# Patient Record
Sex: Male | Born: 1965 | Race: Black or African American | Hispanic: No | Marital: Married | Smoking: Never smoker
Health system: Southern US, Community
[De-identification: ages and names within clinical notes are randomized; demographics above are authoritative.]

## PROBLEM LIST (undated history)

## (undated) DIAGNOSIS — G4730 Sleep apnea, unspecified: Secondary | ICD-10-CM

## (undated) DIAGNOSIS — G4733 Obstructive sleep apnea (adult) (pediatric): Secondary | ICD-10-CM

## (undated) DIAGNOSIS — I10 Essential (primary) hypertension: Secondary | ICD-10-CM

## (undated) DIAGNOSIS — R519 Headache, unspecified: Secondary | ICD-10-CM

## (undated) DIAGNOSIS — M1990 Unspecified osteoarthritis, unspecified site: Secondary | ICD-10-CM

## (undated) DIAGNOSIS — R51 Headache: Secondary | ICD-10-CM

## (undated) DIAGNOSIS — Z9989 Dependence on other enabling machines and devices: Secondary | ICD-10-CM

## (undated) DIAGNOSIS — N189 Chronic kidney disease, unspecified: Secondary | ICD-10-CM

## (undated) DIAGNOSIS — E119 Type 2 diabetes mellitus without complications: Secondary | ICD-10-CM

## (undated) HISTORY — DX: Headache: R51

## (undated) HISTORY — DX: Sleep apnea, unspecified: G47.30

## (undated) HISTORY — DX: Morbid (severe) obesity due to excess calories: E66.01

## (undated) HISTORY — DX: Type 2 diabetes mellitus without complications: E11.9

## (undated) HISTORY — PX: VASECTOMY: SHX75

## (undated) HISTORY — PX: MENISCUS REPAIR: SHX5179

## (undated) HISTORY — DX: Essential (primary) hypertension: I10

## (undated) HISTORY — DX: Headache, unspecified: R51.9

## (undated) HISTORY — DX: Obstructive sleep apnea (adult) (pediatric): G47.33

## (undated) HISTORY — DX: Chronic kidney disease, unspecified: N18.9

## (undated) HISTORY — DX: Dependence on other enabling machines and devices: Z99.89

## (undated) HISTORY — PX: OTHER SURGICAL HISTORY: SHX169

---

## 2011-09-05 DIAGNOSIS — G4733 Obstructive sleep apnea (adult) (pediatric): Secondary | ICD-10-CM

## 2011-09-05 HISTORY — DX: Obstructive sleep apnea (adult) (pediatric): G47.33

## 2014-07-14 ENCOUNTER — Encounter (INDEPENDENT_AMBULATORY_CARE_PROVIDER_SITE_OTHER): Payer: Self-pay

## 2014-07-14 ENCOUNTER — Ambulatory Visit (INDEPENDENT_AMBULATORY_CARE_PROVIDER_SITE_OTHER): Payer: BC Managed Care – PPO | Admitting: Internal Medicine

## 2014-07-14 ENCOUNTER — Encounter: Payer: Self-pay | Admitting: Internal Medicine

## 2014-07-14 DIAGNOSIS — I10 Essential (primary) hypertension: Secondary | ICD-10-CM

## 2014-07-14 DIAGNOSIS — Z13220 Encounter for screening for lipoid disorders: Secondary | ICD-10-CM

## 2014-07-14 HISTORY — DX: Morbid (severe) obesity due to excess calories: E66.01

## 2014-07-14 LAB — COMPREHENSIVE METABOLIC PANEL
ALBUMIN: 3.4 g/dL — AB (ref 3.5–5.2)
ALT: 43 U/L (ref 0–53)
AST: 31 U/L (ref 0–37)
Alkaline Phosphatase: 75 U/L (ref 39–117)
BUN: 15 mg/dL (ref 6–23)
CALCIUM: 9.7 mg/dL (ref 8.4–10.5)
CHLORIDE: 104 meq/L (ref 96–112)
CO2: 31 mEq/L (ref 19–32)
Creatinine, Ser: 1.3 mg/dL (ref 0.4–1.5)
GFR: 78.54 mL/min (ref >60.00)
GLUCOSE: 161 mg/dL — AB (ref 70–99)
POTASSIUM: 4.1 meq/L (ref 3.5–5.1)
Sodium: 141 mEq/L (ref 135–145)
TOTAL PROTEIN: 7.7 g/dL (ref 6.0–8.3)
Total Bilirubin: 0.4 mg/dL (ref 0.2–1.2)

## 2014-07-14 LAB — CBC
HCT: 44.3 % (ref 39.0–52.0)
Hemoglobin: 14.5 g/dL (ref 13.0–17.0)
MCHC: 32.7 g/dL (ref 30.0–36.0)
MCV: 86.2 fl (ref 78.0–100.0)
PLATELETS: 154 10*3/uL (ref 150.0–400.0)
RBC: 5.14 Mil/uL (ref 4.22–5.81)
RDW: 16.1 % — ABNORMAL HIGH (ref 11.5–15.5)
WBC: 6.4 10*3/uL (ref 4.0–10.5)

## 2014-07-14 LAB — TSH: TSH: 2.65 u[IU]/mL (ref 0.35–4.50)

## 2014-07-14 LAB — LIPID PANEL
CHOLESTEROL: 140 mg/dL (ref 0–200)
HDL: 38.6 mg/dL — ABNORMAL LOW (ref >39.00)
LDL Cholesterol: 88 mg/dL (ref 0–99)
NONHDL: 101.4
Total CHOL/HDL Ratio: 4
Triglycerides: 67 mg/dL (ref 0.0–149.0)
VLDL: 13.4 mg/dL (ref 0.0–40.0)

## 2014-07-14 LAB — HEMOGLOBIN A1C: Hgb A1c MFr Bld: 7.8 % — ABNORMAL HIGH (ref 4.6–6.5)

## 2014-07-14 NOTE — Progress Notes (Signed)
Pre visit review using our clinic review tool, if applicable. No additional management support is needed unless otherwise documented below in the visit note. 

## 2014-07-14 NOTE — Assessment & Plan Note (Addendum)
Encouraged him to try water aerobics to get a good workout without complicating his knee pain Will check A1C and TSH today

## 2014-07-14 NOTE — Progress Notes (Signed)
HPI  Pt presents to the clinic today to establish care. He recently moved from [place].  Flu: never Tetanus: 2009 Vision Screening: yearly- 12/2013 Dentist: Every 4 months  Sleep Apnea: He does have a CPAP machine. He does think it is due to be serviced because the mask is not fitting as well as it had previous. He is not sleeping as well. He has not established care with a pulmonologist in the area.  HTN: BP is very elevated today. He reports he does not check his blood pressure at home but does have a brand new monitor. He does occasionally have headaches, but reports these occur in the afternoon and he thinks they are stress related. He is taking his Hyzaar as directed.  Left knee pain: This has been a chronic issue for him. He was being seen by orthopedics in the past and getting steroid injections every three months. He has not yet established with a orthopedic doctor in the area.  Past Medical History  Diagnosis Date  . Chicken pox   . Frequent headaches   . Hypertension   . Sleep apnea     Current Outpatient Prescriptions  Medication Sig Dispense Refill  . losartan-hydrochlorothiazide (HYZAAR) 100-25 MG per tablet Take 1 tablet by mouth daily.    . Multiple Vitamin (MULTIVITAMIN) tablet Take 1 tablet by mouth daily.    . Omega-3 Fatty Acids (FISH OIL) 1000 MG CAPS Take 1 capsule by mouth daily.     No current facility-administered medications for this visit.    No Known Allergies  Family History  Problem Relation Age of Onset  . Hypertension Mother   . Diabetes Mother   . Cancer Maternal Grandmother     Lung  . Stroke Neg Hx     History   Social History  . Marital Status: Married    Spouse Name: N/A    Number of Children: N/A  . Years of Education: N/A   Occupational History  . Not on file.   Social History Main Topics  . Smoking status: Never Smoker   . Smokeless tobacco: Never Used  . Alcohol Use: 0.0 oz/week    0 Not specified per week     Comment: never   . Drug Use: No  . Sexual Activity: Yes   Other Topics Concern  . Not on file   Social History Narrative  . No narrative on file    ROS:  Constitutional: Denies fever, malaise, fatigue, headache or abrupt weight changes.  Respiratory: Denies difficulty breathing, shortness of breath, cough or sputum production.   Cardiovascular: Denies chest pain, chest tightness, palpitations or swelling in the hands or feet.  Musculoskeletal: Pt reports left knee pain. Denies decrease in range of motion, difficulty with gait, muscle pain or joint swelling.   No other specific complaints in a complete review of systems (except as listed in HPI above).  PE:  BP 164/100 mmHg  Pulse 72  Temp(Src) 98.2 F (36.8 C) (Oral)  Ht 5' 5.5" (1.664 m)  Wt 330 lb (149.687 kg)  BMI 54.06 kg/m2  SpO2 98% Wt Readings from Last 3 Encounters:  07/14/14 330 lb (149.687 kg)    General: Appears his stated age, obese but well developed, well nourished in NAD. Cardiovascular: Normal rate and rhythm. Diminished S1,S2 noted.  No murmur, rubs or gallops noted.  Pulmonary/Chest: Normal effort and positive vesicular breath sounds. No respiratory distress. No wheezes, rales or ronchi noted.  Musculoskeletal: Normal flexion and extension of the  left knee. No instability noted. Strength 5/5 BLE. No difficulty with gait.    Assessment and Plan:  Health Maintenance:  He declines flu vaccine today Will check lipid profile today  Left knee pain:  ? Arthritis Worse due to size Encouraged him to work on weight loss Will request records from previous orthopedic in preparation to refer him to an orthopedic in the area.

## 2014-07-14 NOTE — Assessment & Plan Note (Signed)
Uncontrolled Worse due to weight Will check CBC and CMET today If normal will add CCB to Hyzaar

## 2014-07-14 NOTE — Patient Instructions (Signed)

## 2014-07-15 ENCOUNTER — Telehealth: Payer: Self-pay | Admitting: Internal Medicine

## 2014-07-15 NOTE — Telephone Encounter (Signed)
emmi emailed °

## 2014-07-21 ENCOUNTER — Encounter: Payer: Self-pay | Admitting: Internal Medicine

## 2014-07-21 ENCOUNTER — Ambulatory Visit (INDEPENDENT_AMBULATORY_CARE_PROVIDER_SITE_OTHER): Payer: BC Managed Care – PPO | Admitting: Internal Medicine

## 2014-07-21 VITALS — BP 136/84 | HR 79 | Temp 98.0°F | Wt 332.0 lb

## 2014-07-21 DIAGNOSIS — E119 Type 2 diabetes mellitus without complications: Secondary | ICD-10-CM

## 2014-07-21 HISTORY — DX: Type 2 diabetes mellitus without complications: E11.9

## 2014-07-21 NOTE — Progress Notes (Signed)
Subjective:    Patient ID: Devon Rios, male    DOB: 1965-09-16, 48 y.o.   MRN: [id number]  HPI  Pt presents to the clinic today to discuss treatment of DM2. He was recently diagnosed with an A1C of 7.8%. He does have a family history of DM2 but has never been told that he has diabetes. His is obese and has HTN. Cholesterol is diet controlled with a LDL of 88.  He reports that since he moved here, his diet had not been good. He has also has been drinking more beer because of the stress of moving.  Review of Systems  Past Medical History  Diagnosis Date  . Chicken pox   . Frequent headaches   . Hypertension   . Sleep apnea     Current Outpatient Prescriptions  Medication Sig Dispense Refill  . losartan-hydrochlorothiazide (HYZAAR) 100-25 MG per tablet Take 1 tablet by mouth daily.    . Multiple Vitamin (MULTIVITAMIN) tablet Take 1 tablet by mouth daily.    . Omega-3 Fatty Acids (FISH OIL) 1000 MG CAPS Take 1 capsule by mouth daily.     No current facility-administered medications for this visit.    No Known Allergies  Family History  Problem Relation Age of Onset  . Hypertension Mother   . Diabetes Mother   . Cancer Maternal Grandmother     Lung  . Stroke Neg Hx     History   Social History  . Marital Status: Married    Spouse Name: N/A    Number of Children: N/A  . Years of Education: N/A   Occupational History  . Not on file.   Social History Main Topics  . Smoking status: Never Smoker   . Smokeless tobacco: Never Used  . Alcohol Use: 0.0 oz/week    0 Not specified per week     Comment: never  . Drug Use: No  . Sexual Activity: Yes   Other Topics Concern  . Not on file   Social History Narrative     Constitutional: Denies fever, malaise, fatigue, headache or abrupt weight changes.  Respiratory: Denies difficulty breathing, shortness of breath, cough or sputum production.   Cardiovascular: Denies chest pain, chest tightness, palpitations or  swelling in the hands or feet.  Gastrointestinal: Denies polydipsia. Denies abdominal pain, bloating, constipation, diarrhea or blood in the stool.  GU: Denies polyuria. Denies urgency, frequency, pain with urination, burning sensation, blood in urine, odor or discharge. Skin: Denies redness, rashes, lesions or ulcercations.   No other specific complaints in a complete review of systems (except as listed in HPI above).     Objective:   Physical Exam  BP 136/84 mmHg  Pulse 79  Temp(Src) 98 F (36.7 C) (Oral)  Wt 332 lb (150.594 kg)  SpO2 98% Wt Readings from Last 3 Encounters:  07/21/14 332 lb (150.594 kg)  07/14/14 330 lb (149.687 kg)    General: Appears his stated age, obese but well developed, well nourished in NAD. Skin: Warm, dry and intact. No rashes, lesions or ulcerations noted. Cardiovascular: Normal rate and rhythm. S1,S2 noted.  No murmur, rubs or gallops noted.  Pulmonary/Chest: Normal effort and positive vesicular breath sounds. No respiratory distress. No wheezes, rales or ronchi noted.  Neurological: Alert and oriented.      BMET    Component Value Date/Time   NA 141 07/14/2014 1011   K 4.1 07/14/2014 1011   CL 104 07/14/2014 1011   CO2 31 07/14/2014 1011  GLUCOSE 161* 07/14/2014 1011   BUN 15 07/14/2014 1011   CREATININE 1.3 07/14/2014 1011   CALCIUM 9.7 07/14/2014 1011    Lipid Panel     Component Value Date/Time   CHOL 140 07/14/2014 1011   TRIG 67.0 07/14/2014 1011   HDL 38.60* 07/14/2014 1011   CHOLHDL 4 07/14/2014 1011   VLDL 13.4 07/14/2014 1011   LDLCALC 88 07/14/2014 1011    CBC    Component Value Date/Time   WBC 6.4 07/14/2014 1011   RBC 5.14 07/14/2014 1011   HGB 14.5 07/14/2014 1011   HCT 44.3 07/14/2014 1011   PLT 154.0 07/14/2014 1011   MCV 86.2 07/14/2014 1011   MCHC 32.7 07/14/2014 1011   RDW 16.1* 07/14/2014 1011    Hgb A1C Lab Results  Component Value Date   HGBA1C 7.8* 07/14/2014         Assessment & Plan:

## 2014-07-21 NOTE — Patient Instructions (Signed)
Diabetes and Standards of Medical Care Diabetes is complicated. You may find that your diabetes team includes a dietitian, nurse, diabetes educator, eye doctor, and more. To help everyone know what is going on and to help you get the care you deserve, the following schedule of care was developed to help keep you on track. Below are the tests, exams, vaccines, medicines, education, and plans you will need. HbA1c test This test shows how well you have controlled your glucose over the past 2-3 months. It is used to see if your diabetes management plan needs to be adjusted.   It is performed at least 2 times a year if you are meeting treatment goals.  It is performed 4 times a year if therapy has changed or if you are not meeting treatment goals. Blood pressure test  This test is performed at every routine medical visit. The goal is less than 140/90 mm Hg for most people, but 130/80 mm Hg in some cases. Ask your health care provider about your goal. Dental exam  Follow up with the dentist regularly. Eye exam  If you are diagnosed with type 1 diabetes as a child, get an exam upon reaching the age of 37 years or older and have had diabetes for 3-5 years. Yearly eye exams are recommended after that initial eye exam.  If you are diagnosed with type 1 diabetes as an adult, get an exam within 5 years of diagnosis and then yearly.  If you are diagnosed with type 2 diabetes, get an exam as soon as possible after the diagnosis and then yearly. Foot care exam  Visual foot exams are performed at every routine medical visit. The exams check for cuts, injuries, or other problems with the feet.  A comprehensive foot exam should be done yearly. This includes visual inspection as well as assessing foot pulses and testing for loss of sensation.  Check your feet nightly for cuts, injuries, or other problems with your feet. Tell your health care provider if anything is not healing. Kidney function test (urine  microalbumin)  This test is performed once a year.  Type 1 diabetes: The first test is performed 5 years after diagnosis.  Type 2 diabetes: The first test is performed at the time of diagnosis.  A serum creatinine and estimated glomerular filtration rate (eGFR) test is done once a year to assess the level of chronic kidney disease (CKD), if present. Lipid profile (cholesterol, HDL, LDL, triglycerides)  Performed every 5 years for most people.  The goal for LDL is less than 100 mg/dL. If you are at high risk, the goal is less than 70 mg/dL.  The goal for HDL is 40 mg/dL-50 mg/dL for men and 50 mg/dL-60 mg/dL for women. An HDL cholesterol of 60 mg/dL or higher gives some protection against heart disease.  The goal for triglycerides is less than 150 mg/dL. Influenza vaccine, pneumococcal vaccine, and hepatitis B vaccine  The influenza vaccine is recommended yearly.  It is recommended that people with diabetes who are over 24 years old get the pneumonia vaccine. In some cases, two separate shots may be given. Ask your health care provider if your pneumonia vaccination is up to date.  The hepatitis B vaccine is also recommended for adults with diabetes. Diabetes self-management education  Education is recommended at diagnosis and ongoing as needed. Treatment plan  Your treatment plan is reviewed at every medical visit. Document Released: 06/18/2009 Document Revised: 01/05/2014 Document Reviewed: 01/21/2013 [facility] Patient Information 2015 [place],  [employer]. This information is not intended to replace advice given to you by your health care provider. Make sure you discuss any questions you have with your health care provider.  

## 2014-07-21 NOTE — Assessment & Plan Note (Addendum)
A1C 7.8 Foot exam today Discussed starting medication versus lifestyle changes He would like to try 3 months of lifestyle modification first Discussed foods to avoid, how exercise can lower your blood sugars Handout given on Diabetes and standards of medical care  RTC in 3 months to reevaluate, A1C and microalbumin 1 week prior to appt

## 2014-07-21 NOTE — Progress Notes (Signed)
Pre visit review using our clinic review tool, if applicable. No additional management support is needed unless otherwise documented below in the visit note. 

## 2014-09-10 ENCOUNTER — Ambulatory Visit: Payer: Self-pay | Admitting: Internal Medicine

## 2014-09-11 ENCOUNTER — Ambulatory Visit (INDEPENDENT_AMBULATORY_CARE_PROVIDER_SITE_OTHER): Payer: BLUE CROSS/BLUE SHIELD | Admitting: Family Medicine

## 2014-09-11 ENCOUNTER — Encounter: Payer: Self-pay | Admitting: Family Medicine

## 2014-09-11 VITALS — BP 148/92 | HR 98 | Temp 98.8°F | Wt 338.5 lb

## 2014-09-11 DIAGNOSIS — J019 Acute sinusitis, unspecified: Secondary | ICD-10-CM

## 2014-09-11 DIAGNOSIS — H66001 Acute suppurative otitis media without spontaneous rupture of ear drum, right ear: Secondary | ICD-10-CM

## 2014-09-11 DIAGNOSIS — H6691 Otitis media, unspecified, right ear: Secondary | ICD-10-CM | POA: Insufficient documentation

## 2014-09-11 DIAGNOSIS — Z9989 Dependence on other enabling machines and devices: Secondary | ICD-10-CM

## 2014-09-11 DIAGNOSIS — G4733 Obstructive sleep apnea (adult) (pediatric): Secondary | ICD-10-CM | POA: Insufficient documentation

## 2014-09-11 MED ORDER — AMOXICILLIN 500 MG PO CAPS: 1000.0000 mg | ORAL_CAPSULE | Freq: Two times a day (BID) | ORAL | Status: DC

## 2014-09-11 MED ORDER — GUAIFENESIN-CODEINE 100-10 MG/5ML PO SYRP: 5.0000 mL | ORAL_SOLUTION | Freq: Every evening | ORAL | Status: DC | PRN

## 2014-09-11 NOTE — Progress Notes (Signed)
Pre visit review using our clinic review tool, if applicable. No additional management support is needed unless otherwise documented below in the visit note. 

## 2014-09-11 NOTE — Assessment & Plan Note (Addendum)
Anticipate viral but with R AOM will cover with amoxicillin. Pt agrees with plan. Supportive care as per instructions.  Use mucinex and cheratussin. Update if not improving with abx course. Significant comorbidities - morbid obesity, OSA, DM.

## 2014-09-11 NOTE — Progress Notes (Signed)
BP 148/92 mmHg  Pulse 98  Temp(Src) 98.8 F (37.1 C) (Oral)  Wt 338 lb 8 oz (153.543 kg)  SpO2 96%   CC: cough  Subjective:    Patient ID: Devon Rios, male    DOB: Jun 27, 1966, 49 y.o.   MRN: [id number]  HPI: Devon Rios is a 49 y.o. male presenting on 09/11/2014 for Sinusitis   6d h/o head congestion with cough, progressively worsening over the past week.  Endorses sinus pressure, worsening productive cough of thick yellow mucous, trouble sleeping 2/2 cough. Raspy voice. Rhinorrhea, significant ear pressure R>L.  No fevers/chills, body aches, sore throat, tooth pain, abd pain, dyspnea/wheezing or chest pain.   No sick contacts at home. + sick contacts at work. No smokers at home.  No h/o asthma, COPD. + OSA, on CPAP So far has tried dayquil, advil cold and sinus  H/o morbid obesity, HTN ,OSA, T2DM.  Relevant past medical, surgical, family and social history reviewed and updated as indicated. Interim medical history since our last visit reviewed. Allergies and medications reviewed and updated. Current Outpatient Prescriptions on File Prior to Visit  Medication Sig  . losartan-hydrochlorothiazide (HYZAAR) 100-25 MG per tablet Take 1 tablet by mouth daily.  . Multiple Vitamin (MULTIVITAMIN) tablet Take 1 tablet by mouth daily.  . Omega-3 Fatty Acids (FISH OIL) 1000 MG CAPS Take 1 capsule by mouth daily.   No current facility-administered medications on file prior to visit.    Review of Systems Per HPI unless specifically indicated above     Objective:    BP 148/92 mmHg  Pulse 98  Temp(Src) 98.8 F (37.1 C) (Oral)  Wt 338 lb 8 oz (153.543 kg)  SpO2 96%  Wt Readings from Last 3 Encounters:  09/11/14 338 lb 8 oz (153.543 kg)  07/21/14 332 lb (150.594 kg)  07/14/14 330 lb (149.687 kg)    Physical Exam  Constitutional: He appears well-developed and well-nourished. No distress.  Morbidly obese  HENT:  Head: Normocephalic and atraumatic.  Right Ear: Hearing,  external ear and ear canal normal.  Left Ear: Hearing, external ear and ear canal normal.  Nose: Mucosal edema (L>R) present. No rhinorrhea. Right sinus exhibits maxillary sinus tenderness. Right sinus exhibits no frontal sinus tenderness. Left sinus exhibits maxillary sinus tenderness. Left sinus exhibits no frontal sinus tenderness.  Mouth/Throat: Uvula is midline, oropharynx is clear and moist and mucous membranes are normal. No oropharyngeal exudate, posterior oropharyngeal edema, posterior oropharyngeal erythema or tonsillar abscesses.  Injected L TM R TM erythematous and bulging, loss of light reflex, yellow fluid behind TM  Eyes: Conjunctivae and EOM are normal. Pupils are equal, round, and reactive to light. No scleral icterus.  Neck: Normal range of motion. Neck supple.  Cardiovascular: Normal rate, regular rhythm, normal heart sounds and intact distal pulses.   No murmur heard. Pulmonary/Chest: Effort normal and breath sounds normal. No respiratory distress. He has no wheezes. He has no rales.  Lymphadenopathy:    He has no cervical adenopathy.  Skin: Skin is warm and dry. No rash noted.  Nursing note and vitals reviewed.  Results for orders placed or performed in visit on 07/14/14  CBC  Result Value Ref Range   WBC 6.4 4.0 - 10.5 K/uL   RBC 5.14 4.22 - 5.81 Mil/uL   Platelets 154.0 150.0 - 400.0 K/uL   Hemoglobin 14.5 13.0 - 17.0 g/dL   HCT 44.3 39.0 - 52.0 %   MCV 86.2 78.0 - 100.0 fl   MCHC 32.7  30.0 - 36.0 g/dL   RDW 16.1 (H) 11.5 - 15.5 %  Comprehensive metabolic panel  Result Value Ref Range   Sodium 141 135 - 145 mEq/L   Potassium 4.1 3.5 - 5.1 mEq/L   Chloride 104 96 - 112 mEq/L   CO2 31 19 - 32 mEq/L   Glucose, Bld 161 (H) 70 - 99 mg/dL   BUN 15 6 - 23 mg/dL   Creatinine, Ser 1.3 0.4 - 1.5 mg/dL   Total Bilirubin 0.4 0.2 - 1.2 mg/dL   Alkaline Phosphatase 75 39 - 117 U/L   AST 31 0 - 37 U/L   ALT 43 0 - 53 U/L   Total Protein 7.7 6.0 - 8.3 g/dL   Albumin  3.4 (L) 3.5 - 5.2 g/dL   Calcium 9.7 8.4 - 10.5 mg/dL   GFR 78.54 >60.00 mL/min  TSH  Result Value Ref Range   TSH 2.65 0.35 - 4.50 uIU/mL  Lipid panel  Result Value Ref Range   Cholesterol 140 0 - 200 mg/dL   Triglycerides 67.0 0.0 - 149.0 mg/dL   HDL 38.60 (L) >39.00 mg/dL   VLDL 13.4 0.0 - 40.0 mg/dL   LDL Cholesterol 88 0 - 99 mg/dL   Total CHOL/HDL Ratio 4    NonHDL 101.40   Hemoglobin A1c  Result Value Ref Range   Hgb A1c MFr Bld 7.8 (H) 4.6 - 6.5 %      Assessment & Plan:   Problem List Items Addressed This Visit    Right otitis media - Primary    Treat with amoxicillin 10d course.    Relevant Medications      amoxicillin (AMOXIL) capsule   Acute sinusitis    Anticipate viral but with R AOM will cover with amoxicillin. Pt agrees with plan. Supportive care as per instructions.  Use mucinex and cheratussin. Update if not improving with abx course. Significant comorbidities - morbid obesity, OSA, DM.    Relevant Medications      guaiFENesin-codeine (ROBITUSSIN AC) syrup 100-10 mg/32mL      amoxicillin (AMOXIL) capsule       Follow up plan: Return if symptoms worsen or fail to improve.

## 2014-09-11 NOTE — Assessment & Plan Note (Signed)
Treat with amoxicillin 10d course.

## 2014-09-11 NOTE — Patient Instructions (Signed)
You have sinusitis that has led to R ear infection. Treat with high dose amoxicillin for next 10 days.  May use codeine cough syrup at night time. Use plain mucinex with plenty of water to help mobilize mucous. Push fluids and rest and let us know if not improving on antibiotic.

## 2014-09-29 ENCOUNTER — Ambulatory Visit (INDEPENDENT_AMBULATORY_CARE_PROVIDER_SITE_OTHER): Payer: BLUE CROSS/BLUE SHIELD | Admitting: Internal Medicine

## 2014-09-29 ENCOUNTER — Encounter: Payer: Self-pay | Admitting: Internal Medicine

## 2014-09-29 VITALS — BP 138/102 | HR 76 | Temp 98.2°F | Wt 335.0 lb

## 2014-09-29 DIAGNOSIS — H66001 Acute suppurative otitis media without spontaneous rupture of ear drum, right ear: Secondary | ICD-10-CM

## 2014-09-29 DIAGNOSIS — R059 Cough, unspecified: Secondary | ICD-10-CM

## 2014-09-29 DIAGNOSIS — R05 Cough: Secondary | ICD-10-CM

## 2014-09-29 MED ORDER — CEFDINIR 300 MG PO CAPS: 300.0000 mg | ORAL_CAPSULE | Freq: Two times a day (BID) | ORAL | Status: DC

## 2014-09-29 NOTE — Progress Notes (Signed)
Subjective:    Patient ID: Devon Rios, male    DOB: [birth year], 49 y.o.   MRN: [id number]  HPI  Pt presents to the clinic today with c/o continued ear pain. He reports he saw Dr. Lynnae Sandhoff 1/816 for the same. He was diagnosed with viral sinusitis and right otitis media. He was treated with Amoxicillin and Robitussin with Codeine. He reports good improvement in his sinus symptoms but continues to have right ear pain. He has had some associated hearing loss in the right ear. He denies fever, chills or body aches.  Review of Systems  Past Medical History  Diagnosis Date  . Chicken pox   . Frequent headaches   . Hypertension   . OSA on CPAP 2013  . Severe obesity (BMI >= 40) 07/14/2014  . DM2 (diabetes mellitus, type 2) 07/21/2014    Current Outpatient Prescriptions  Medication Sig Dispense Refill  . losartan-hydrochlorothiazide (HYZAAR) 100-25 MG per tablet Take 1 tablet by mouth daily.    . Multiple Vitamin (MULTIVITAMIN) tablet Take 1 tablet by mouth daily.    . Omega-3 Fatty Acids (FISH OIL) 1000 MG CAPS Take 1 capsule by mouth daily.     No current facility-administered medications for this visit.    No Known Allergies  Family History  Problem Relation Age of Onset  . Hypertension Mother   . Diabetes Mother   . Cancer Maternal Grandmother     Lung  . Stroke Neg Hx     History   Social History  . Marital Status: Married    Spouse Name: N/A    Number of Children: N/A  . Years of Education: N/A   Occupational History  . Not on file.   Social History Main Topics  . Smoking status: Never Smoker   . Smokeless tobacco: Never Used  . Alcohol Use: 0.0 oz/week    0 Not specified per week     Comment: never  . Drug Use: No  . Sexual Activity: Yes   Other Topics Concern  . Not on file   Social History Narrative     Constitutional: Denies fever, malaise, fatigue, headache or abrupt weight changes.  HEENT: Pt reports ear pain. Denies eye pain, eye redness,  ringing in the ears, wax buildup, runny nose, nasal congestion, bloody nose, or sore throat. Respiratory: Pt reports cough. Denies difficulty breathing, shortness of breath, cough or sputum production.   Cardiovascular: Denies chest pain, chest tightness, palpitations or swelling in the hands or feet.  Neurological: Denies dizziness, difficulty with memory, difficulty with speech or problems with balance and coordination.   No other specific complaints in a complete review of systems (except as listed in HPI above).     Objective:   Physical Exam  BP 138/102 mmHg  Pulse 76  Temp(Src) 98.2 F (36.8 C) (Oral)  Wt 335 lb (151.955 kg)  SpO2 98% Wt Readings from Last 3 Encounters:  09/29/14 335 lb (151.955 kg)  09/11/14 338 lb 8 oz (153.543 kg)  07/21/14 332 lb (150.594 kg)    General: Appears his stated age, obese in NAD. Skin: Warm, dry and intact. No rashes, lesions or ulcerations noted. HEENT: Head: normal shape and size; Eyes: sclera white, no icterus, conjunctiva pink, PERRLA and EOMs intact; Right Ears: Tm's red and intact, distorted light reflex, + effusion; Throat/Mouth: Teeth present, mucosa pink and moist, no exudate, lesions or ulcerations noted.  Cardiovascular: Normal rate and rhythm. S1,S2 noted.  No murmur, rubs or gallops noted.  Pulmonary/Chest: Normal effort and positive vesicular breath sounds. No respiratory distress. No wheezes, rales or ronchi noted.  Neurological: Alert and oriented.    BMET    Component Value Date/Time   NA 141 07/14/2014 1011   K 4.1 07/14/2014 1011   CL 104 07/14/2014 1011   CO2 31 07/14/2014 1011   GLUCOSE 161* 07/14/2014 1011   BUN 15 07/14/2014 1011   CREATININE 1.3 07/14/2014 1011   CALCIUM 9.7 07/14/2014 1011    Lipid Panel     Component Value Date/Time   CHOL 140 07/14/2014 1011   TRIG 67.0 07/14/2014 1011   HDL 38.60* 07/14/2014 1011   CHOLHDL 4 07/14/2014 1011   VLDL 13.4 07/14/2014 1011   LDLCALC 88 07/14/2014 1011     CBC    Component Value Date/Time   WBC 6.4 07/14/2014 1011   RBC 5.14 07/14/2014 1011   HGB 14.5 07/14/2014 1011   HCT 44.3 07/14/2014 1011   PLT 154.0 07/14/2014 1011   MCV 86.2 07/14/2014 1011   MCHC 32.7 07/14/2014 1011   RDW 16.1* 07/14/2014 1011    Hgb A1C Lab Results  Component Value Date   HGBA1C 7.8* 07/14/2014         Assessment & Plan:   Acute right otitis media, unresolved:  eRx for Omnicef BID x 10 days Try some Flonase each nostril daily x 2 weeks If no improvement, will refer to ENT  Cough, s/p viral sinusitis:  Continue Delsym OTC  RTC as needed or if symptoms persist or worsen

## 2014-09-29 NOTE — Patient Instructions (Signed)

## 2014-10-05 ENCOUNTER — Telehealth: Payer: Self-pay | Admitting: Internal Medicine

## 2014-10-05 NOTE — Telephone Encounter (Signed)
Pt's wife came in and dropped off forms for a cpap machine.  pts wife stated that a rx is needed and also the forms need to be filled out and faxed in along with faxing a copy of the rx.  When filled out, please fax to [phone]. Thanks

## 2014-10-06 NOTE — Telephone Encounter (Signed)
What specific equipment is needed?

## 2014-10-07 NOTE — Telephone Encounter (Signed)
Pt spouse called stating parts that are needed Mask And strap that hooks to mask Tub that holds water inside machine

## 2014-10-08 ENCOUNTER — Other Ambulatory Visit: Payer: Self-pay | Admitting: Internal Medicine

## 2014-10-08 DIAGNOSIS — Z9989 Dependence on other enabling machines and devices: Principal | ICD-10-CM

## 2014-10-08 DIAGNOSIS — Z7689 Persons encountering health services in other specified circumstances: Secondary | ICD-10-CM

## 2014-10-08 DIAGNOSIS — G4733 Obstructive sleep apnea (adult) (pediatric): Secondary | ICD-10-CM

## 2014-10-08 NOTE — Telephone Encounter (Signed)
Orders done, placed in my outbox

## 2014-10-15 ENCOUNTER — Other Ambulatory Visit (INDEPENDENT_AMBULATORY_CARE_PROVIDER_SITE_OTHER): Payer: BLUE CROSS/BLUE SHIELD

## 2014-10-15 DIAGNOSIS — E119 Type 2 diabetes mellitus without complications: Secondary | ICD-10-CM

## 2014-10-15 LAB — HEMOGLOBIN A1C: Hgb A1c MFr Bld: 8.3 % — ABNORMAL HIGH (ref 4.6–6.5)

## 2014-10-15 LAB — MICROALBUMIN / CREATININE URINE RATIO
CREATININE, U: 85.3 mg/dL
MICROALB/CREAT RATIO: 85.1 mg/g — AB (ref 0.0–30.0)
Microalb, Ur: 72.6 mg/dL — ABNORMAL HIGH (ref 0.0–1.9)

## 2014-10-22 ENCOUNTER — Encounter: Payer: Self-pay | Admitting: Internal Medicine

## 2014-10-22 ENCOUNTER — Ambulatory Visit (INDEPENDENT_AMBULATORY_CARE_PROVIDER_SITE_OTHER): Payer: BLUE CROSS/BLUE SHIELD | Admitting: Internal Medicine

## 2014-10-22 VITALS — BP 158/102 | HR 81 | Temp 98.2°F | Wt 336.0 lb

## 2014-10-22 DIAGNOSIS — I10 Essential (primary) hypertension: Secondary | ICD-10-CM

## 2014-10-22 DIAGNOSIS — G4733 Obstructive sleep apnea (adult) (pediatric): Secondary | ICD-10-CM

## 2014-10-22 DIAGNOSIS — E1165 Type 2 diabetes mellitus with hyperglycemia: Secondary | ICD-10-CM

## 2014-10-22 DIAGNOSIS — Z9989 Dependence on other enabling machines and devices: Secondary | ICD-10-CM

## 2014-10-22 NOTE — Patient Instructions (Signed)
Diabetes and Standards of Medical Care Diabetes is complicated. You may find that your diabetes team includes a dietitian, nurse, diabetes educator, eye doctor, and more. To help everyone know what is going on and to help you get the care you deserve, the following schedule of care was developed to help keep you on track. Below are the tests, exams, vaccines, medicines, education, and plans you will need. HbA1c test This test shows how well you have controlled your glucose over the past 2-3 months. It is used to see if your diabetes management plan needs to be adjusted.   It is performed at least 2 times a year if you are meeting treatment goals.  It is performed 4 times a year if therapy has changed or if you are not meeting treatment goals. Blood pressure test  This test is performed at every routine medical visit. The goal is less than 140/90 mm Hg for most people, but 130/80 mm Hg in some cases. Ask your health care provider about your goal. Dental exam  Follow up with the dentist regularly. Eye exam  If you are diagnosed with type 1 diabetes as a child, get an exam upon reaching the age of 37 years or older and have had diabetes for 3-5 years. Yearly eye exams are recommended after that initial eye exam.  If you are diagnosed with type 1 diabetes as an adult, get an exam within 5 years of diagnosis and then yearly.  If you are diagnosed with type 2 diabetes, get an exam as soon as possible after the diagnosis and then yearly. Foot care exam  Visual foot exams are performed at every routine medical visit. The exams check for cuts, injuries, or other problems with the feet.  A comprehensive foot exam should be done yearly. This includes visual inspection as well as assessing foot pulses and testing for loss of sensation.  Check your feet nightly for cuts, injuries, or other problems with your feet. Tell your health care provider if anything is not healing. Kidney function test (urine  microalbumin)  This test is performed once a year.  Type 1 diabetes: The first test is performed 5 years after diagnosis.  Type 2 diabetes: The first test is performed at the time of diagnosis.  A serum creatinine and estimated glomerular filtration rate (eGFR) test is done once a year to assess the level of chronic kidney disease (CKD), if present. Lipid profile (cholesterol, HDL, LDL, triglycerides)  Performed every 5 years for most people.  The goal for LDL is less than 100 mg/dL. If you are at high risk, the goal is less than 70 mg/dL.  The goal for HDL is 40 mg/dL-50 mg/dL for men and 50 mg/dL-60 mg/dL for women. An HDL cholesterol of 60 mg/dL or higher gives some protection against heart disease.  The goal for triglycerides is less than 150 mg/dL. Influenza vaccine, pneumococcal vaccine, and hepatitis B vaccine  The influenza vaccine is recommended yearly.  It is recommended that people with diabetes who are over 24 years old get the pneumonia vaccine. In some cases, two separate shots may be given. Ask your health care provider if your pneumonia vaccination is up to date.  The hepatitis B vaccine is also recommended for adults with diabetes. Diabetes self-management education  Education is recommended at diagnosis and ongoing as needed. Treatment plan  Your treatment plan is reviewed at every medical visit. Document Released: 06/18/2009 Document Revised: 01/05/2014 Document Reviewed: 01/21/2013 [facility] Patient Information 2015 [place],  [employer]. This information is not intended to replace advice given to you by your health care provider. Make sure you discuss any questions you have with your health care provider.  

## 2014-10-22 NOTE — Progress Notes (Signed)
Subjective:    Patient ID: Devon Rios, male    DOB: 1966/07/08, 49 y.o.   MRN: [id number]  HPI  Pt presents to the clinic today to follow up A1C. He reports he found out he had diabetes 07/21/14. At that time, he had an A1C was 7.8%. At that time, he reports he was drinking a few beers to wind down every night and soda's with every meal. He decided to not try medication and to try lifestyle changes for 3 months. He cut out beer and soda but did not make any real diet changes and he did not exercise. He has only lost 2.5 lbs in the last 3 months. His repeat A1C today in 8.3%. He is very upset about this.  Of note, his BP today is 158/102. He is taking his medications daily as directed. He reports his BP is always high in the mornings, he does not take his medication until about 9 am. He also reports he is not sleeping well, only 3-4 hours at the most even using for CPAP because his machine is outdated and his mask does not fit properly. He reports his BP at home is normally 129/85 in the early afternoon. He denies dizziness, blurred vision, chest pain or shortness of breath.  Review of Systems      Past Medical History  Diagnosis Date  . Chicken pox   . Frequent headaches   . Hypertension   . OSA on CPAP 2013  . Severe obesity (BMI >= 40) 07/14/2014  . DM2 (diabetes mellitus, type 2) 07/21/2014    Current Outpatient Prescriptions  Medication Sig Dispense Refill  . losartan-hydrochlorothiazide (HYZAAR) 100-25 MG per tablet Take 1 tablet by mouth daily.    . Multiple Vitamin (MULTIVITAMIN) tablet Take 1 tablet by mouth daily.    . Omega-3 Fatty Acids (FISH OIL) 1000 MG CAPS Take 1 capsule by mouth daily.     No current facility-administered medications for this visit.    No Known Allergies  Family History  Problem Relation Age of Onset  . Hypertension Mother   . Diabetes Mother   . Cancer Maternal Grandmother     Lung  . Stroke Neg Hx     History   Social History  .  Marital Status: Married    Spouse Name: N/A  . Number of Children: N/A  . Years of Education: N/A   Occupational History  . Not on file.   Social History Main Topics  . Smoking status: Never Smoker   . Smokeless tobacco: Never Used  . Alcohol Use: 0.0 oz/week    0 Standard drinks or equivalent per week     Comment: never  . Drug Use: No  . Sexual Activity: Yes   Other Topics Concern  . Not on file   Social History Narrative     Constitutional: Denies fever, malaise, fatigue, headache or abrupt weight changes.  HEENT: Denies eye pain, eye redness, ear pain, ringing in the ears, wax buildup, runny nose, nasal congestion, bloody nose, or sore throat. Respiratory: Denies difficulty breathing, shortness of breath, cough or sputum production.   Cardiovascular: Denies chest pain, chest tightness, palpitations or swelling in the hands or feet.  Skin: Denies redness, rashes, lesions or ulcercations.  Neurological: Denies dizziness, difficulty with memory, difficulty with speech or problems with balance and coordination.   No other specific complaints in a complete review of systems (except as listed in HPI above).  Objective:   Physical Exam  BP 158/102 mmHg  Pulse 81  Temp(Src) 98.2 F (36.8 C) (Oral)  Wt 336 lb (152.409 kg)  SpO2 98% Wt Readings from Last 3 Encounters:  10/22/14 336 lb (152.409 kg)  09/29/14 335 lb (151.955 kg)  09/11/14 338 lb 8 oz (153.543 kg)    General: Appears his stated age, obese in NAD. Skin: Warm, dry and intact. No rashes, lesions or ulcerations noted. HEENT: Head: normal shape and size; Eyes: sclera white, no icterus, conjunctiva pink, PERRLA and EOMs intact; Cardiovascular: Normal rate and rhythm. S1,S2 noted.  No murmur, rubs or gallops noted. No JVD or BLE edema. No carotid bruits noted. Pulmonary/Chest: Normal effort and positive vesicular breath sounds. No respiratory distress. No wheezes, rales or ronchi noted.   Neurological: Alert  and oriented.  Psychiatric: Mood- he is visibly upset and does not understand how his level is higher than last time when he cut out soda and beer.    BMET    Component Value Date/Time   NA 141 07/14/2014 1011   K 4.1 07/14/2014 1011   CL 104 07/14/2014 1011   CO2 31 07/14/2014 1011   GLUCOSE 161* 07/14/2014 1011   BUN 15 07/14/2014 1011   CREATININE 1.3 07/14/2014 1011   CALCIUM 9.7 07/14/2014 1011    Lipid Panel     Component Value Date/Time   CHOL 140 07/14/2014 1011   TRIG 67.0 07/14/2014 1011   HDL 38.60* 07/14/2014 1011   CHOLHDL 4 07/14/2014 1011   VLDL 13.4 07/14/2014 1011   LDLCALC 88 07/14/2014 1011    CBC    Component Value Date/Time   WBC 6.4 07/14/2014 1011   RBC 5.14 07/14/2014 1011   HGB 14.5 07/14/2014 1011   HCT 44.3 07/14/2014 1011   PLT 154.0 07/14/2014 1011   MCV 86.2 07/14/2014 1011   MCHC 32.7 07/14/2014 1011   RDW 16.1* 07/14/2014 1011    Hgb A1C Lab Results  Component Value Date   HGBA1C 8.3* 10/15/2014        Assessment & Plan:

## 2014-10-22 NOTE — Assessment & Plan Note (Signed)
Not controlled but reports it is lower at home He is visibly upset today as well Will continue Hyzaar for now When he sets up his next appt- will request an afternoon appt to verify that his blood pressure is lower in the afternoons If remains high, will add Norvasc

## 2014-10-22 NOTE — Assessment & Plan Note (Signed)
Weight has been stable Encouraged him to work on diet and exercise

## 2014-10-22 NOTE — Progress Notes (Signed)
Pre visit review using our clinic review tool, if applicable. No additional management support is needed unless otherwise documented below in the visit note. 

## 2014-10-22 NOTE — Assessment & Plan Note (Signed)
Rx given today for new mask, headgear and tubing If still having trouble with the fit of the CPAP, will refer to pulmonology

## 2014-10-22 NOTE — Assessment & Plan Note (Addendum)
He is having trouble accepting his diagnosis Encouraged him to start low carb diet and exercise 3 times per week Referral to diabetes education and nutrition placed He absolutely refuses to start medication He declines flu and pneumonia vaccines Advised him to make a diabetic eye exam ASAP  Will repeat A1C in 3 months

## 2014-10-24 ENCOUNTER — Telehealth: Payer: Self-pay | Admitting: Internal Medicine

## 2014-10-24 NOTE — Telephone Encounter (Signed)
emmi emailed °

## 2014-11-03 ENCOUNTER — Ambulatory Visit
Admit: 2014-11-03 | Disposition: A | Discharge status: Home / Self Care | Payer: Self-pay | Attending: Internal Medicine | Admitting: Internal Medicine

## 2014-11-05 ENCOUNTER — Telehealth: Payer: Self-pay

## 2014-11-05 MED ORDER — GLUCOSE BLOOD VI STRP: 1.0000 | ORAL_STRIP | Freq: Two times a day (BID) | Status: DC

## 2014-11-05 MED ORDER — ONETOUCH DELICA LANCETS 33G MISC: 1.0000 | Freq: Two times a day (BID) | Status: AC

## 2014-11-05 NOTE — Telephone Encounter (Signed)
History reviewed.  He does have diabetes.  Ok to send rxs as requested.

## 2014-11-05 NOTE — Telephone Encounter (Signed)
Pt called requesting status of refill. He would like a call when these meds have been called in

## 2014-11-05 NOTE — Telephone Encounter (Signed)
Rx sent through e-scribe as requested 

## 2014-11-05 NOTE — Telephone Encounter (Signed)
Pt left v/m; pt saw diabetic nutritionist and was advised to ask Webb Silversmith NP  For ultra onetouch test strips and onetouch delica lancets to [facility]. Test strips and lancets are not on med list and pt does not appear to be taking med for diabetes.Please advise. Pt request test strips and lancets be sent to pharmacy today.

## 2014-12-04 ENCOUNTER — Ambulatory Visit
Admit: 2014-12-04 | Disposition: A | Discharge status: Home / Self Care | Payer: Self-pay | Attending: Internal Medicine | Admitting: Internal Medicine

## 2014-12-08 ENCOUNTER — Telehealth: Payer: Self-pay | Admitting: Internal Medicine

## 2014-12-08 NOTE — Telephone Encounter (Signed)
Diane from [employer] called to verify that certificate of medical necessity for diabetic supplies was faxed in.  Please call Diane back and confirm that this was done.  Thank [phone]

## 2014-12-08 NOTE — Telephone Encounter (Signed)
The number is invalid

## 2014-12-09 NOTE — Telephone Encounter (Signed)
Spoke with Diane and the form was faxed to [facility] will refax to [phone]

## 2014-12-11 NOTE — Telephone Encounter (Signed)
Form has been faxed.

## 2015-01-20 ENCOUNTER — Ambulatory Visit: Payer: BLUE CROSS/BLUE SHIELD | Admitting: Internal Medicine

## 2015-01-21 ENCOUNTER — Ambulatory Visit (INDEPENDENT_AMBULATORY_CARE_PROVIDER_SITE_OTHER): Payer: BLUE CROSS/BLUE SHIELD | Admitting: Internal Medicine

## 2015-01-21 ENCOUNTER — Encounter: Payer: Self-pay | Admitting: Internal Medicine

## 2015-01-21 VITALS — BP 152/90 | HR 74 | Temp 98.1°F | Wt 320.5 lb

## 2015-01-21 DIAGNOSIS — Z9989 Dependence on other enabling machines and devices: Secondary | ICD-10-CM

## 2015-01-21 DIAGNOSIS — G4733 Obstructive sleep apnea (adult) (pediatric): Secondary | ICD-10-CM | POA: Diagnosis not present

## 2015-01-21 DIAGNOSIS — E119 Type 2 diabetes mellitus without complications: Secondary | ICD-10-CM

## 2015-01-21 DIAGNOSIS — I10 Essential (primary) hypertension: Secondary | ICD-10-CM | POA: Diagnosis not present

## 2015-01-21 NOTE — Assessment & Plan Note (Signed)
Will repeat A1C and Lipid profile today Microalbumin done 10/2014 Encouraged him to continue to work on diet and exercise He declines flu and pneumovax today Advised him to make an eye appt Foot exam today
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
## 2015-01-21 NOTE — Assessment & Plan Note (Signed)
Congratulated him on his weight loss Encouraged him to continue to work on diet and exercise

## 2015-01-21 NOTE — Progress Notes (Signed)
Pre visit review using our clinic review tool, if applicable. No additional management support is needed unless otherwise documented below in the visit note. 

## 2015-01-21 NOTE — Assessment & Plan Note (Signed)
BP elevated today He is not symptomatic He does not want to add another blood pressure medication at this time He will continue to work on weight loss Will check CBC and CMET today

## 2015-01-21 NOTE — Patient Instructions (Signed)
Diabetes Mellitus and Food It is important for you to manage your blood sugar (glucose) level. Your blood glucose level can be greatly affected by what you eat. Eating healthier foods in the appropriate amounts throughout the day at about the same time each day will help you control your blood glucose level. It can also help slow or prevent worsening of your diabetes mellitus. Healthy eating may even help you improve the level of your blood pressure and reach or maintain a healthy weight.  HOW CAN FOOD AFFECT ME? Carbohydrates Carbohydrates affect your blood glucose level more than any other type of food. Your dietitian will help you determine how many carbohydrates to eat at each meal and teach you how to count carbohydrates. Counting carbohydrates is important to keep your blood glucose at a healthy level, especially if you are using insulin or taking certain medicines for diabetes mellitus. Alcohol Alcohol can cause sudden decreases in blood glucose (hypoglycemia), especially if you use insulin or take certain medicines for diabetes mellitus. Hypoglycemia can be a life-threatening condition. Symptoms of hypoglycemia (sleepiness, dizziness, and disorientation) are similar to symptoms of having too much alcohol.  If your health care provider has given you approval to drink alcohol, do so in moderation and use the following guidelines:  Women should not have more than one drink per day, and men should not have more than two drinks per day. One drink is equal to:  12 oz of beer.  5 oz of wine.  1 oz of hard liquor.  Do not drink on an empty stomach.  Keep yourself hydrated. Have water, diet soda, or unsweetened iced tea.  Regular soda, juice, and other mixers might contain a lot of carbohydrates and should be counted. WHAT FOODS ARE NOT RECOMMENDED? As you make food choices, it is important to remember that all foods are not the same. Some foods have fewer nutrients per serving than other  foods, even though they might have the same number of calories or carbohydrates. It is difficult to get your body what it needs when you eat foods with fewer nutrients. Examples of foods that you should avoid that are high in calories and carbohydrates but low in nutrients include:  Trans fats (most processed foods list trans fats on the Nutrition Facts label).  Regular soda.  Juice.  Candy.  Sweets, such as cake, pie, doughnuts, and cookies.  Fried foods. WHAT FOODS CAN I EAT? Have nutrient-rich foods, which will nourish your body and keep you healthy. The food you should eat also will depend on several factors, including:  The calories you need.  The medicines you take.  Your weight.  Your blood glucose level.  Your blood pressure level.  Your cholesterol level. You also should eat a variety of foods, including:  Protein, such as meat, poultry, fish, tofu, nuts, and seeds (lean animal proteins are best).  Fruits.  Vegetables.  Dairy products, such as milk, cheese, and yogurt (low fat is best).  Breads, grains, pasta, cereal, rice, and beans.  Fats such as olive oil, trans fat-free margarine, canola oil, avocado, and olives. DOES EVERYONE WITH DIABETES MELLITUS HAVE THE SAME MEAL PLAN? Because every person with diabetes mellitus is different, there is not one meal plan that works for everyone. It is very important that you meet with a dietitian who will help you create a meal plan that is just right for you. Document Released: 05/18/2005 Document Revised: 08/26/2013 Document Reviewed: 07/18/2013 Exit[employer] Patient Information 2015 [employer]. This   information is not intended to replace advice given to you by your health care provider. Make sure you discuss any questions you have with your health care provider.  

## 2015-01-21 NOTE — Assessment & Plan Note (Signed)
Will write requested letter so that he can get the CPAP mask

## 2015-01-22 LAB — COMPREHENSIVE METABOLIC PANEL
ALK PHOS: 65 U/L (ref 39–117)
ALT: 25 U/L (ref 0–53)
AST: 21 U/L (ref 0–37)
Albumin: 4.3 g/dL (ref 3.5–5.2)
BILIRUBIN TOTAL: 0.3 mg/dL (ref 0.2–1.2)
BUN: 17 mg/dL (ref 6–23)
CO2: 25 mEq/L (ref 19–32)
CREATININE: 1.26 mg/dL (ref 0.40–1.50)
Calcium: 10 mg/dL (ref 8.4–10.5)
Chloride: 102 mEq/L (ref 96–112)
GFR: 78.37 mL/min (ref >60.00)
Glucose, Bld: 89 mg/dL (ref 70–99)
Potassium: 3.6 mEq/L (ref 3.5–5.1)
Sodium: 137 mEq/L (ref 135–145)
TOTAL PROTEIN: 8.1 g/dL (ref 6.0–8.3)

## 2015-01-22 LAB — CBC
HCT: 44.7 % (ref 39.0–52.0)
HEMOGLOBIN: 14.8 g/dL (ref 13.0–17.0)
MCHC: 33.2 g/dL (ref 30.0–36.0)
MCV: 83.4 fl (ref 78.0–100.0)
Platelets: 185 10*3/uL (ref 150.0–400.0)
RBC: 5.36 Mil/uL (ref 4.22–5.81)
RDW: 14.9 % (ref 11.5–15.5)
WBC: 6.6 10*3/uL (ref 4.0–10.5)

## 2015-01-22 LAB — LIPID PANEL
CHOL/HDL RATIO: 3
Cholesterol: 135 mg/dL (ref 0–200)
HDL: 40.2 mg/dL (ref >39.00)
LDL CALC: 82 mg/dL (ref 0–99)
NONHDL: 94.8
TRIGLYCERIDES: 62 mg/dL (ref 0.0–149.0)
VLDL: 12.4 mg/dL (ref 0.0–40.0)

## 2015-01-22 LAB — HEMOGLOBIN A1C: Hgb A1c MFr Bld: 6.7 % — ABNORMAL HIGH (ref 4.6–6.5)

## 2015-03-05 ENCOUNTER — Ambulatory Visit (INDEPENDENT_AMBULATORY_CARE_PROVIDER_SITE_OTHER): Payer: BLUE CROSS/BLUE SHIELD | Admitting: Family Medicine

## 2015-03-05 ENCOUNTER — Encounter: Payer: Self-pay | Admitting: Family Medicine

## 2015-03-05 VITALS — BP 150/90 | HR 80 | Temp 99.0°F | Wt 324.0 lb

## 2015-03-05 DIAGNOSIS — R05 Cough: Secondary | ICD-10-CM

## 2015-03-05 DIAGNOSIS — R059 Cough, unspecified: Secondary | ICD-10-CM

## 2015-03-05 MED ORDER — GUAIFENESIN-CODEINE 100-10 MG/5ML PO SYRP: 5.0000 mL | ORAL_SOLUTION | Freq: Every evening | ORAL | Status: DC | PRN

## 2015-03-05 MED ORDER — DOXYCYCLINE HYCLATE 100 MG PO TABS: 100.0000 mg | ORAL_TABLET | Freq: Two times a day (BID) | ORAL | Status: DC

## 2015-03-05 NOTE — Patient Instructions (Signed)
Use the cough medicine and start doxy.  Keep the tag clean and covered.   We can take the tag off later on.   Glad to see you.

## 2015-03-05 NOTE — Progress Notes (Signed)
Pre visit review using our clinic review tool, if applicable. No additional management support is needed unless otherwise documented below in the visit note.  Sugar was 119 this AM.   Cough.  Not sleeping well.  Started about 1 week ago.  Initially dry cough. No pain.  Now with some mucous.  Chest isn't sore.  Sputum is coming from his chest, not from post nasal gtt.  Voice changes noted.  Talking causes the cough to happen.  No upper sx, no nasal or sinus sx.  No ear pain.  Failed OTC cough meds in the meantime. No fevers.   Meds, vitals, and allergies reviewed.   ROS: See HPI.  Otherwise, noncontributory.  GEN: nad, alert and oriented HEENT: mucous membranes moist, tm w/o erythema, nasal exam stuffy (L>R) w/o erythema, clear discharge noted,  OP with mild cobblestoning NECK: supple w/o LA CV: rrr.   PULM: + rhonchi B but not wheeze, no inc wob EXT: no edema SKIN: no acute rash but irritated skin tag on R chest wall.

## 2015-03-08 DIAGNOSIS — R05 Cough: Secondary | ICD-10-CM | POA: Insufficient documentation

## 2015-03-08 DIAGNOSIS — J40 Bronchitis, not specified as acute or chronic: Secondary | ICD-10-CM | POA: Insufficient documentation

## 2015-03-08 DIAGNOSIS — R059 Cough, unspecified: Secondary | ICD-10-CM | POA: Insufficient documentation

## 2015-03-08 NOTE — Assessment & Plan Note (Signed)
Likely bronchitis, would start doxy and use robitussin AC prn with routine cautions on both.  Nontoxic, okay for outpatient f/u.  We can take the skin tag off when he is feeling better.  He agrees.

## 2015-11-22 ENCOUNTER — Other Ambulatory Visit: Payer: Self-pay

## 2015-11-22 MED ORDER — LOSARTAN POTASSIUM-HCTZ 100-25 MG PO TABS: 1.0000 | ORAL_TABLET | Freq: Every day | ORAL | Status: DC

## 2015-11-22 NOTE — Telephone Encounter (Signed)
Mrs Randel request refill for losartan HCTZ to [facility] [address]. Pt last seen 01/2015 and to return in 6 months. Mrs Manner will have pt call due to his work schedule. # 30 to [facility] [address].

## 2015-11-23 ENCOUNTER — Encounter: Payer: Self-pay | Admitting: Internal Medicine

## 2015-11-23 ENCOUNTER — Ambulatory Visit (INDEPENDENT_AMBULATORY_CARE_PROVIDER_SITE_OTHER): Payer: BLUE CROSS/BLUE SHIELD | Admitting: Internal Medicine

## 2015-11-23 VITALS — BP 146/90 | HR 72 | Temp 97.9°F | Wt 335.0 lb

## 2015-11-23 DIAGNOSIS — Z9989 Dependence on other enabling machines and devices: Secondary | ICD-10-CM

## 2015-11-23 DIAGNOSIS — E119 Type 2 diabetes mellitus without complications: Secondary | ICD-10-CM | POA: Diagnosis not present

## 2015-11-23 DIAGNOSIS — I10 Essential (primary) hypertension: Secondary | ICD-10-CM

## 2015-11-23 DIAGNOSIS — G4733 Obstructive sleep apnea (adult) (pediatric): Secondary | ICD-10-CM

## 2015-11-23 LAB — MICROALBUMIN / CREATININE URINE RATIO
CREATININE, U: 98.7 mg/dL
MICROALB/CREAT RATIO: 69.8 mg/g — AB (ref 0.0–30.0)
Microalb, Ur: 68.9 mg/dL — ABNORMAL HIGH (ref 0.0–1.9)

## 2015-11-23 LAB — LIPID PANEL
CHOLESTEROL: 139 mg/dL (ref 0–200)
HDL: 41.9 mg/dL (ref >39.00)
LDL Cholesterol: 80 mg/dL (ref 0–99)
NONHDL: 96.62
Total CHOL/HDL Ratio: 3
Triglycerides: 82 mg/dL (ref 0.0–149.0)
VLDL: 16.4 mg/dL (ref 0.0–40.0)

## 2015-11-23 LAB — COMPREHENSIVE METABOLIC PANEL
ALBUMIN: 4.2 g/dL (ref 3.5–5.2)
ALK PHOS: 61 U/L (ref 39–117)
ALT: 31 U/L (ref 0–53)
AST: 23 U/L (ref 0–37)
BILIRUBIN TOTAL: 0.4 mg/dL (ref 0.2–1.2)
BUN: 21 mg/dL (ref 6–23)
CO2: 31 mEq/L (ref 19–32)
Calcium: 10 mg/dL (ref 8.4–10.5)
Chloride: 103 mEq/L (ref 96–112)
Creatinine, Ser: 1.37 mg/dL (ref 0.40–1.50)
GFR: 70.91 mL/min (ref >60.00)
Glucose, Bld: 129 mg/dL — ABNORMAL HIGH (ref 70–99)
Potassium: 4.3 mEq/L (ref 3.5–5.1)
Sodium: 139 mEq/L (ref 135–145)
Total Protein: 7.7 g/dL (ref 6.0–8.3)

## 2015-11-23 LAB — CBC
HEMATOCRIT: 43.5 % (ref 39.0–52.0)
Hemoglobin: 14.5 g/dL (ref 13.0–17.0)
MCHC: 33.3 g/dL (ref 30.0–36.0)
MCV: 83.5 fl (ref 78.0–100.0)
PLATELETS: 167 10*3/uL (ref 150.0–400.0)
RBC: 5.21 Mil/uL (ref 4.22–5.81)
RDW: 16.1 % — ABNORMAL HIGH (ref 11.5–15.5)
WBC: 6.1 10*3/uL (ref 4.0–10.5)

## 2015-11-23 LAB — HEMOGLOBIN A1C: Hgb A1c MFr Bld: 8 % — ABNORMAL HIGH (ref 4.6–6.5)

## 2015-11-23 MED ORDER — LOSARTAN POTASSIUM-HCTZ 100-25 MG PO TABS: 1.0000 | ORAL_TABLET | Freq: Every day | ORAL | Status: DC

## 2015-11-23 NOTE — Assessment & Plan Note (Signed)
Will check A1C and microalbumin today Encouraged him to get a yearly eye exam He declines flu and pneumovax today Foot exam today Encouraged him to consume a low fat, low carb diet Exercise to lose weight

## 2015-11-23 NOTE — Progress Notes (Signed)
Pre visit review using our clinic review tool, if applicable. No additional management support is needed unless otherwise documented below in the visit note. 

## 2015-11-23 NOTE — Patient Instructions (Signed)

## 2015-11-23 NOTE — Assessment & Plan Note (Addendum)
Fair control on Hyzaar He declines ECG today Discussed DASH diet and exercising to lose weight

## 2015-11-23 NOTE — Assessment & Plan Note (Signed)
Advised him to consume a low fat, low carb diet and exercise to lose weight

## 2015-11-23 NOTE — Assessment & Plan Note (Addendum)
Discussed how weight loss could help improve his symptoms Referral to pulmonology for a new sleep study

## 2015-11-23 NOTE — Progress Notes (Signed)
Subjective:    Patient ID: Devon Rios, male    DOB: November 28, 1965, 50 y.o.   MRN: [id number]  HPI  Pt presents to the clinic today for followup of chronic medical conditions.  DM2: He does not believe that he has this. His last A1C was 6.7% 01/2015. He is refusing to take medications. His fasting sugars range from 100-150. He is keeping records of what he eats and trying to avoid those things in his diet that raise his blood sugar. Eye exam was 1 year ago, 12/2013. Flu never. Pneumovax never.  HTN: BP is 146/90. He is taking Hyzaar. He denies chest pain, chest tightness or shortness of breath. He does not monitor his blood pressure at home. There is no ECG on file.  OSA on CPAP: He is averaging about 5-6 hours of good sleep per night. He does need a referral for a repeat sleep study.  Obesity: He has gained 15 lbs since his last visit. His BMI today is 54.88. He has been lax on his diet and not walking as much because of the cold weather.  Review of Systems      Past Medical History  Diagnosis Date  . Chicken pox   . Frequent headaches   . Hypertension   . OSA on CPAP 2013  . Severe obesity (BMI >= 40) ([place]) 07/14/2014  . DM2 (diabetes mellitus, type 2) ([place]) 07/21/2014    Current Outpatient Prescriptions  Medication Sig Dispense Refill  . glucose blood (ONE TOUCH TEST STRIPS) test strip 1 each by Other route 2 (two) times daily. Ultra Dx E11.9 100 each 6  . losartan-hydrochlorothiazide (HYZAAR) 100-25 MG tablet Take 1 tablet by mouth daily. 30 tablet 0  . Multiple Vitamin (MULTIVITAMIN) tablet Take 1 tablet by mouth daily.    . Omega-3 Fatty Acids (FISH OIL) 1000 MG CAPS Take 1 capsule by mouth daily.    [name] DELICA LANCETS 99991111 MISC 1 each by Does not apply route 2 (two) times daily. Dx E11.9 100 each 6   No current facility-administered medications for this visit.    No Known Allergies  Family History  Problem Relation Age of Onset  . Hypertension Mother   .  Diabetes Mother   . Cancer Maternal Grandmother     Lung  . Stroke Neg Hx     Social History   Social History  . Marital Status: Married    Spouse Name: N/A  . Number of Children: N/A  . Years of Education: N/A   Occupational History  . Not on file.   Social History Main Topics  . Smoking status: Never Smoker   . Smokeless tobacco: Never Used  . Alcohol Use: 0.0 oz/week    0 Standard drinks or equivalent per week     Comment: never  . Drug Use: No  . Sexual Activity: Yes   Other Topics Concern  . Not on file   Social History Narrative     Constitutional: Pt reports intermittent headache (stress) Denies fever, malaise, fatigue, or abrupt weight changes.  HEENT: Denies eye pain, eye redness, ear pain, ringing in the ears, wax buildup, runny nose, nasal congestion, bloody nose, or sore throat. Respiratory: Denies difficulty breathing, shortness of breath, cough or sputum production.   Cardiovascular: Denies chest pain, chest tightness, palpitations or swelling in the hands or feet.  Skin: Denies redness, rashes, lesions or ulcercations.  Neurological: Denies dizziness, difficulty with memory, difficulty with speech or problems with balance and coordination.  No other specific complaints in a complete review of systems (except as listed in HPI above).  Objective:   Physical Exam    BP 146/90 mmHg  Pulse 72  Temp(Src) 97.9 F (36.6 C) (Oral)  Wt 335 lb (151.955 kg)  SpO2 97%  Wt Readings from Last 3 Encounters:  11/23/15 335 lb (151.955 kg)  03/05/15 324 lb (146.965 kg)  01/21/15 320 lb 8 oz (145.378 kg)    General: Appears his stated age, obese in NAD. Skin: Warm, dry and intact. No rashes, lesions or ulcerations noted. Cardiovascular: Normal rate and rhythm. S1,S2 noted.  No murmur, rubs or gallops noted. No JVD or BLE edema. No carotid bruits noted. Pulmonary/Chest: Normal effort and positive vesicular breath sounds. No respiratory distress. No wheezes,  rales or ronchi noted.  Neurological: Alert and oriented. Sensation intact to BLE.  BMET    Component Value Date/Time   NA 137 01/21/2015 1631   K 3.6 01/21/2015 1631   CL 102 01/21/2015 1631   CO2 25 01/21/2015 1631   GLUCOSE 89 01/21/2015 1631   BUN 17 01/21/2015 1631   CREATININE 1.26 01/21/2015 1631   CALCIUM 10.0 01/21/2015 1631    Lipid Panel     Component Value Date/Time   CHOL 135 01/21/2015 1631   TRIG 62.0 01/21/2015 1631   HDL 40.20 01/21/2015 1631   CHOLHDL 3 01/21/2015 1631   VLDL 12.4 01/21/2015 1631   LDLCALC 82 01/21/2015 1631    CBC    Component Value Date/Time   WBC 6.6 01/21/2015 1631   RBC 5.36 01/21/2015 1631   HGB 14.8 01/21/2015 1631   HCT 44.7 01/21/2015 1631   PLT 185.0 01/21/2015 1631   MCV 83.4 01/21/2015 1631   MCHC 33.2 01/21/2015 1631   RDW 14.9 01/21/2015 1631    Hgb A1C Lab Results  Component Value Date   HGBA1C 6.7* 01/21/2015       Assessment & Plan:

## 2016-01-03 ENCOUNTER — Ambulatory Visit (INDEPENDENT_AMBULATORY_CARE_PROVIDER_SITE_OTHER): Payer: BLUE CROSS/BLUE SHIELD | Admitting: Internal Medicine

## 2016-01-03 ENCOUNTER — Encounter: Payer: Self-pay | Admitting: Internal Medicine

## 2016-01-03 ENCOUNTER — Encounter (INDEPENDENT_AMBULATORY_CARE_PROVIDER_SITE_OTHER): Payer: Self-pay

## 2016-01-03 VITALS — BP 144/76 | HR 88 | Ht 66.0 in | Wt 337.0 lb

## 2016-01-03 DIAGNOSIS — G4733 Obstructive sleep apnea (adult) (pediatric): Secondary | ICD-10-CM | POA: Diagnosis not present

## 2016-01-03 MED ORDER — ESZOPICLONE 2 MG PO TABS: 2.0000 mg | ORAL_TABLET | Freq: Every evening | ORAL | Status: DC | PRN

## 2016-01-03 NOTE — Progress Notes (Signed)
[facility] Consultation      Assessment and Plan:  Obstructive sleep apnea. -The patient's sleep apnea appears to be adequately treated, he is using his VPAP auto machine daily, approximately 8 hours per day, setting is 22/16, spontaneous. Residual apnea is minimal. -We will give the prescription for renewal of Pap supplies, the patient was instructed that should he have difficulty getting supplies covered by insurance, he could always purchase them online at [email or web address] or [employer].com out-of-pocket.  Insomnia. -Appears predominantly maintenance type of insomnia, will start the patient on Lunesta 2 mg daily at bedtime. If this is not covered. We can try Ambien CR 12.5 mg daily at bedtime.  Date: 01/03/2016  MRN# [id number] Devon Rios [birth year]  Referring Physician: Dr. Garnette Gunner.   Hilary Murdick is a 50 y.o. old male seen in consultation for chief complaint of:    Chief Complaint  Patient presents with  . sleep consult    pt ref dr. Garnette Gunner. pt currently wears CPAP 7-8 nightly. pressure is good. supplies needed (mask & head gear)    HPI:   The patient is a 50 year old male with a known history of obstructive sleep apnea. He typically goes to bed between 8:30 and 10 PM, takes about 30 minutes to fall asleep. He wakes up about 4 times per night, typically gets out of bed at 4:30 AM. Denies much in the way of daytime sleepiness, the patient wakes and feels well rested.  He relocated here about 1.5 years ago. He notes that he is only getting about 4 hours per night and can not get to sleep any earlier than present. His OSA was diagnosed in 2011 in [place]. He had what sounds like a split night study, he was set up with the cpap at that time and has been using the same machine since that time.  He has not gotten new supplies for the last 1.5 years.   He has had to staple his straps together, his machine is a VPAP auto.   He has been taking a medicine for insomnia which he  notes affected his mode so he stopped. He is currently taking melatonin, which he finds helps, he has to take several tablets of this to get to sleep.  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   Review of download data from the patient's VPAP auto on 01/03/2016: Machine-S9 VPAP-Auto; spontaneous mode, 22/16. Residual AHI is 0.3, average daily usage is 8 hours and 14 minutes on 100% of days.   PMHX:   Past Medical History  Diagnosis Date  . Chicken pox   . Frequent headaches   . Hypertension   . OSA on CPAP 2013  . Severe obesity (BMI >= 40) ([place]) 07/14/2014  . DM2 (diabetes mellitus, type 2) ([place]) 07/21/2014   Surgical Hx:  Past Surgical History  Procedure Laterality Date  . Meniscus repair Right     Right knee  . Vasectomy     Family Hx:  Family History  Problem Relation Age of Onset  . Hypertension Mother   . Diabetes Mother   . Cancer Maternal Grandmother     Lung  . Stroke Neg Hx    Social Hx:   Social History  Substance Use Topics  . Smoking status: Never Smoker   . Smokeless tobacco: Never Used  . Alcohol Use: 0.0 oz/week    0 Standard drinks or equivalent per week  Comment: never   Medication:   Current Outpatient Rx  Name  Route  Sig  Dispense  Refill  . glucose blood (ONE TOUCH TEST STRIPS) test strip   Other   1 each by Other route 2 (two) times daily. Ultra Dx E11.9   100 each   6   . losartan-hydrochlorothiazide (HYZAAR) 100-25 MG tablet   Oral   Take 1 tablet by mouth daily.   30 tablet   5   . Multiple Vitamin (MULTIVITAMIN) tablet   Oral   Take 1 tablet by mouth daily.         . Omega-3 Fatty Acids (FISH OIL) 1000 MG CAPS   Oral   Take 1 capsule by mouth daily.         [name] DELICA LANCETS [id number] MISC   Does not apply   1 each by Does not apply route 2 (two) times daily. Dx E11.9   100 each   6       Allergies:  Review of patient's allergies indicates no  known allergies.  Review of Systems: Gen:  Denies  fever, sweats, chills HEENT: Denies blurred vision, double vision. bleeds, sore throat Cvc:  No dizziness, chest pain. Resp:   Denies cough or sputum production, shortness of breath Gi: Denies swallowing difficulty, stomach pain. Gu:  Denies bladder incontinence, burning urine Ext:   No Joint pain, stiffness. Skin: No skin rash,  hives  Endoc:  No polyuria, polydipsia. Psych: No depression, insomnia. Other:  All other systems were reviewed with the patient and were negative other that what is mentioned in the HPI.   Physical Examination:   VS: BP 144/76 mmHg  Pulse 88  Ht 5\' 6"  (1.676 m)  Wt 337 lb (152.862 kg)  BMI 54.42 kg/m2  SpO2 95%  General Appearance: No distress  Neuro:without focal findings,  speech normal,  HEENT: PERRLA, EOM intact.   Pulmonary: normal breath sounds, No wheezing.  CardiovascularNormal S1,S2.  No m/r/g.   Abdomen: Benign, Soft, non-tender. Renal:  No costovertebral tenderness  GU:  No performed at this time. Endoc: No evident thyromegaly, no signs of acromegaly. Skin:   warm, no rashes, no ecchymosis  Extremities: normal, no cyanosis, clubbing.  Other findings:    LABORATORY PANEL:   CBC No results for input(s): WBC, HGB, HCT, PLT in the last 168 hours. ------------------------------------------------------------------------------------------------------------------  Chemistries  No results for input(s): NA, K, CL, CO2, GLUCOSE, BUN, CREATININE, CALCIUM, MG, AST, ALT, ALKPHOS, BILITOT in the last 168 hours.  Invalid input(s): GFRCGP ------------------------------------------------------------------------------------------------------------------  Cardiac Enzymes No results for input(s): TROPONINI in the last 168 hours. ------------------------------------------------------------  RADIOLOGY:  No results found.     Thank  you for the consultation and for allowing [facility] to assist in the care of your patient. Our recommendations are noted above.  Please contact us if we can be of further service.   Marda Stalker, MD.  Board Certified in Internal Medicine, Pulmonary Medicine, [place], and Sleep Medicine.  [facility] Office Number: [phone]  Patricia Pesa, M.D.  Vilinda Boehringer, M.D.  Merton Border, M.D  01/03/2016

## 2016-01-03 NOTE — Patient Instructions (Addendum)
Continue using CPAP every night.   Will start lunesta; 2 mg at bedtime. (let us know if not covered and we will change this prescription).   Will renew sleep supplies, mask, tubing, filters, reservoir for one year.   You can also purchase supplies (out of pocket cost) at [email or web address] or [email or web address]

## 2016-06-01 ENCOUNTER — Telehealth: Payer: Self-pay | Admitting: Internal Medicine

## 2016-06-01 ENCOUNTER — Encounter: Payer: BLUE CROSS/BLUE SHIELD | Admitting: Internal Medicine

## 2016-06-01 DIAGNOSIS — Z0289 Encounter for other administrative examinations: Secondary | ICD-10-CM

## 2016-06-01 NOTE — Telephone Encounter (Signed)
Yes, he needs to reschedule

## 2016-06-01 NOTE — Telephone Encounter (Signed)
Patient did not come in for their appointment today for cpe.  Please let me know if patient needs to be contacted immediately for follow up or no follow up needed. °

## 2016-06-15 ENCOUNTER — Telehealth: Payer: Self-pay | Admitting: Internal Medicine

## 2016-06-15 NOTE — Telephone Encounter (Signed)
Pt wife requested a cb to discuss no show fee Jeneen Rinks received from 9/28 cpe with [facility]. There was an accident at his job and she has pics to prove it.

## 2016-06-16 NOTE — Telephone Encounter (Signed)
Attempted to call spouse cell and pt cell to address no show fee.  Unable to leave message on either phone

## 2016-06-20 ENCOUNTER — Other Ambulatory Visit: Payer: Self-pay | Admitting: Internal Medicine

## 2016-06-20 DIAGNOSIS — I10 Essential (primary) hypertension: Secondary | ICD-10-CM

## 2016-06-26 ENCOUNTER — Encounter: Payer: Self-pay | Admitting: Internal Medicine

## 2016-06-26 NOTE — Telephone Encounter (Signed)
Sent letter to patient to reschedule

## 2016-07-03 ENCOUNTER — Ambulatory Visit (INDEPENDENT_AMBULATORY_CARE_PROVIDER_SITE_OTHER): Payer: BLUE CROSS/BLUE SHIELD | Admitting: Internal Medicine

## 2016-07-03 ENCOUNTER — Encounter: Payer: Self-pay | Admitting: Internal Medicine

## 2016-07-03 VITALS — BP 144/88 | HR 73 | Temp 98.2°F | Wt 344.0 lb

## 2016-07-03 DIAGNOSIS — G4733 Obstructive sleep apnea (adult) (pediatric): Secondary | ICD-10-CM | POA: Diagnosis not present

## 2016-07-03 DIAGNOSIS — E1165 Type 2 diabetes mellitus with hyperglycemia: Secondary | ICD-10-CM

## 2016-07-03 DIAGNOSIS — I10 Essential (primary) hypertension: Secondary | ICD-10-CM

## 2016-07-03 DIAGNOSIS — Z9989 Dependence on other enabling machines and devices: Secondary | ICD-10-CM

## 2016-07-03 MED ORDER — LOSARTAN POTASSIUM-HCTZ 100-25 MG PO TABS: 1.0000 | ORAL_TABLET | Freq: Every day | ORAL | 1 refills | Status: DC

## 2016-07-03 NOTE — Patient Instructions (Signed)

## 2016-07-03 NOTE — Assessment & Plan Note (Signed)
Still slight high He was out of his meds Discussed importance of medications and keeping appts Hyzaar refilled today CMET today

## 2016-07-03 NOTE — Progress Notes (Signed)
Subjective:    Patient ID: Devon Rios, male    DOB: 1966-08-23, 50 y.o.   MRN: [id number]  HPI  Pt presents to the clinic today for followup of chronic medical conditions.  DM2:  His last A1C was 8% 11/2015. He was advised to start Metformin but is refusing to take medications for this. His fasting sugars range from 100-150. He is keeping records of what he eats and trying to avoid those things in his diet that raise his blood sugar. His last eye exam was > 1 year ago, 12/2013. Flu never. Pneumovax never.  HTN: BP today is 144/80. He is taking Hyzaar but reports he was out his medication for 1 week prior to this appt. He denies chest pain or chest tightness. His is SOB at times but thinks this is due to lack of exercise. He does not monitor his blood pressure at home. There is no ECG on file.  OSA on CPAP: He is averaging about 5-6 hours of good sleep per night. He had a sleep study 04/2011. He had been trying to get his supplies from a pulmonologist in [place], but he reports that became too complicated. He is taking Melatonin with some good results.  Obesity: He has gained 9 lbs since his last visit. His BMI today is 55.52. He is not working on diet or exercise.  Review of Systems      Past Medical History:  Diagnosis Date  . Chicken pox   . DM2 (diabetes mellitus, type 2) ([place]) 07/21/2014  . Frequent headaches   . Hypertension   . OSA on CPAP 2013  . Severe obesity (BMI >= 40) ([facility]) 07/14/2014    Current Outpatient Prescriptions  Medication Sig Dispense Refill  . eszopiclone (LUNESTA) 2 MG TABS tablet Take 1 tablet (2 mg total) by mouth at bedtime as needed for sleep. Take immediately before bedtime 90 tablet 3  . glucose blood (ONE TOUCH TEST STRIPS) test strip 1 each by Other route 2 (two) times daily. Ultra Dx E11.9 100 each 6  . losartan-hydrochlorothiazide (HYZAAR) 100-25 MG tablet Take 1 tablet by mouth daily. MUST SCHEDULE ANNUAL EXAM FOR FURTHER REFILLS 30 tablet 1    . Multiple Vitamin (MULTIVITAMIN) tablet Take 1 tablet by mouth daily.    . Omega-3 Fatty Acids (FISH OIL) 1000 MG CAPS Take 1 capsule by mouth daily.    [name] DELICA LANCETS 38H MISC 1 each by Does not apply route 2 (two) times daily. Dx E11.9 100 each 6   No current facility-administered medications for this visit.     No Known Allergies  Family History  Problem Relation Age of Onset  . Hypertension Mother   . Diabetes Mother   . Cancer Maternal Grandmother     Lung  . Stroke Neg Hx     Social History   Social History  . Marital status: Married    Spouse name: N/A  . Number of children: N/A  . Years of education: N/A   Occupational History  . Not on file.   Social History Main Topics  . Smoking status: Never Smoker  . Smokeless tobacco: Never Used  . Alcohol use 0.0 oz/week     Comment: never  . Drug use: No  . Sexual activity: Yes   Other Topics Concern  . Not on file   Social History Narrative  . No narrative on file     Constitutional: Denies fever, malaise, fatigue, or abrupt weight changes.  HEENT:  Denies eye pain, eye redness, ear pain, ringing in the ears, wax buildup, runny nose, nasal congestion, bloody nose, or sore throat. Respiratory: Pt reports shortness of breath. Denies difficulty breathing, cough or sputum production.   Cardiovascular: Denies chest pain, chest tightness, palpitations or swelling in the hands or feet.  Skin: Denies redness, rashes, lesions or ulcercations.  Neurological: Denies dizziness, difficulty with memory, difficulty with speech or problems with balance and coordination.   No other specific complaints in a complete review of systems (except as listed in HPI above).  Objective:   Physical Exam  BP (!) 144/88   Pulse 73   Temp 98.2 F (36.8 C) (Oral)   Wt (!) 344 lb (156 kg)   SpO2 97%   BMI 55.52 kg/m    Wt Readings from Last 3 Encounters:  01/03/16 (!) 337 lb (152.9 kg)  11/23/15 (!) 335 lb (152 kg)   03/05/15 (!) 324 lb (147 kg)    General: Appears his stated age, obese in NAD. Skin: Warm, dry and intact. No wounds noted. Cardiovascular: Normal rate and rhythm. S1,S2 noted.  No murmur, rubs or gallops noted. No JVD or BLE edema. No carotid bruits noted. Pulmonary/Chest: Normal effort and positive vesicular breath sounds. No respiratory distress. No wheezes, rales or ronchi noted.  Neurological: Alert and oriented. Sensation intact to BLE.  BMET    Component Value Date/Time   NA 139 11/23/2015 1037   K 4.3 11/23/2015 1037   CL 103 11/23/2015 1037   CO2 31 11/23/2015 1037   GLUCOSE 129 (H) 11/23/2015 1037   BUN 21 11/23/2015 1037   CREATININE 1.37 11/23/2015 1037   CALCIUM 10.0 11/23/2015 1037    Lipid Panel     Component Value Date/Time   CHOL 139 11/23/2015 1037   TRIG 82.0 11/23/2015 1037   HDL 41.90 11/23/2015 1037   CHOLHDL 3 11/23/2015 1037   VLDL 16.4 11/23/2015 1037   LDLCALC 80 11/23/2015 1037    CBC    Component Value Date/Time   WBC 6.1 11/23/2015 1037   RBC 5.21 11/23/2015 1037   HGB 14.5 11/23/2015 1037   HCT 43.5 11/23/2015 1037   PLT 167.0 11/23/2015 1037   MCV 83.5 11/23/2015 1037   MCHC 33.3 11/23/2015 1037   RDW 16.1 (H) 11/23/2015 1037    Hgb A1C Lab Results  Component Value Date   HGBA1C 8.0 (H) 11/23/2015       Assessment & Plan:

## 2016-07-03 NOTE — Assessment & Plan Note (Signed)
Uncontrolled He still refuses medications Discussed starting Metformin or Glipizide, he still declines at this time Encouraged yearly eye exams He declines flu and pneumovax today Foot exam today A1C today

## 2016-07-03 NOTE — Assessment & Plan Note (Signed)
Encouraged weight loss He is not interested in referral to a different pulmonologist He wants to buy his CPAP supplies online

## 2016-07-03 NOTE — Assessment & Plan Note (Signed)
Encouraged her to work on diet and exercise 

## 2016-07-04 ENCOUNTER — Other Ambulatory Visit: Payer: Self-pay | Admitting: *Deleted

## 2016-07-04 LAB — COMPREHENSIVE METABOLIC PANEL
ALT: 29 U/L (ref 0–53)
AST: 22 U/L (ref 0–37)
Albumin: 4 g/dL (ref 3.5–5.2)
Alkaline Phosphatase: 82 U/L (ref 39–117)
BUN: 19 mg/dL (ref 6–23)
CALCIUM: 10.2 mg/dL (ref 8.4–10.5)
CHLORIDE: 101 meq/L (ref 96–112)
CO2: 30 meq/L (ref 19–32)
CREATININE: 1.38 mg/dL (ref 0.40–1.50)
GFR: 70.14 mL/min (ref >60.00)
Glucose, Bld: 261 mg/dL — ABNORMAL HIGH (ref 70–99)
POTASSIUM: 4.3 meq/L (ref 3.5–5.1)
Sodium: 140 mEq/L (ref 135–145)
Total Bilirubin: 0.3 mg/dL (ref 0.2–1.2)
Total Protein: 7.7 g/dL (ref 6.0–8.3)

## 2016-07-04 LAB — CBC
HEMATOCRIT: 45.7 % (ref 39.0–52.0)
Hemoglobin: 15 g/dL (ref 13.0–17.0)
MCHC: 32.8 g/dL (ref 30.0–36.0)
MCV: 85.3 fl (ref 78.0–100.0)
PLATELETS: 179 10*3/uL (ref 150.0–400.0)
RBC: 5.36 Mil/uL (ref 4.22–5.81)
RDW: 15.2 % (ref 11.5–15.5)
WBC: 6.8 10*3/uL (ref 4.0–10.5)

## 2016-07-04 LAB — LIPID PANEL
CHOL/HDL RATIO: 4
CHOLESTEROL: 142 mg/dL (ref 0–200)
HDL: 40 mg/dL (ref >39.00)
LDL CALC: 76 mg/dL (ref 0–99)
NonHDL: 102.2
TRIGLYCERIDES: 133 mg/dL (ref 0.0–149.0)
VLDL: 26.6 mg/dL (ref 0.0–40.0)

## 2016-07-04 LAB — HEMOGLOBIN A1C: Hgb A1c MFr Bld: 8.7 % — ABNORMAL HIGH (ref 4.6–6.5)

## 2016-07-04 MED ORDER — GLIPIZIDE 10 MG PO TABS
10.0000 mg | ORAL_TABLET | Freq: Two times a day (BID) | ORAL | 3 refills | Status: DC
Start: 2016-07-04 — End: 2016-11-06

## 2016-08-22 ENCOUNTER — Ambulatory Visit (INDEPENDENT_AMBULATORY_CARE_PROVIDER_SITE_OTHER): Payer: BLUE CROSS/BLUE SHIELD | Admitting: Internal Medicine

## 2016-08-22 ENCOUNTER — Encounter: Payer: Self-pay | Admitting: Internal Medicine

## 2016-08-22 VITALS — BP 142/82 | HR 79 | Temp 97.8°F | Ht 65.5 in | Wt 344.0 lb

## 2016-08-22 DIAGNOSIS — Z0001 Encounter for general adult medical examination with abnormal findings: Secondary | ICD-10-CM

## 2016-08-22 DIAGNOSIS — I10 Essential (primary) hypertension: Secondary | ICD-10-CM

## 2016-08-22 NOTE — Assessment & Plan Note (Signed)
Uncontrolled on Losartan HCTZ Discussed adding Amlodipine, he refused, stating "I don't want to take any more pills"

## 2016-08-22 NOTE — Progress Notes (Signed)
Subjective:    Patient ID: Devon Rios, male    DOB: 08-Jul-1966, 50 y.o.   MRN: [id number]  HPI  Pt presents to the clinic today for his annual exam.  Flu: never Tetanus: ? 2009 Pneumovax: never PSA Screening: never Colon Screening: 2010 Vision Screening: as needed Dentist: as needed  Diet: He does eat meat. He consumes fruits and veggies most days. He does eat some fried foods. He drinks mostly beet juice, water, green tea. Exercise: None  HTN: His BP today is 142/82. He is already taking Losartan HCTZ. He denies chest pain or shortness of breath. There is no ECG on file.  Review of Systems      Past Medical History:  Diagnosis Date  . Chicken pox   . DM2 (diabetes mellitus, type 2) ([place]) 07/21/2014  . Frequent headaches   . Hypertension   . OSA on CPAP 2013  . Severe obesity (BMI >= 40) ([facility]) 07/14/2014    Current Outpatient Prescriptions  Medication Sig Dispense Refill  . glipiZIDE (GLUCOTROL) 10 MG tablet Take 1 tablet (10 mg total) by mouth 2 (two) times daily before a meal. 60 tablet 3  . glucose blood (ONE TOUCH TEST STRIPS) test strip 1 each by Other route 2 (two) times daily. Ultra Dx E11.9 100 each 6  . losartan-hydrochlorothiazide (HYZAAR) 100-25 MG tablet Take 1 tablet by mouth daily. 90 tablet 1  . Multiple Vitamin (MULTIVITAMIN) tablet Take 1 tablet by mouth daily.    . Omega-3 Fatty Acids (FISH OIL) 1000 MG CAPS Take 1 capsule by mouth daily.    [name] DELICA LANCETS 44I MISC 1 each by Does not apply route 2 (two) times daily. Dx E11.9 100 each 6   No current facility-administered medications for this visit.     No Known Allergies  Family History  Problem Relation Age of Onset  . Hypertension Mother   . Diabetes Mother   . Cancer Maternal Grandmother     Lung  . Stroke Neg Hx     Social History   Social History  . Marital status: Married    Spouse name: N/A  . Number of children: N/A  . Years of education: N/A   Occupational  History  . Not on file.   Social History Main Topics  . Smoking status: Never Smoker  . Smokeless tobacco: Never Used  . Alcohol use 0.0 oz/week     Comment: never  . Drug use: No  . Sexual activity: Yes   Other Topics Concern  . Not on file   Social History Narrative  . No narrative on file     Constitutional: Denies fever, malaise, fatigue, headache or abrupt weight changes.  HEENT: Denies eye pain, eye redness, ear pain, ringing in the ears, wax buildup, runny nose, nasal congestion, bloody nose, or sore throat. Respiratory: Pt reports intermittent shortness of breath. Denies difficulty breathing, cough or sputum production.   Cardiovascular: Denies chest pain, chest tightness, palpitations or swelling in the hands or feet.  Gastrointestinal: Pt reports intermittent constipation. Denies abdominal pain, bloating, diarrhea or blood in the stool.  GU: Denies urgency, frequency, pain with urination, burning sensation, blood in urine, odor or discharge. Musculoskeletal: Denies decrease in range of motion, difficulty with gait, muscle pain or joint pain and swelling.  Skin: Denies redness, rashes, lesions or ulcercations.  Neurological: Denies dizziness, difficulty with memory, difficulty with speech or problems with balance and coordination.  Psych: Denies anxiety, depression, SI/HI.  No other  specific complaints in a complete review of systems (except as listed in HPI above).  Objective:   Physical Exam   BP (!) 142/82   Pulse 79   Temp 97.8 F (36.6 C) (Oral)   Ht 5' 5.5" (1.664 m)   Wt (!) 344 lb (156 kg)   SpO2 98%   BMI 56.37 kg/m  Wt Readings from Last 3 Encounters:  08/22/16 (!) 344 lb (156 kg)  07/03/16 (!) 344 lb (156 kg)  01/03/16 (!) 337 lb (152.9 kg)    General: Appears his stated age, obese in NAD. Skin: Warm, dry and intact. No ulcerations noted. Acanthous nigricans noted on neck. HEENT: Head: normal shape and size; Eyes: sclera white, no icterus,  conjunctiva pink, PERRLA and EOMs intact; Ears: Tm's gray and intact, normal light reflex;Throat/Mouth: Teeth present, mucosa pink and moist, no exudate, lesions or ulcerations noted.  Neck:  Neck supple, trachea midline. No masses, lumps or thyromegaly present.  Cardiovascular: Normal rate and rhythm. S1,S2 noted.  No murmur, rubs or gallops noted. No JVD or BLE edema. No carotid bruits noted. Pulmonary/Chest: Normal effort and positive vesicular breath sounds. No respiratory distress. No wheezes, rales or ronchi noted.  Abdomen: Soft and nontender. Normal bowel sounds. No distention or masses noted. Liver, spleen and kidneys non palpable due to size. Musculoskeletal: Normal range of motion. Strength 5/5 BUE/BLE. No difficulty with gait.  Neurological: Alert and oriented. Cranial nerves II-XII grossly intact. Coordination normal.  Psychiatric: Mood and affect normal. Behavior is normal. Judgment and thought content normal.     BMET    Component Value Date/Time   NA 140 07/03/2016 1526   K 4.3 07/03/2016 1526   CL 101 07/03/2016 1526   CO2 30 07/03/2016 1526   GLUCOSE 261 (H) 07/03/2016 1526   BUN 19 07/03/2016 1526   CREATININE 1.38 07/03/2016 1526   CALCIUM 10.2 07/03/2016 1526    Lipid Panel     Component Value Date/Time   CHOL 142 07/03/2016 1526   TRIG 133.0 07/03/2016 1526   HDL 40.00 07/03/2016 1526   CHOLHDL 4 07/03/2016 1526   VLDL 26.6 07/03/2016 1526   LDLCALC 76 07/03/2016 1526    CBC    Component Value Date/Time   WBC 6.8 07/03/2016 1526   RBC 5.36 07/03/2016 1526   HGB 15.0 07/03/2016 1526   HCT 45.7 07/03/2016 1526   PLT 179.0 07/03/2016 1526   MCV 85.3 07/03/2016 1526   MCHC 32.8 07/03/2016 1526   RDW 15.2 07/03/2016 1526    Hgb A1C Lab Results  Component Value Date   HGBA1C 8.7 (H) 07/03/2016        Assessment & Plan:   Preventative Health Maintenance:  He declines flu and pneumovax Tetanus UTD Colonoscopy due 2020 Encouraged him to  consume a balanced diet and exercise regimen Advised him to see an eye doctor and dentist annually Will check CMET, A1C and PSA at the end of January  RTC in 3 months to follow up DM 2, HTN Emree Locicero, NP

## 2016-08-22 NOTE — Assessment & Plan Note (Signed)
Referral placed to general surgery to discuss bariatric surgery

## 2016-08-22 NOTE — Patient Instructions (Signed)

## 2016-09-14 ENCOUNTER — Other Ambulatory Visit: Payer: Self-pay | Admitting: Internal Medicine

## 2016-09-14 DIAGNOSIS — E1165 Type 2 diabetes mellitus with hyperglycemia: Secondary | ICD-10-CM

## 2016-09-28 ENCOUNTER — Other Ambulatory Visit (INDEPENDENT_AMBULATORY_CARE_PROVIDER_SITE_OTHER): Payer: BLUE CROSS/BLUE SHIELD

## 2016-09-28 DIAGNOSIS — E1165 Type 2 diabetes mellitus with hyperglycemia: Secondary | ICD-10-CM | POA: Diagnosis not present

## 2016-09-28 LAB — HEMOGLOBIN A1C: HEMOGLOBIN A1C: 7.8 % — AB (ref 4.6–6.5)

## 2016-10-04 MED ORDER — SITAGLIPTIN PHOSPHATE 50 MG PO TABS: 50.0000 mg | ORAL_TABLET | Freq: Every day | ORAL | 1 refills | Status: DC

## 2016-10-04 NOTE — Addendum Note (Signed)
Addended by: Lurlean Nanny on: 10/04/2016 04:04 PM   Modules accepted: Orders

## 2016-10-20 ENCOUNTER — Other Ambulatory Visit (HOSPITAL_COMMUNITY): Payer: Self-pay | Admitting: General Surgery

## 2016-10-27 ENCOUNTER — Telehealth: Payer: Self-pay | Admitting: Internal Medicine

## 2016-10-27 NOTE — Telephone Encounter (Signed)
Called patient to schedule an appt per request from [facility]   Patient is established with Dr. Juanell Fairly and due for routine fu in May 2018.  lmov to call office.

## 2016-11-02 ENCOUNTER — Encounter: Payer: Self-pay | Admitting: Skilled Nursing Facility1

## 2016-11-02 ENCOUNTER — Encounter: Payer: BLUE CROSS/BLUE SHIELD | Attending: General Surgery | Admitting: Skilled Nursing Facility1

## 2016-11-02 DIAGNOSIS — E1165 Type 2 diabetes mellitus with hyperglycemia: Secondary | ICD-10-CM

## 2016-11-02 DIAGNOSIS — G4730 Sleep apnea, unspecified: Secondary | ICD-10-CM | POA: Diagnosis not present

## 2016-11-02 DIAGNOSIS — I10 Essential (primary) hypertension: Secondary | ICD-10-CM | POA: Diagnosis not present

## 2016-11-02 DIAGNOSIS — Z6841 Body Mass Index (BMI) 40.0 and over, adult: Secondary | ICD-10-CM | POA: Insufficient documentation

## 2016-11-02 DIAGNOSIS — E119 Type 2 diabetes mellitus without complications: Secondary | ICD-10-CM | POA: Insufficient documentation

## 2016-11-02 DIAGNOSIS — E785 Hyperlipidemia, unspecified: Secondary | ICD-10-CM | POA: Insufficient documentation

## 2016-11-02 DIAGNOSIS — Z713 Dietary counseling and surveillance: Secondary | ICD-10-CM | POA: Insufficient documentation

## 2016-11-02 NOTE — Progress Notes (Signed)
Pre-Op Assessment Visit:  Pre-Operative Sleeve gastrectomy Surgery  Medical Nutrition Therapy:  Appt start time: 3:02 End time:  4:00  Patient was seen on 11/02/2016 for Pre-Operative Nutrition Assessment. Assessment and letter of approval faxed to [facility] Bariatric Surgery Program coordinator on 11/02/2016.  Pt states he is annoyed about the computer systems not being linked. Pts A1C is 7.8 checks his sugar once a day: between 91-171. Pt states he works 60-66 hours a week.  Start weight at [facility]: 351.4 pounds BMI: 58.03  24 hr Dietary Recall: First Meal: skipped---trying protein shake---cheese and almonds Snack:  Second Meal: grilled chicken sandwich and fries Snack:  Third Meal: pot roast with carrots and poatoes Snack:  Beverages: soda, green tea  Encouraged to engage in 150 minutes of moderate physical activity including cardiovascular and weight baring weekly  Handouts given during visit include:  . Pre-Op Goals . Bariatric Surgery Protein Shakes During the appointment today the following Pre-Op Goals were reviewed with the patient: . Maintain or lose weight as instructed by your surgeon . Make healthy food choices . Begin to limit portion sizes . Limited concentrated sugars and fried foods . Keep fat/sugar in the single digits per serving on             food labels . Practice CHEWING your food  (aim for 30 chews per bite or until applesauce consistency) . Practice not drinking 15 minutes before, during, and 30 minutes after each meal/snack . Avoid all carbonated beverages  . Avoid/limit caffeinated beverages  . Avoid all sugar-sweetened beverages . Consume 3 meals per day; eat every 3-5 hours . Make a list of non-food related activities . Aim for 64-100 ounces of FLUID daily  . Aim for at least 60-80 grams of PROTEIN daily . Look for a liquid protein source that contain ?15 g protein and ?5 g carbohydrate  (ex: shakes, drinks, shots)  -Follow diet  recommendations listed below   Energy and Macronutrient Recomendations: Calories: 1800 Carbohydrate: 200 Protein: 135 Fat: 50  Demonstrated degree of understanding via:  Teach Back  Teaching Method Utilized:  Visual Auditory Hands on  Barriers to learning/adherence to lifestyle change: none identified   Patient to call the Nutrition and Diabetes Education Services to enroll in Pre-Op and Post-Op Nutrition Education when surgery date is scheduled.

## 2016-11-02 NOTE — Patient Instructions (Addendum)
Follow Pre-Op Goals Try Protein Shakes Call [facility] at [phone] when surgery is scheduled to enroll in Pre-Op Class  Things to remember:  Please always be honest with [place]. We want to support you!  If you have any questions or concerns in between appointments, please call or email   The diet after surgery will be high protein and low in carbohydrate.  Vitamins and calcium need to be taken for the rest of your life.  Feel free to include support people in any classes or appointments.  Below 70 is low blood sugar have fast acting sugar: juice, regular soda, hard candy, glucose tablets, plain crackers then check blood sugar 15 minutes later if above 70 have a meal with protein in it if not above 70 repeat.  Supplement recommendations:  Before Surgery   1 Complete Multivitamin with Iron  3000 IU Vitamin D3  After Surgery   2 Chewable Multivitamins  **Best Choice - Bariatric Advantage Advanced Multi EA      3 Chewable Calcium (500 mg each, total 1200-1500 mg per day)  **Best Choice - Celebrate, Bariatric Advantage, or Wellesse Other Options:  2 Celebrate MultiComplete with 18 mg Iron (this provides 6000 IU of  Vitamin D3)  4 Celebrate Essential Multi 2 in 1 (has calcium) + 18-60 mg separate  iron  Vitamins and Calcium are available at:   [facility]   [address], [place], [place] [address]   [email or web address]  www.[email or web address]  www.amazon.com

## 2016-11-06 ENCOUNTER — Telehealth: Payer: Self-pay | Admitting: Internal Medicine

## 2016-11-06 ENCOUNTER — Telehealth: Payer: Self-pay | Admitting: *Deleted

## 2016-11-06 NOTE — Telephone Encounter (Signed)
PT sent in the following message via MyChart. Please advise.  Appointment Request From: Devon Rios    With Provider: Webb Silversmith, NP [[facility]    Preferred Date Range: From 11/07/2016 To 11/10/2016    Preferred Times: Any    Reason for visit: Office Visit    Comments:  Good morning     I need to talk with Devon Rios about weight loss surgery she and I discussed back in January 2018 and supervised weight loss options ASAP. I have already meet with Surgeon and several other requirements have been met but need to follow up with [facility] asap. A simple phone call may handle it. my cell # [phone]

## 2016-11-06 NOTE — Telephone Encounter (Signed)
Left message on voicemail.

## 2016-11-06 NOTE — Telephone Encounter (Signed)
Mel Can you call him and see if he needs an appt or if he needs paperwork filled out or what.

## 2016-11-08 ENCOUNTER — Other Ambulatory Visit: Payer: Self-pay

## 2016-11-08 ENCOUNTER — Ambulatory Visit (HOSPITAL_COMMUNITY)
Admission: RE | Admit: 2016-11-08 | Discharge: 2016-11-08 | Disposition: A | Discharge status: Home / Self Care | Payer: BLUE CROSS/BLUE SHIELD | Source: Ambulatory Visit | Attending: General Surgery | Admitting: General Surgery

## 2016-11-08 DIAGNOSIS — R9431 Abnormal electrocardiogram [ECG] [EKG]: Secondary | ICD-10-CM | POA: Diagnosis not present

## 2016-11-08 DIAGNOSIS — I517 Cardiomegaly: Secondary | ICD-10-CM | POA: Insufficient documentation

## 2016-11-08 DIAGNOSIS — Z01810 Encounter for preprocedural cardiovascular examination: Secondary | ICD-10-CM | POA: Diagnosis present

## 2016-11-08 NOTE — Telephone Encounter (Signed)
Pt has an appt scheduled for 11/13/16

## 2016-11-08 NOTE — Telephone Encounter (Signed)
Patient returned Melanie's call.  Patient can be reached at [phone].

## 2016-11-14 ENCOUNTER — Ambulatory Visit: Payer: BLUE CROSS/BLUE SHIELD | Admitting: Internal Medicine

## 2016-11-14 ENCOUNTER — Encounter: Payer: Self-pay | Admitting: Internal Medicine

## 2016-11-20 ENCOUNTER — Ambulatory Visit (INDEPENDENT_AMBULATORY_CARE_PROVIDER_SITE_OTHER): Payer: BLUE CROSS/BLUE SHIELD | Admitting: Internal Medicine

## 2016-11-20 ENCOUNTER — Encounter: Payer: Self-pay | Admitting: Internal Medicine

## 2016-11-20 VITALS — BP 140/78 | HR 81 | Temp 98.4°F | Wt 356.0 lb

## 2016-11-20 DIAGNOSIS — E119 Type 2 diabetes mellitus without complications: Secondary | ICD-10-CM

## 2016-11-20 DIAGNOSIS — I10 Essential (primary) hypertension: Secondary | ICD-10-CM

## 2016-11-20 DIAGNOSIS — G4733 Obstructive sleep apnea (adult) (pediatric): Secondary | ICD-10-CM

## 2016-11-20 MED ORDER — SITAGLIPTIN PHOSPHATE 50 MG PO TABS
50.0000 mg | ORAL_TABLET | Freq: Every day | ORAL | 1 refills | Status: DC
Start: 2016-11-20 — End: 2017-02-26

## 2016-11-20 NOTE — Patient Instructions (Signed)

## 2016-11-20 NOTE — Progress Notes (Signed)
Subjective:    Patient ID: Devon Rios, male    DOB: [birth year], 51 y.o.   MRN: [id number]  HPI  Pt presents to the clinic today to follow up on morbid obesity. I had recommended weight loss surgery in October 2017. Pt did research on his own, and already went to a bariatric seminar. He is interested in having the gastric sleeve done. He has met with the surgeon and psychiatrist. He has had the upper GI already. He reports he has to have 6 months of medically managed weight loss. He is already seeing a nutritionist to help with diabetes management. His weight today is 356 lbs with a BMI of 58.79. He has a history of DM 2, HTN and OSA.   Breakfast: Protein shake, P3 snack and banana. Lunch: Grilled chicken sandwich, salad, and Crystal [employer] Dinner: Whatever his wife cooks. Snacks: Fruit Exercise: None   Review of Systems      Past Medical History:  Diagnosis Date  . Chicken pox   . DM2 (diabetes mellitus, type 2) ([place]) 07/21/2014  . Frequent headaches   . Hypertension   . OSA on CPAP 2013  . Severe obesity (BMI >= 40) ([facility]) 07/14/2014    Current Outpatient Prescriptions  Medication Sig Dispense Refill  . glipiZIDE (GLUCOTROL) 10 MG tablet TAKE ONE TABLET BY MOUTH TWICE DAILY BEFORE A MEAL 180 tablet 0  . glucose blood (ONE TOUCH TEST STRIPS) test strip 1 each by Other route 2 (two) times daily. Ultra Dx E11.9 100 each 6  . losartan-hydrochlorothiazide (HYZAAR) 100-25 MG tablet Take 1 tablet by mouth daily. 90 tablet 1  . Multiple Vitamin (MULTIVITAMIN) tablet Take 1 tablet by mouth daily.    . Omega-3 Fatty Acids (FISH OIL) 1000 MG CAPS Take 1 capsule by mouth daily.    [name] DELICA LANCETS 85U MISC 1 each by Does not apply route 2 (two) times daily. Dx E11.9 100 each 6  . sitaGLIPtin (JANUVIA) 50 MG tablet Take 1 tablet (50 mg total) by mouth daily. 90 tablet 1   No current facility-administered medications for this visit.     No Known Allergies  Family History    Problem Relation Age of Onset  . Hypertension Mother   . Diabetes Mother   . Cancer Maternal Grandmother     Lung  . Stroke Neg Hx     Social History   Social History  . Marital status: Married    Spouse name: N/A  . Number of children: N/A  . Years of education: N/A   Occupational History  . Not on file.   Social History Main Topics  . Smoking status: Never Smoker  . Smokeless tobacco: Never Used  . Alcohol use 0.0 oz/week     Comment: never  . Drug use: No  . Sexual activity: Yes   Other Topics Concern  . Not on file   Social History Narrative  . No narrative on file     Constitutional: Denies fever, malaise, fatigue, headache or abrupt weight changes.  Gastrointestinal: Denies abdominal pain, bloating, constipation, diarrhea or blood in the stool.  Neurological: Denies dizziness, difficulty with memory, difficulty with speech or problems with balance and coordination.  Psych: Denies anxiety, depression, SI/HI.  No other specific complaints in a complete review of systems (except as listed in HPI above).  Objective:   Physical Exam   BP 140/78   Pulse 81   Temp 98.4 F (36.9 C) (Oral)   Wt Marland Kitchen)  356 lb (161.5 kg)   SpO2 97%   BMI 58.79 kg/m  Wt Readings from Last 3 Encounters:  11/20/16 (!) 356 lb (161.5 kg)  11/02/16 (!) 351 lb 6.4 oz (159.4 kg)  08/22/16 (!) 344 lb (156 kg)    General: Appears his stated age, obese in NAD.Marland Kitchen  Cardiovascular: Normal rate and rhythm.  Pulmonary/Chest: Normal effort and positive vesicular breath sounds. No respiratory distress. No wheezes, rales or ronchi noted.  Neurological: Alert and oriented.  Psychiatric: Mood and affect normal. Behavior is normal. Judgment and thought content normal.    BMET    Component Value Date/Time   NA 140 07/03/2016 1526   K 4.3 07/03/2016 1526   CL 101 07/03/2016 1526   CO2 30 07/03/2016 1526   GLUCOSE 261 (H) 07/03/2016 1526   BUN 19 07/03/2016 1526   CREATININE 1.38  07/03/2016 1526   CALCIUM 10.2 07/03/2016 1526    Lipid Panel     Component Value Date/Time   CHOL 142 07/03/2016 1526   TRIG 133.0 07/03/2016 1526   HDL 40.00 07/03/2016 1526   CHOLHDL 4 07/03/2016 1526   VLDL 26.6 07/03/2016 1526   LDLCALC 76 07/03/2016 1526    CBC    Component Value Date/Time   WBC 6.8 07/03/2016 1526   RBC 5.36 07/03/2016 1526   HGB 15.0 07/03/2016 1526   HCT 45.7 07/03/2016 1526   PLT 179.0 07/03/2016 1526   MCV 85.3 07/03/2016 1526   MCHC 32.8 07/03/2016 1526   RDW 15.2 07/03/2016 1526    Hgb A1C Lab Results  Component Value Date   HGBA1C 7.8 (H) 09/28/2016           Assessment & Plan:   DM 2, HTN, OSA worsened by Morbid Obesity:  I am glad he has made the decision to proceed with weight loss surgery His diet seems to be going well Advised him to start walking for 30 minutes  3 days per week Continue to see the nutritionist  RTC in 1 month for weight management Webb Silversmith, NP

## 2016-11-26 ENCOUNTER — Encounter: Payer: Self-pay | Admitting: Internal Medicine

## 2016-11-27 ENCOUNTER — Encounter: Payer: Self-pay | Admitting: Internal Medicine

## 2016-11-27 ENCOUNTER — Ambulatory Visit (INDEPENDENT_AMBULATORY_CARE_PROVIDER_SITE_OTHER): Payer: BLUE CROSS/BLUE SHIELD | Admitting: Internal Medicine

## 2016-11-27 VITALS — BP 142/84 | HR 91 | Wt 357.0 lb

## 2016-11-27 DIAGNOSIS — G4733 Obstructive sleep apnea (adult) (pediatric): Secondary | ICD-10-CM

## 2016-11-27 NOTE — Addendum Note (Signed)
Addended by: Renelda Mom on: 11/27/2016 04:35 PM   Modules accepted: Orders

## 2016-11-27 NOTE — Progress Notes (Signed)
[facility] Consultation      Assessment and Plan:  Obstructive sleep apnea. -The patient's sleep apnea appears to be adequately treated, he is using his VPAP auto machine daily, approximately 8 hours per day, setting is 22/16, spontaneous. Residual apnea is minimal. -We will give the prescription for renewal of Pap supplies, the patient was instructed that should he have difficulty getting supplies covered by insurance, he could always purchase them online at [email or web address] or [employer] out-of-pocket.  Insomnia. -Appears predominantly maintenance type of insomnia, doing well with melatonin.   Pre-op surgical clearance.  --He is being evaluated for gastric sleeve surgery for weight reduction, he is cleared from our standpoint for this procedure.    Date: 11/27/2016  MRN# [id number] Sang Blount 23-Mar-1966  Referring Physician: Dr. Garnette Gunner.   Devon Rios is a 51 y.o. old male seen in consultation for chief complaint of:    Chief Complaint  Patient presents with  . Follow-up    CPAP: does well on CPAP when using: needs supplies    HPI:   The patient is a 51 year old male with a known history of obstructive sleep apnea and insomnia.. Last visit he was noted to be on CPAP at a pressure of 22/16. He was seen because he needed a new CPAP supplies and will establish with a new provider.  He has not had his supplies replaced in about a year.  He continues on prn melatonin prn.   **download 11/26/16; residual AHI 0.3; bipap 22/16.  **download data from the patient's VPAP auto on 01/03/2016: Machine-S9 VPAP-Auto; spontaneous mode, 22/16. Residual AHI is 0.3, average daily usage is 8 hours and 14 minutes on 100% of days.  Medication:    reviewed.    Allergies:  Patient has no known allergies.  Review of Systems: Gen:  Denies  fever, sweats, chills HEENT: Denies blurred vision, double vision. bleeds, sore throat Cvc:  No dizziness, chest pain. Resp:   Denies cough or  sputum production, shortness of breath Gi: Denies swallowing difficulty, stomach pain. Gu:  Denies bladder incontinence, burning urine Ext:   No Joint pain, stiffness. Skin: No skin rash,  hives  Endoc:  No polyuria, polydipsia. Psych: No depression, insomnia. Other:  All other systems were reviewed with the patient and were negative other that what is mentioned in the HPI.   Physical Examination:   VS: BP (!) 142/84 (BP Location: Left Arm, Cuff Size: Normal)   Pulse 91   Wt (!) 357 lb (161.9 kg)   SpO2 97%   BMI 58.95 kg/m   General Appearance: No distress  Neuro:without focal findings,  speech normal,  HEENT: PERRLA, EOM intact.   Pulmonary: normal breath sounds, No wheezing.  CardiovascularNormal S1,S2.  No m/r/g.   Abdomen: Benign, Soft, non-tender. Renal:  No costovertebral tenderness  GU:  No performed at this time. Endoc: No evident thyromegaly, no signs of acromegaly. Skin:   warm, no rashes, no ecchymosis  Extremities: normal, no cyanosis, clubbing.  Other findings:    LABORATORY PANEL:   CBC No results for input(s): WBC, HGB, HCT, PLT in the last 168 hours. ------------------------------------------------------------------------------------------------------------------  Chemistries  No results for input(s): NA, K, CL, CO2, GLUCOSE, BUN, CREATININE, CALCIUM, MG, AST, ALT, ALKPHOS, BILITOT in the last 168 hours.  Invalid input(s): GFRCGP ------------------------------------------------------------------------------------------------------------------  Cardiac Enzymes No results for input(s): TROPONINI in the last 168 hours. ------------------------------------------------------------  RADIOLOGY:  No results found.     Thank  you for the consultation and for allowing  [facility] to assist in the care of your patient. Our recommendations are noted above.  Please contact us if we can be of further service.   Marda Stalker,  MD.  Board Certified in Internal Medicine, Pulmonary Medicine, [place], and Sleep Medicine.   Pulmonary and Critical Care Office Number: [phone]  Patricia Pesa, M.D.  Vilinda Boehringer, M.D.  Merton Border, M.D  11/27/2016

## 2016-11-27 NOTE — Patient Instructions (Addendum)
Will renew supplies, for CPAP.   Cleared for potential gastric sleeve surgery from respiratory/sleep standpoint.

## 2016-12-25 ENCOUNTER — Encounter: Payer: Self-pay | Admitting: Internal Medicine

## 2016-12-25 ENCOUNTER — Ambulatory Visit (INDEPENDENT_AMBULATORY_CARE_PROVIDER_SITE_OTHER): Payer: BLUE CROSS/BLUE SHIELD | Admitting: Internal Medicine

## 2016-12-25 DIAGNOSIS — M25561 Pain in right knee: Secondary | ICD-10-CM | POA: Diagnosis not present

## 2016-12-25 DIAGNOSIS — M25562 Pain in left knee: Secondary | ICD-10-CM

## 2016-12-25 DIAGNOSIS — G4733 Obstructive sleep apnea (adult) (pediatric): Secondary | ICD-10-CM

## 2016-12-25 NOTE — Progress Notes (Signed)
Subjective:    Patient ID: Devon Rios, male    DOB: [birth year], 51 y.o.   MRN: [id number]  HPI  Pt presents for his 2nd monthly follow up for morbid obesity. He is planning on having a gastric sleeve done. He has been following with a nutrititionist. His weight today is 358.12 lbs with a BMI of 59.24. He has gained 2 lbs since his last visit. He has a history of DM 2, HTN and OSA. He reports over the last month, he has not had any changes in his diet. He reports he has started walking 1.5 miles, 2 days a week. He reports now that he has started exercising, his knees seem to be bothering him more. He would like to know if there is something topical that he can use to help his knee pain.    Review of Systems      Past Medical History:  Diagnosis Date  . Chicken pox   . DM2 (diabetes mellitus, type 2) ([place]) 07/21/2014  . Frequent headaches   . Hypertension   . OSA on CPAP 2013  . Severe obesity (BMI >= 40) ([facility]) 07/14/2014    Current Outpatient Prescriptions  Medication Sig Dispense Refill  . glipiZIDE (GLUCOTROL) 10 MG tablet TAKE ONE TABLET BY MOUTH TWICE DAILY BEFORE A MEAL 180 tablet 0  . glucose blood (ONE TOUCH TEST STRIPS) test strip 1 each by Other route 2 (two) times daily. Ultra Dx E11.9 100 each 6  . losartan-hydrochlorothiazide (HYZAAR) 100-25 MG tablet Take 1 tablet by mouth daily. 90 tablet 1  . Multiple Vitamin (MULTIVITAMIN) tablet Take 1 tablet by mouth daily.    . Omega-3 Fatty Acids (FISH OIL) 1000 MG CAPS Take 1 capsule by mouth daily.    [name] DELICA LANCETS 81E MISC 1 each by Does not apply route 2 (two) times daily. Dx E11.9 100 each 6  . sitaGLIPtin (JANUVIA) 50 MG tablet Take 1 tablet (50 mg total) by mouth daily. 90 tablet 1   No current facility-administered medications for this visit.     No Known Allergies  Family History  Problem Relation Age of Onset  . Hypertension Mother   . Diabetes Mother   . Cancer Maternal Grandmother     Lung  .  Stroke Neg Hx     Social History   Social History  . Marital status: Married    Spouse name: N/A  . Number of children: N/A  . Years of education: N/A   Occupational History  . Not on file.   Social History Main Topics  . Smoking status: Never Smoker  . Smokeless tobacco: Never Used  . Alcohol use 0.0 oz/week     Comment: never  . Drug use: No  . Sexual activity: Yes   Other Topics Concern  . Not on file   Social History Narrative  . No narrative on file     Constitutional: Pt reports weight gain. Denies fever, malaise, fatigue, headache.  Musculoskeletal: Pt reports bilateral knee pain. Denies decrease in range of motion, difficulty with gait, muscle pain or joint swelling.   No other specific complaints in a complete review of systems (except as listed in HPI above).  Objective:   Physical Exam  BP 138/76   Pulse 73   Temp 98.2 F (36.8 C) (Oral)   Wt (!) 358 lb 12 oz (162.7 kg)   SpO2 97%   BMI 59.24 kg/m  Wt Readings from Last 3 Encounters:  12/25/16 Marland Kitchen)  358 lb 12 oz (162.7 kg)  11/27/16 (!) 357 lb (161.9 kg)  11/20/16 (!) 356 lb (161.5 kg)    General: Appears his stated age, obese in NAD. Musculoskeletal: Normal flexion and extension of bilateral knees. No signs of joint swelling. No pain with palpation of the knees. No difficulty with gait.    BMET    Component Value Date/Time   NA 140 07/03/2016 1526   K 4.3 07/03/2016 1526   CL 101 07/03/2016 1526   CO2 30 07/03/2016 1526   GLUCOSE 261 (H) 07/03/2016 1526   BUN 19 07/03/2016 1526   CREATININE 1.38 07/03/2016 1526   CALCIUM 10.2 07/03/2016 1526    Lipid Panel     Component Value Date/Time   CHOL 142 07/03/2016 1526   TRIG 133.0 07/03/2016 1526   HDL 40.00 07/03/2016 1526   CHOLHDL 4 07/03/2016 1526   VLDL 26.6 07/03/2016 1526   LDLCALC 76 07/03/2016 1526    CBC    Component Value Date/Time   WBC 6.8 07/03/2016 1526   RBC 5.36 07/03/2016 1526   HGB 15.0 07/03/2016 1526   HCT  45.7 07/03/2016 1526   PLT 179.0 07/03/2016 1526   MCV 85.3 07/03/2016 1526   MCHC 32.8 07/03/2016 1526   RDW 15.2 07/03/2016 1526    Hgb A1C Lab Results  Component Value Date   HGBA1C 7.8 (H) 09/28/2016         Assessment & Plan:   Morbid Obesity:  He will continue to work with nutrition He will increase his activity level for a goal of 3 days per week  Bilateral Knee Pain:  Encouraged him to continue to stay active Try Biofreeze OTC  RTC in 1 month for weight check/med refill Webb Silversmith, NP

## 2016-12-25 NOTE — Patient Instructions (Signed)
Knee Exercises Ask your health care provider which exercises are safe for you. Do exercises exactly as told by your health care provider and adjust them as directed. It is normal to feel mild stretching, pulling, tightness, or discomfort as you do these exercises, but you should stop right away if you feel sudden pain or your pain gets worse.Do not begin these exercises until told by your health care provider. STRETCHING AND RANGE OF MOTION EXERCISES  These exercises warm up your muscles and joints and improve the movement and flexibility of your knee. These exercises also help to relieve pain, numbness, and tingling. Exercise A: Knee Extension, Prone  1. Lie on your abdomen on a bed. 2. Place your left / right knee just beyond the edge of the surface so your knee is not on the bed. You can put a towel under your left / right thigh just above your knee for comfort. 3. Relax your leg muscles and allow gravity to straighten your knee. You should feel a stretch behind your left / right knee. 4. Hold this position for __________ seconds. 5. Scoot up so your knee is supported between repetitions. Repeat __________ times. Complete this stretch __________ times a day. Exercise B: Knee Flexion, Active   1. Lie on your back with both knees straight. If this causes back discomfort, bend your left / right knee so your foot is flat on the floor. 2. Slowly slide your left / right heel back toward your buttocks until you feel a gentle stretch in the front of your knee or thigh. 3. Hold this position for __________ seconds. 4. Slowly slide your left / right heel back to the starting position. Repeat __________ times. Complete this exercise __________ times a day. Exercise C: Quadriceps, Prone   1. Lie on your abdomen on a firm surface, such as a bed or padded floor. 2. Bend your left / right knee and hold your ankle. If you cannot reach your ankle or pant leg, loop a belt around your foot and grab the belt  instead. 3. Gently pull your heel toward your buttocks. Your knee should not slide out to the side. You should feel a stretch in the front of your thigh and knee. 4. Hold this position for __________ seconds. Repeat __________ times. Complete this stretch __________ times a day. Exercise D: Hamstring, Supine  1. Lie on your back. 2. Loop a belt or towel over the ball of your left / right foot. The ball of your foot is on the walking surface, right under your toes. 3. Straighten your left / right knee and slowly pull on the belt to raise your leg until you feel a gentle stretch behind your knee.  Do not let your left / right knee bend while you do this.  Keep your other leg flat on the floor. 4. Hold this position for __________ seconds. Repeat __________ times. Complete this stretch __________ times a day. STRENGTHENING EXERCISES  These exercises build strength and endurance in your knee. Endurance is the ability to use your muscles for a long time, even after they get tired. Exercise E: Quadriceps, Isometric   1. Lie on your back with your left / right leg extended and your other knee bent. Put a rolled towel or small pillow under your knee if told by your health care provider. 2. Slowly tense the muscles in the front of your left / right thigh. You should see your kneecap slide up toward your hip or see  increased dimpling just above the knee. This motion will push the back of the knee toward the floor. 3. For __________ seconds, keep the muscle as tight as you can without increasing your pain. 4. Relax the muscles slowly and completely. Repeat __________ times. Complete this exercise __________ times a day. Exercise F: Straight Leg Raises - Quadriceps  1. Lie on your back with your left / right leg extended and your other knee bent. 2. Tense the muscles in the front of your left / right thigh. You should see your kneecap slide up or see increased dimpling just above the knee. Your thigh  may even shake a bit. 3. Keep these muscles tight as you raise your leg 4-6 inches (10-15 cm) off the floor. Do not let your knee bend. 4. Hold this position for __________ seconds. 5. Keep these muscles tense as you lower your leg. 6. Relax your muscles slowly and completely after each repetition. Repeat __________ times. Complete this exercise __________ times a day. Exercise G: Hamstring, Isometric  1. Lie on your back on a firm surface. 2. Bend your left / right knee approximately __________ degrees. 3. Dig your left / right heel into the surface as if you are trying to pull it toward your buttocks. Tighten the muscles in the back of your thighs to dig as hard as you can without increasing any pain. 4. Hold this position for __________ seconds. 5. Release the tension gradually and allow your muscles to relax completely for __________ seconds after each repetition. Repeat __________ times. Complete this exercise __________ times a day. Exercise H: Hamstring Curls   If told by your health care provider, do this exercise while wearing ankle weights. Begin with __________ weights. Then increase the weight by 1 lb (0.5 kg) increments. Do not wear ankle weights that are more than __________. 1. Lie on your abdomen with your legs straight. 2. Bend your left / right knee as far as you can without feeling pain. Keep your hips flat against the floor. 3. Hold this position for __________ seconds. 4. Slowly lower your leg to the starting position. Repeat __________ times. Complete this exercise __________ times a day. Exercise I: Squats (Quadriceps)  1. Stand in front of a table, with your feet and knees pointing straight ahead. You may rest your hands on the table for balance but not for support. 2. Slowly bend your knees and lower your hips like you are going to sit in a chair.  Keep your weight over your heels, not over your toes.  Keep your lower legs upright so they are parallel with the  table legs.  Do not let your hips go lower than your knees.  Do not bend lower than told by your health care provider.  If your knee pain increases, do not bend as low. 3. Hold the squat position for __________ seconds. 4. Slowly push with your legs to return to standing. Do not use your hands to pull yourself to standing. Repeat __________ times. Complete this exercise __________ times a day. Exercise J: Wall Slides (Quadriceps)   1. Lean your back against a smooth wall or door while you walk your feet out 18-24 inches (46-61 cm) from it. 2. Place your feet hip-width apart. 3. Slowly slide down the wall or door until your knees Repeat __________ times. Complete this exercise __________ times a day. 4. Exercise K: Straight Leg Raises - Hip Abductors  1. Lie on your side with your left / right leg   in the top position. Lie so your head, shoulder, knee, and hip line up. You may bend your bottom knee to help you keep your balance. 2. Roll your hips slightly forward so your hips are stacked directly over each other and your left / right knee is facing forward. 3. Leading with your heel, lift your top leg 4-6 inches (10-15 cm). You should feel the muscles in your outer hip lifting.  Do not let your foot drift forward.  Do not let your knee roll toward the ceiling. 4. Hold this position for __________ seconds. 5. Slowly return your leg to the starting position. 6. Let your muscles relax completely after each repetition. Repeat __________ times. Complete this exercise __________ times a day. Exercise L: Straight Leg Raises - Hip Extensors  1. Lie on your abdomen on a firm surface. You can put a pillow under your hips if that is more comfortable. 2. Tense the muscles in your buttocks and lift your left / right leg about 4-6 inches (10-15 cm). Keep your knee straight as you lift your leg. 3. Hold this position for __________ seconds. 4. Slowly lower your leg to the starting position. 5. Let  your leg relax completely after each repetition. Repeat __________ times. Complete this exercise __________ times a day. This information is not intended to replace advice given to you by your health care provider. Make sure you discuss any questions you have with your health care provider. Document Released: 07/05/2005 Document Revised: 05/15/2016 Document Reviewed: 06/27/2015 Elsevier Interactive Patient Education  2017 [employer].

## 2016-12-25 NOTE — Telephone Encounter (Signed)
RX for CPAP machine printed and signe, please fax to number given and let pt know once this is complete

## 2016-12-26 ENCOUNTER — Telehealth: Payer: Self-pay | Admitting: Internal Medicine

## 2016-12-26 NOTE — Telephone Encounter (Signed)
Patient dropped off sleep med [facility] physicians prescription certificate of medical necessity form In [facility] up fron

## 2016-12-28 NOTE — Telephone Encounter (Signed)
Done, placed in MYD box 

## 2016-12-28 NOTE — Telephone Encounter (Addendum)
Form faxed with OV notes as instructed

## 2017-01-04 ENCOUNTER — Encounter: Payer: Self-pay | Admitting: Internal Medicine

## 2017-01-23 ENCOUNTER — Encounter: Payer: Self-pay | Admitting: Internal Medicine

## 2017-01-23 ENCOUNTER — Ambulatory Visit (INDEPENDENT_AMBULATORY_CARE_PROVIDER_SITE_OTHER): Payer: BLUE CROSS/BLUE SHIELD | Admitting: Internal Medicine

## 2017-01-23 ENCOUNTER — Other Ambulatory Visit: Payer: Self-pay | Admitting: Internal Medicine

## 2017-01-23 ENCOUNTER — Ambulatory Visit (INDEPENDENT_AMBULATORY_CARE_PROVIDER_SITE_OTHER)
Admission: RE | Admit: 2017-01-23 | Discharge: 2017-01-23 | Disposition: A | Discharge status: Home / Self Care | Payer: BLUE CROSS/BLUE SHIELD | Source: Ambulatory Visit | Attending: Internal Medicine | Admitting: Internal Medicine

## 2017-01-23 VITALS — BP 134/86 | HR 83 | Temp 97.8°F | Wt 356.0 lb

## 2017-01-23 DIAGNOSIS — M25562 Pain in left knee: Secondary | ICD-10-CM

## 2017-01-23 NOTE — Progress Notes (Signed)
Subjective:    Patient ID: Devon Rios, male    DOB: 09-22-1965, 51 y.o.   MRN: [id number]  HPI  Pt presents to the clinic today for his 3rd monthly follow up for morbid obesity. He is planning on having the gastric sleeve. He continues to follow with the nutritionist. His weight today is 356 lbs with a BMI of 58.79. He has lost 2 lbs since his last visit. He has not made any changes in his diet. He is trying to eat 5 smalls meals a day. The exercise has decreased due to the knee pain. He reports he is walking 1.5 miles, 3 days a week. He has a history of DM 2, HTN and OSA.  He also wants to discuss ongoing left knee pain. He has had pain and swelling for months. The pain is worse with weight bearing. He was advised at his last visit to try Biofreeze, but he reports it has not really helped. The knee pain is interfering with his ability to walk. There is no xray of the left knee on file. He reports he has had a knee injection in the past with good relief.   Review of Systems      Past Medical History:  Diagnosis Date  . Chicken pox   . DM2 (diabetes mellitus, type 2) ([place]) 07/21/2014  . Frequent headaches   . Hypertension   . OSA on CPAP 2013  . Severe obesity (BMI >= 40) ([facility]) 07/14/2014    Current Outpatient Prescriptions  Medication Sig Dispense Refill  . glipiZIDE (GLUCOTROL) 10 MG tablet TAKE ONE TABLET BY MOUTH TWICE DAILY BEFORE A MEAL 180 tablet 0  . glucose blood (ONE TOUCH TEST STRIPS) test strip 1 each by Other route 2 (two) times daily. Ultra Dx E11.9 100 each 6  . losartan-hydrochlorothiazide (HYZAAR) 100-25 MG tablet Take 1 tablet by mouth daily. 90 tablet 1  . Multiple Vitamin (MULTIVITAMIN) tablet Take 1 tablet by mouth daily.    . Omega-3 Fatty Acids (FISH OIL) 1000 MG CAPS Take 1 capsule by mouth daily.    [name] DELICA LANCETS 57W MISC 1 each by Does not apply route 2 (two) times daily. Dx E11.9 100 each 6  . sitaGLIPtin (JANUVIA) 50 MG tablet Take 1 tablet  (50 mg total) by mouth daily. 90 tablet 1   No current facility-administered medications for this visit.     No Known Allergies  Family History  Problem Relation Age of Onset  . Hypertension Mother   . Diabetes Mother   . Cancer Maternal Grandmother        Lung  . Stroke Neg Hx     Social History   Social History  . Marital status: Married    Spouse name: N/A  . Number of children: N/A  . Years of education: N/A   Occupational History  . Not on file.   Social History Main Topics  . Smoking status: Never Smoker  . Smokeless tobacco: Never Used  . Alcohol use 0.0 oz/week     Comment: never  . Drug use: No  . Sexual activity: Yes   Other Topics Concern  . Not on file   Social History Narrative  . No narrative on file     Constitutional: Denies fever, malaise, fatigue, headache or abrupt weight changes.  Musculoskeletal: Pt reports left knee pain and swelling. Denies decrease in range of motion, muscle pain.    No other specific complaints in a complete review of  systems (except as listed in HPI above).  Objective:   Physical Exam   BP 134/86   Pulse 83   Temp 97.8 F (36.6 C) (Oral)   Wt (!) 356 lb (161.5 kg)   SpO2 97%   BMI 58.79 kg/m  Wt Readings from Last 3 Encounters:  01/23/17 (!) 356 lb (161.5 kg)  12/25/16 (!) 358 lb 12 oz (162.7 kg)  11/27/16 (!) 357 lb (161.9 kg)    General: Appears his stated age, obese in NAD. Musculoskeletal: Normal flexion and extension of the left knee. Mild swelling noted medially. Pain with palpation along the medial and lateral joint lines. No difficulty with gait.    BMET    Component Value Date/Time   NA 140 07/03/2016 1526   K 4.3 07/03/2016 1526   CL 101 07/03/2016 1526   CO2 30 07/03/2016 1526   GLUCOSE 261 (H) 07/03/2016 1526   BUN 19 07/03/2016 1526   CREATININE 1.38 07/03/2016 1526   CALCIUM 10.2 07/03/2016 1526    Lipid Panel     Component Value Date/Time   CHOL 142 07/03/2016 1526   TRIG  133.0 07/03/2016 1526   HDL 40.00 07/03/2016 1526   CHOLHDL 4 07/03/2016 1526   VLDL 26.6 07/03/2016 1526   LDLCALC 76 07/03/2016 1526    CBC    Component Value Date/Time   WBC 6.8 07/03/2016 1526   RBC 5.36 07/03/2016 1526   HGB 15.0 07/03/2016 1526   HCT 45.7 07/03/2016 1526   PLT 179.0 07/03/2016 1526   MCV 85.3 07/03/2016 1526   MCHC 32.8 07/03/2016 1526   RDW 15.2 07/03/2016 1526    Hgb A1C Lab Results  Component Value Date   HGBA1C 7.8 (H) 09/28/2016           Assessment & Plan:   Morbid Obesity:  Encouraged him to try to cut back on portion sizes Continue to eat 5-6 smalls meals a day Choose healthy snacks Encouraged him to try to work through the knee pain for exercise  Left Knee Pain:  Xray of left knee today Ice and compression may be helpful He does not want any oral pills if he can help it May need referral to ortho  RTC in 1 month, sooner if needed Webb Silversmith, NP

## 2017-01-23 NOTE — Patient Instructions (Signed)
Knee Exercises Ask your health care provider which exercises are safe for you. Do exercises exactly as told by your health care provider and adjust them as directed. It is normal to feel mild stretching, pulling, tightness, or discomfort as you do these exercises, but you should stop right away if you feel sudden pain or your pain gets worse.Do not begin these exercises until told by your health care provider. STRETCHING AND RANGE OF MOTION EXERCISES  These exercises warm up your muscles and joints and improve the movement and flexibility of your knee. These exercises also help to relieve pain, numbness, and tingling. Exercise A: Knee Extension, Prone  1. Lie on your abdomen on a bed. 2. Place your left / right knee just beyond the edge of the surface so your knee is not on the bed. You can put a towel under your left / right thigh just above your knee for comfort. 3. Relax your leg muscles and allow gravity to straighten your knee. You should feel a stretch behind your left / right knee. 4. Hold this position for __________ seconds. 5. Scoot up so your knee is supported between repetitions. Repeat __________ times. Complete this stretch __________ times a day. Exercise B: Knee Flexion, Active   1. Lie on your back with both knees straight. If this causes back discomfort, bend your left / right knee so your foot is flat on the floor. 2. Slowly slide your left / right heel back toward your buttocks until you feel a gentle stretch in the front of your knee or thigh. 3. Hold this position for __________ seconds. 4. Slowly slide your left / right heel back to the starting position. Repeat __________ times. Complete this exercise __________ times a day. Exercise C: Quadriceps, Prone   1. Lie on your abdomen on a firm surface, such as a bed or padded floor. 2. Bend your left / right knee and hold your ankle. If you cannot reach your ankle or pant leg, loop a belt around your foot and grab the belt  instead. 3. Gently pull your heel toward your buttocks. Your knee should not slide out to the side. You should feel a stretch in the front of your thigh and knee. 4. Hold this position for __________ seconds. Repeat __________ times. Complete this stretch __________ times a day. Exercise D: Hamstring, Supine  1. Lie on your back. 2. Loop a belt or towel over the ball of your left / right foot. The ball of your foot is on the walking surface, right under your toes. 3. Straighten your left / right knee and slowly pull on the belt to raise your leg until you feel a gentle stretch behind your knee.  Do not let your left / right knee bend while you do this.  Keep your other leg flat on the floor. 4. Hold this position for __________ seconds. Repeat __________ times. Complete this stretch __________ times a day. STRENGTHENING EXERCISES  These exercises build strength and endurance in your knee. Endurance is the ability to use your muscles for a long time, even after they get tired. Exercise E: Quadriceps, Isometric   1. Lie on your back with your left / right leg extended and your other knee bent. Put a rolled towel or small pillow under your knee if told by your health care provider. 2. Slowly tense the muscles in the front of your left / right thigh. You should see your kneecap slide up toward your hip or see  increased dimpling just above the knee. This motion will push the back of the knee toward the floor. 3. For __________ seconds, keep the muscle as tight as you can without increasing your pain. 4. Relax the muscles slowly and completely. Repeat __________ times. Complete this exercise __________ times a day. Exercise F: Straight Leg Raises - Quadriceps  1. Lie on your back with your left / right leg extended and your other knee bent. 2. Tense the muscles in the front of your left / right thigh. You should see your kneecap slide up or see increased dimpling just above the knee. Your thigh  may even shake a bit. 3. Keep these muscles tight as you raise your leg 4-6 inches (10-15 cm) off the floor. Do not let your knee bend. 4. Hold this position for __________ seconds. 5. Keep these muscles tense as you lower your leg. 6. Relax your muscles slowly and completely after each repetition. Repeat __________ times. Complete this exercise __________ times a day. Exercise G: Hamstring, Isometric  1. Lie on your back on a firm surface. 2. Bend your left / right knee approximately __________ degrees. 3. Dig your left / right heel into the surface as if you are trying to pull it toward your buttocks. Tighten the muscles in the back of your thighs to dig as hard as you can without increasing any pain. 4. Hold this position for __________ seconds. 5. Release the tension gradually and allow your muscles to relax completely for __________ seconds after each repetition. Repeat __________ times. Complete this exercise __________ times a day. Exercise H: Hamstring Curls   If told by your health care provider, do this exercise while wearing ankle weights. Begin with __________ weights. Then increase the weight by 1 lb (0.5 kg) increments. Do not wear ankle weights that are more than __________. 1. Lie on your abdomen with your legs straight. 2. Bend your left / right knee as far as you can without feeling pain. Keep your hips flat against the floor. 3. Hold this position for __________ seconds. 4. Slowly lower your leg to the starting position. Repeat __________ times. Complete this exercise __________ times a day. Exercise I: Squats (Quadriceps)  1. Stand in front of a table, with your feet and knees pointing straight ahead. You may rest your hands on the table for balance but not for support. 2. Slowly bend your knees and lower your hips like you are going to sit in a chair.  Keep your weight over your heels, not over your toes.  Keep your lower legs upright so they are parallel with the  table legs.  Do not let your hips go lower than your knees.  Do not bend lower than told by your health care provider.  If your knee pain increases, do not bend as low. 3. Hold the squat position for __________ seconds. 4. Slowly push with your legs to return to standing. Do not use your hands to pull yourself to standing. Repeat __________ times. Complete this exercise __________ times a day. Exercise J: Wall Slides (Quadriceps)   1. Lean your back against a smooth wall or door while you walk your feet out 18-24 inches (46-61 cm) from it. 2. Place your feet hip-width apart. 3. Slowly slide down the wall or door until your knees Repeat __________ times. Complete this exercise __________ times a day. 4. Exercise K: Straight Leg Raises - Hip Abductors  1. Lie on your side with your left / right leg   in the top position. Lie so your head, shoulder, knee, and hip line up. You may bend your bottom knee to help you keep your balance. 2. Roll your hips slightly forward so your hips are stacked directly over each other and your left / right knee is facing forward. 3. Leading with your heel, lift your top leg 4-6 inches (10-15 cm). You should feel the muscles in your outer hip lifting.  Do not let your foot drift forward.  Do not let your knee roll toward the ceiling. 4. Hold this position for __________ seconds. 5. Slowly return your leg to the starting position. 6. Let your muscles relax completely after each repetition. Repeat __________ times. Complete this exercise __________ times a day. Exercise L: Straight Leg Raises - Hip Extensors  1. Lie on your abdomen on a firm surface. You can put a pillow under your hips if that is more comfortable. 2. Tense the muscles in your buttocks and lift your left / right leg about 4-6 inches (10-15 cm). Keep your knee straight as you lift your leg. 3. Hold this position for __________ seconds. 4. Slowly lower your leg to the starting position. 5. Let  your leg relax completely after each repetition. Repeat __________ times. Complete this exercise __________ times a day. This information is not intended to replace advice given to you by your health care provider. Make sure you discuss any questions you have with your health care provider. Document Released: 07/05/2005 Document Revised: 05/15/2016 Document Reviewed: 06/27/2015 Elsevier Interactive Patient Education  2017 [employer].

## 2017-01-24 ENCOUNTER — Encounter: Payer: Self-pay | Admitting: Internal Medicine

## 2017-02-04 ENCOUNTER — Other Ambulatory Visit: Payer: Self-pay | Admitting: Internal Medicine

## 2017-02-05 ENCOUNTER — Other Ambulatory Visit: Payer: Self-pay | Admitting: Internal Medicine

## 2017-02-05 DIAGNOSIS — I10 Essential (primary) hypertension: Secondary | ICD-10-CM

## 2017-02-05 MED ORDER — GLIPIZIDE 10 MG PO TABS: 10.0000 mg | ORAL_TABLET | Freq: Two times a day (BID) | ORAL | 0 refills | Status: DC

## 2017-02-26 ENCOUNTER — Encounter: Payer: Self-pay | Admitting: Internal Medicine

## 2017-02-26 ENCOUNTER — Ambulatory Visit (INDEPENDENT_AMBULATORY_CARE_PROVIDER_SITE_OTHER): Payer: BLUE CROSS/BLUE SHIELD | Admitting: Internal Medicine

## 2017-02-26 VITALS — BP 140/98 | HR 78 | Temp 98.0°F | Wt 360.0 lb

## 2017-02-26 DIAGNOSIS — E119 Type 2 diabetes mellitus without complications: Secondary | ICD-10-CM

## 2017-02-26 LAB — HEMOGLOBIN A1C: Hgb A1c MFr Bld: 7.6 % — ABNORMAL HIGH (ref 4.6–6.5)

## 2017-02-26 MED ORDER — SITAGLIPTIN PHOSPHATE 50 MG PO TABS: 50.0000 mg | ORAL_TABLET | Freq: Every day | ORAL | 1 refills | Status: DC

## 2017-02-26 NOTE — Patient Instructions (Signed)
Preventing Unhealthy Weight Gain, Adult Staying at a healthy weight is important. When fat builds up in your body, you may become overweight or obese. These conditions put you at greater risk for developing certain health problems, such as heart disease, diabetes, sleeping problems, joint problems, and some cancers. Unhealthy weight gain is often the result of making unhealthy choices in what you eat. It is also a result of not getting enough exercise. You can make changes to your lifestyle to prevent obesity and stay as healthy as possible. What nutrition changes can be made? To maintain a healthy weight and prevent obesity:  Eat only as much as your body needs. To do this: ? Pay attention to signs that you are hungry or full. Stop eating as soon as you feel full. ? If you feel hungry, try drinking water first. Drink enough water so your urine is clear or pale yellow. ? Eat smaller portions. ? Look at serving sizes on food labels. Most foods contain more than one serving per container. ? Eat the recommended amount of calories for your gender and activity level. While most active people should eat around 2,000 calories per day, if you are trying to lose weight or are not very active, you main need to eat less calories. Talk to your health care provider or dietitian about how many calories you should eat each day.  Choose healthy foods, such as: ? Fruits and vegetables. Try to fill at least half of your plate at each meal with fruits and vegetables. ? Whole grains, such as whole wheat bread, brown rice, and quinoa. ? Lean meats, such as chicken or fish. ? Other healthy proteins, such as beans, eggs, or tofu. ? Healthy fats, such as nuts, seeds, fatty fish, and olive oil. ? Low-fat or fat-free dairy.  Check food labels and avoid food and drinks that: ? Are high in calories. ? Have added sugar. ? Are high in sodium. ? Have saturated fats or trans fats.  Limit how much you eat of the following  foods: ? Prepackaged meals. ? Fast food. ? Fried foods. ? Processed meat, such as bacon, sausage, and deli meats. ? Fatty cuts of red meat and poultry with skin.  Cook foods in healthier ways, such as by baking, broiling, or grilling.  When grocery shopping, try to shop around the outside of the store. This helps you buy mostly fresh foods and avoid canned and prepackaged foods.  What lifestyle changes can be made?  Exercise at least 30 minutes 5 or more days each week. Exercising includes brisk walking, yard work, biking, running, swimming, and team sports like basketball and soccer. Ask your health care provider which exercises are safe for you.  Do not use any products that contain nicotine or tobacco, such as cigarettes and e-cigarettes. If you need help quitting, ask your health care provider.  Limit alcohol intake to no more than 1 drink a day for nonpregnant women and 2 drinks a day for men. One drink equals 12 oz of beer, 5 oz of wine, or 1 oz of hard liquor.  Try to get 7-9 hours of sleep each night. What other changes can be made?  Keep a food and activity journal to keep track of: ? What you ate and how many calories you had. Remember to count sauces, dressings, and side dishes. ? Whether you were active, and what exercises you did. ? Your calorie, weight, and activity goals.  Check your weight regularly. Track any changes.   If you notice you have gained weight, make changes to your diet or activity routine.  Avoid taking weight-loss medicines or supplements. Talk to your health care provider before starting any new medicine or supplement.  Talk to your health care provider before trying any new diet or exercise plan. Why are these changes important? Eating healthy, staying active, and having healthy habits not only help prevent obesity, they also:  Help you to manage stress and emotions.  Help you to connect with friends and family.  Improve your  self-esteem.  Improve your sleep.  Prevent long-term health problems.  What can happen if changes are not made? Being obese or overweight can cause you to develop joint or bone problems, which can make it hard for you to stay active or do activities you enjoy. Being obese or overweight also puts stress on your heart and lungs and can lead to health problems like diabetes, heart disease, and some cancers. Where to find more information: Talk with your health care provider or a dietitian about healthy eating and healthy lifestyle choices. You may also find other information through these resources:  [place] Department of Agriculture MyPlate: www.choosemyplate.gov  American Heart Association: www.heart.org  Centers for Disease Control and Prevention: www.cdc.gov  Summary  Staying at a healthy weight is important. It helps prevent certain diseases and health problems, such as heart disease, diabetes, joint problems, sleep disorders, and some cancers.  Being obese or overweight can cause you to develop joint or bone problems, which can make it hard for you to stay active or do activities you enjoy.  You can prevent unhealthy weight gain by eating a healthy diet, exercising regularly, not smoking, limiting alcohol, and getting enough sleep.  Talk with your health care provider or a dietitian for guidance about healthy eating and healthy lifestyle choices. This information is not intended to replace advice given to you by your health care provider. Make sure you discuss any questions you have with your health care provider. Document Released: 08/22/2016 Document Revised: 09/27/2016 Document Reviewed: 09/27/2016 Elsevier Interactive Patient Education  2018 Elsevier Inc.  

## 2017-02-26 NOTE — Progress Notes (Signed)
Subjective:    Patient ID: Devon Rios, male    DOB: Jan 16, 1966, 51 y.o.   MRN: [id number]  HPI  Pt presents to the clinic today for his 4th monthly followup for Morbid Obesity. He is planning to have the gastric sleeve done. He has been evaluated by nutrition and psychology in preperation for the surgery. His weight today is 360 lbs with a BMI of 59.45. He has gained 4 lbs since his last visit. He is eating 5 small meals per day. He has a history fo DM 2, HTN and OSA.  He is also due to follow up DM 2. His last A1C was 7.8%, 09/2016. His sugars range 97-198. He is taking Januvia and Glipizide as prescribed. He denies hypoglycemia. He denies increased thirst, urinary frequency, or numbness or tingling in his hands and feet.  Review of Systems      Past Medical History:  Diagnosis Date  . Chicken pox   . DM2 (diabetes mellitus, type 2) ([place]) 07/21/2014  . Frequent headaches   . Hypertension   . OSA on CPAP 2013  . Severe obesity (BMI >= 40) ([facility]) 07/14/2014    Current Outpatient Prescriptions  Medication Sig Dispense Refill  . glipiZIDE (GLUCOTROL) 10 MG tablet Take 1 tablet (10 mg total) by mouth 2 (two) times daily before a meal. 180 tablet 0  . glipiZIDE (GLUCOTROL) 10 MG tablet TAKE 1 TABLET BY MOUTH TWICE DAILY BEFORE A  MEAL. 180 tablet 0  . glucose blood (ONE TOUCH TEST STRIPS) test strip 1 each by Other route 2 (two) times daily. Ultra Dx E11.9 100 each 6  . losartan-hydrochlorothiazide (HYZAAR) 100-25 MG tablet TAKE ONE TABLET BY MOUTH ONCE DAILY 90 tablet 1  . Multiple Vitamin (MULTIVITAMIN) tablet Take 1 tablet by mouth daily.    . Omega-3 Fatty Acids (FISH OIL) 1000 MG CAPS Take 1 capsule by mouth daily.    [name] DELICA LANCETS 85I MISC 1 each by Does not apply route 2 (two) times daily. Dx E11.9 100 each 6  . sitaGLIPtin (JANUVIA) 50 MG tablet Take 1 tablet (50 mg total) by mouth daily. 90 tablet 1   No current facility-administered medications for this visit.       No Known Allergies  Family History  Problem Relation Age of Onset  . Hypertension Mother   . Diabetes Mother   . Cancer Maternal Grandmother        Lung  . Stroke Neg Hx     Social History   Social History  . Marital status: Married    Spouse name: N/A  . Number of children: N/A  . Years of education: N/A   Occupational History  . Not on file.   Social History Main Topics  . Smoking status: Never Smoker  . Smokeless tobacco: Never Used  . Alcohol use 0.0 oz/week     Comment: never  . Drug use: No  . Sexual activity: Yes   Other Topics Concern  . Not on file   Social History Narrative  . No narrative on file     Constitutional: Denies fever, malaise, fatigue, headache or abrupt weight changes.  Respiratory: Denies difficulty breathing, shortness of breath, cough or sputum production.   Cardiovascular: Denies chest pain, chest tightness, palpitations or swelling in the hands or feet.  Skin: Denies redness, rashes, lesions or ulcercations.    No other specific complaints in a complete review of systems (except as listed in HPI above).  Objective:  Physical Exam  BP (!) 140/98   Pulse 78   Temp 98 F (36.7 C) (Oral)   Wt (!) 360 lb (163.3 kg)   SpO2 97%   BMI 59.45 kg/m  Wt Readings from Last 3 Encounters:  02/26/17 (!) 360 lb (163.3 kg)  01/23/17 (!) 356 lb (161.5 kg)  12/25/16 (!) 358 lb 12 oz (162.7 kg)    General: Appears his stated age, obese in NAD. Skin: Warm, dry and intact. No ulcerations noted. Cardiovascular: Normal rate and rhythm. S1,S2 noted.   Pulmonary/Chest: Normal effort and positive vesicular breath sounds. No respiratory distress. No wheezes, rales or ronchi noted.  Neurological: Alert and oriented. Sensation intact to BLE.  BMET    Component Value Date/Time   NA 140 07/03/2016 1526   K 4.3 07/03/2016 1526   CL 101 07/03/2016 1526   CO2 30 07/03/2016 1526   GLUCOSE 261 (H) 07/03/2016 1526   BUN 19 07/03/2016 1526    CREATININE 1.38 07/03/2016 1526   CALCIUM 10.2 07/03/2016 1526    Lipid Panel     Component Value Date/Time   CHOL 142 07/03/2016 1526   TRIG 133.0 07/03/2016 1526   HDL 40.00 07/03/2016 1526   CHOLHDL 4 07/03/2016 1526   VLDL 26.6 07/03/2016 1526   LDLCALC 76 07/03/2016 1526    CBC    Component Value Date/Time   WBC 6.8 07/03/2016 1526   RBC 5.36 07/03/2016 1526   HGB 15.0 07/03/2016 1526   HCT 45.7 07/03/2016 1526   PLT 179.0 07/03/2016 1526   MCV 85.3 07/03/2016 1526   MCHC 32.8 07/03/2016 1526   RDW 15.2 07/03/2016 1526    Hgb A1C Lab Results  Component Value Date   HGBA1C 7.8 (H) 09/28/2016            Assessment & Plan:   Morbid Obesity:  He has gained a few pounds He continues to work on his diet but is not exercising due to heat and knee pain Discussed trying some water aerobics Continue diet per nutritionist  DM 2:  Encouraged him to consume a low carb diet and exercise for weight loss A1C today Continue Glipizide and Januvia for now On ARB therapy, no microalbumin Foot exam today Encouraged yearly eye exams  RTC in 1 month for weight check

## 2017-03-04 HISTORY — PX: OTHER SURGICAL HISTORY: SHX169

## 2017-03-27 ENCOUNTER — Ambulatory Visit (INDEPENDENT_AMBULATORY_CARE_PROVIDER_SITE_OTHER): Payer: BLUE CROSS/BLUE SHIELD | Admitting: Internal Medicine

## 2017-03-27 ENCOUNTER — Encounter: Payer: Self-pay | Admitting: Internal Medicine

## 2017-03-27 NOTE — Assessment & Plan Note (Signed)
He will continue to eat small, frequent meals Discussed low carb options Encouraged him to continue to try to exercise- water aerobics would be a good idea given her knee pain

## 2017-03-27 NOTE — Progress Notes (Signed)
Subjective:    Patient ID: Devon Rios, male    DOB: 1966-01-08, 51 y.o.   MRN: [id number]  HPI  Pt presents to the clinic today for his 5th monthly followup for Morbid Obesity. He is planning to have the gastric sleeve. He has already been evaluated by nutrition and psychology. His weight today is 358.5 lbs with a BMI of 59.2. He has lost 1.5 lbs since his last visit. He is eating 5 small meals per day. He has been able to start walking again with the use of NSAID's and a knee brace. He has a history of DM 2, HTN and OSA.  Review of Systems  Past Medical History:  Diagnosis Date  . Chicken pox   . DM2 (diabetes mellitus, type 2) ([place]) 07/21/2014  . Frequent headaches   . Hypertension   . OSA on CPAP 2013  . Severe obesity (BMI >= 40) ([place]) 07/14/2014    Current Outpatient Prescriptions  Medication Sig Dispense Refill  . DUEXIS 800-26.6 MG TABS Take 1 tablet by mouth 3 (three) times daily.  2  . glipiZIDE (GLUCOTROL) 10 MG tablet Take 1 tablet (10 mg total) by mouth 2 (two) times daily before a meal. 180 tablet 0  . glucose blood (ONE TOUCH TEST STRIPS) test strip 1 each by Other route 2 (two) times daily. Ultra Dx E11.9 100 each 6  . losartan-hydrochlorothiazide (HYZAAR) 100-25 MG tablet TAKE ONE TABLET BY MOUTH ONCE DAILY 90 tablet 1  . Multiple Vitamin (MULTIVITAMIN) tablet Take 1 tablet by mouth daily.    . Omega-3 Fatty Acids (FISH OIL) 1000 MG CAPS Take 1 capsule by mouth daily.    [name] DELICA LANCETS 85I MISC 1 each by Does not apply route 2 (two) times daily. Dx E11.9 100 each 6  . sitaGLIPtin (JANUVIA) 50 MG tablet Take 1 tablet (50 mg total) by mouth daily. 90 tablet 1   No current facility-administered medications for this visit.     No Known Allergies  Family History  Problem Relation Age of Onset  . Hypertension Mother   . Diabetes Mother   . Cancer Maternal Grandmother        Lung  . Stroke Neg Hx     Social History   Social History  . Marital  status: Married    Spouse name: N/A  . Number of children: N/A  . Years of education: N/A   Occupational History  . Not on file.   Social History Main Topics  . Smoking status: Never Smoker  . Smokeless tobacco: Never Used  . Alcohol use 0.0 oz/week     Comment: never  . Drug use: No  . Sexual activity: Yes   Other Topics Concern  . Not on file   Social History Narrative  . No narrative on file     Constitutional: Denies fever, malaise, fatigue, headache or abrupt weight changes.  Respiratory: Denies difficulty breathing, shortness of breath, cough or sputum production.   Cardiovascular: Denies chest pain, chest tightness, palpitations or swelling in the hands or feet.  Musculoskeletal: Pt reports intermittent knee pain. Denies decrease in range of motion, difficulty with gait, muscle pain or joint swelling.    No other specific complaints in a complete review of systems (except as listed in HPI above).     Objective:   Physical Exam  BP 138/86   Pulse 71   Temp 98.1 F (36.7 C) (Oral)   Wt (!) 358 lb 8 oz (162.6  kg)   SpO2 98%   BMI 59.20 kg/m  Wt Readings from Last 3 Encounters:  03/27/17 (!) 358 lb 8 oz (162.6 kg)  02/26/17 (!) 360 lb (163.3 kg)  01/23/17 (!) 356 lb (161.5 kg)    General: Appears his stated age, obese in NAD. Cardiovascular: Normal rate and rhythm. Pulmonary/Chest: Normal effort and positive vesicular breath sounds. No respiratory distress. No wheezes, rales or ronchi noted.  Neurological: Alert and oriented.    BMET    Component Value Date/Time   NA 140 07/03/2016 1526   K 4.3 07/03/2016 1526   CL 101 07/03/2016 1526   CO2 30 07/03/2016 1526   GLUCOSE 261 (H) 07/03/2016 1526   BUN 19 07/03/2016 1526   CREATININE 1.38 07/03/2016 1526   CALCIUM 10.2 07/03/2016 1526    Lipid Panel     Component Value Date/Time   CHOL 142 07/03/2016 1526   TRIG 133.0 07/03/2016 1526   HDL 40.00 07/03/2016 1526   CHOLHDL 4 07/03/2016 1526    VLDL 26.6 07/03/2016 1526   LDLCALC 76 07/03/2016 1526    CBC    Component Value Date/Time   WBC 6.8 07/03/2016 1526   RBC 5.36 07/03/2016 1526   HGB 15.0 07/03/2016 1526   HCT 45.7 07/03/2016 1526   PLT 179.0 07/03/2016 1526   MCV 85.3 07/03/2016 1526   MCHC 32.8 07/03/2016 1526   RDW 15.2 07/03/2016 1526    Hgb A1C Lab Results  Component Value Date   HGBA1C 7.6 (H) 02/26/2017          Assessment & Plan:

## 2017-03-27 NOTE — Patient Instructions (Signed)
Exercising to Lose Weight Exercising can help you to lose weight. In order to lose weight through exercise, you need to do vigorous-intensity exercise. You can tell that you are exercising with vigorous intensity if you are breathing very hard and fast and cannot hold a conversation while exercising. Moderate-intensity exercise helps to maintain your current weight. You can tell that you are exercising at a moderate level if you have a higher heart rate and faster breathing, but you are still able to hold a conversation. How often should I exercise? Choose an activity that you enjoy and set realistic goals. Your health care provider can help you to make an activity plan that works for you. Exercise regularly as directed by your health care provider. This may include:  Doing resistance training twice each week, such as: ? Push-ups. ? Sit-ups. ? Lifting weights. ? Using resistance bands.  Doing a given intensity of exercise for a given amount of time. Choose from these options: ? 150 minutes of moderate-intensity exercise every week. ? 75 minutes of vigorous-intensity exercise every week. ? A mix of moderate-intensity and vigorous-intensity exercise every week.  Children, pregnant women, people who are out of shape, people who are overweight, and older adults may need to consult a health care provider for individual recommendations. If you have any sort of medical condition, be sure to consult your health care provider before starting a new exercise program. What are some activities that can help me to lose weight?  Walking at a rate of at least 4.5 miles an hour.  Jogging or running at a rate of 5 miles per hour.  Biking at a rate of at least 10 miles per hour.  Lap swimming.  Roller-skating or in-line skating.  Cross-country skiing.  Vigorous competitive sports, such as football, basketball, and soccer.  Jumping rope.  Aerobic dancing. How can I be more active in my day-to-day  activities?  Use the stairs instead of the elevator.  Take a walk during your lunch break.  If you drive, park your car farther away from work or school.  If you take public transportation, get off one stop early and walk the rest of the way.  Make all of your phone calls while standing up and walking around.  Get up, stretch, and walk around every 30 minutes throughout the day. What guidelines should I follow while exercising?  Do not exercise so much that you hurt yourself, feel dizzy, or get very short of breath.  Consult your health care provider prior to starting a new exercise program.  Wear comfortable clothes and shoes with good support.  Drink plenty of water while you exercise to prevent dehydration or heat stroke. Body water is lost during exercise and must be replaced.  Work out until you breathe faster and your heart beats faster. This information is not intended to replace advice given to you by your health care provider. Make sure you discuss any questions you have with your health care provider. Document Released: 09/23/2010 Document Revised: 01/27/2016 Document Reviewed: 01/22/2014 Elsevier Interactive Patient Education  2018 [employer].  

## 2017-04-17 ENCOUNTER — Encounter: Payer: Self-pay | Admitting: Internal Medicine

## 2017-04-17 ENCOUNTER — Ambulatory Visit (INDEPENDENT_AMBULATORY_CARE_PROVIDER_SITE_OTHER): Payer: BLUE CROSS/BLUE SHIELD | Admitting: Internal Medicine

## 2017-04-17 NOTE — Progress Notes (Signed)
Subjective:    Patient ID: Devon Rios, male    DOB: 11/12/1965, 51 y.o.   MRN: [id number]  HPI  Pt presents to the clinic today for his last monthly folow up for Morbid Obesity. He is moving forward with the gastric sleeve, although the procedure has not been scheduled yet. He has been evaluated by nutrition and psychology. His weight today is 358 lbs with a BMI of 59.12. He has lost 0.5 lbs since his last visit. He is eating 5 small meals per day. He is still walking for about 2 hours 2 days per week. He has a history of DM  2, HTN and OSA.  Review of Systems  Past Medical History:  Diagnosis Date  . Chicken pox   . DM2 (diabetes mellitus, type 2) ([place]) 07/21/2014  . Frequent headaches   . Hypertension   . OSA on CPAP 2013  . Severe obesity (BMI >= 40) ([facility]) 07/14/2014    Current Outpatient Prescriptions  Medication Sig Dispense Refill  . glipiZIDE (GLUCOTROL) 10 MG tablet Take 1 tablet (10 mg total) by mouth 2 (two) times daily before a meal. 180 tablet 0  . glucose blood (ONE TOUCH TEST STRIPS) test strip 1 each by Other route 2 (two) times daily. Ultra Dx E11.9 100 each 6  . losartan-hydrochlorothiazide (HYZAAR) 100-25 MG tablet TAKE ONE TABLET BY MOUTH ONCE DAILY 90 tablet 1  . Multiple Vitamin (MULTIVITAMIN) tablet Take 1 tablet by mouth daily.    . naproxen sodium (ANAPROX) 220 MG tablet Take 440 mg by mouth 2 (two) times daily with a meal.    . Omega-3 Fatty Acids (FISH OIL) 1000 MG CAPS Take 1 capsule by mouth daily.    [name] DELICA LANCETS 11H MISC 1 each by Does not apply route 2 (two) times daily. Dx E11.9 100 each 6  . sitaGLIPtin (JANUVIA) 50 MG tablet Take 1 tablet (50 mg total) by mouth daily. 90 tablet 1   No current facility-administered medications for this visit.     No Known Allergies  Family History  Problem Relation Age of Onset  . Hypertension Mother   . Diabetes Mother   . Cancer Maternal Grandmother        Lung  . Stroke Neg Hx      Social History   Social History  . Marital status: Married    Spouse name: N/A  . Number of children: N/A  . Years of education: N/A   Occupational History  . Not on file.   Social History Main Topics  . Smoking status: Never Smoker  . Smokeless tobacco: Never Used  . Alcohol use 0.0 oz/week     Comment: never  . Drug use: No  . Sexual activity: Yes   Other Topics Concern  . Not on file   Social History Narrative  . No narrative on file     Constitutional: Denies fever, malaise, fatigue, headache or abrupt weight changes.  Respiratory: Denies difficulty breathing, shortness of breath, cough or sputum production.   Cardiovascular: Denies chest pain, chest tightness, palpitations or swelling in the hands or feet.  Neurological: Denies dizziness, difficulty with memory, difficulty with speech or problems with balance and coordination.  Psych: Denies anxiety, depression, SI/HI.  No other specific complaints in a complete review of systems (except as listed in HPI above).     Objective:   Physical Exam   BP 138/86   Pulse 87   Temp 98.4 F (36.9 C) (Oral)  Wt (!) 358 lb (162.4 kg)   SpO2 98%   BMI 59.12 kg/m  Wt Readings from Last 3 Encounters:  04/17/17 (!) 358 lb (162.4 kg)  03/27/17 (!) 358 lb 8 oz (162.6 kg)  02/26/17 (!) 360 lb (163.3 kg)    General: Appears his stated age, obese in NAD. Cardiovascular: Normal rate and rhythm.  Pulmonary/Chest: Normal effort and positive vesicular breath sounds. No respiratory distress. No wheezes, rales or ronchi noted.  Neurological: Alert and oriented. Psychiatric: Mood and affect normal. Behavior is normal. Judgment and thought content normal.     BMET    Component Value Date/Time   NA 140 07/03/2016 1526   K 4.3 07/03/2016 1526   CL 101 07/03/2016 1526   CO2 30 07/03/2016 1526   GLUCOSE 261 (H) 07/03/2016 1526   BUN 19 07/03/2016 1526   CREATININE 1.38 07/03/2016 1526   CALCIUM 10.2 07/03/2016 1526     Lipid Panel     Component Value Date/Time   CHOL 142 07/03/2016 1526   TRIG 133.0 07/03/2016 1526   HDL 40.00 07/03/2016 1526   CHOLHDL 4 07/03/2016 1526   VLDL 26.6 07/03/2016 1526   LDLCALC 76 07/03/2016 1526    CBC    Component Value Date/Time   WBC 6.8 07/03/2016 1526   RBC 5.36 07/03/2016 1526   HGB 15.0 07/03/2016 1526   HCT 45.7 07/03/2016 1526   PLT 179.0 07/03/2016 1526   MCV 85.3 07/03/2016 1526   MCHC 32.8 07/03/2016 1526   RDW 15.2 07/03/2016 1526    Hgb A1C Lab Results  Component Value Date   HGBA1C 7.6 (H) 02/26/2017           Assessment & Plan:

## 2017-04-17 NOTE — Patient Instructions (Signed)
Bariatric Surgery Information Bariatric surgery, also called weight loss surgery, is a procedure that helps you lose weight. You may consider or your health care provider may suggest bariatric surgery if:  You are severely obese and have been unable to lose weight through diet and exercise.  You have health problems related to obesity, such as: ? Type 2 diabetes. ? Heart disease. ? Lung disease.  How does bariatric surgery help me lose weight? Bariatric surgery helps you lose weight by decreasing how much food your body absorbs. This is done by closing off part of your stomach to make it smaller. This restricts the amount of food your stomach can hold. Bariatric surgery can also change your body's regular digestive process, so that food bypasses the parts of your body that absorb calories and nutrients. If you decide to have bariatric surgery, it is important to continue to eat a healthy diet and exercise regularly after the surgery. What are the different kinds of bariatric surgery? There are two kinds of bariatric surgeries:  Restrictive surgeries make your stomach smaller. They do not change your digestive process. The smaller the size of your new stomach, the less food you can eat. There are different types of restrictive surgeries.  Malabsorptive surgeries both make your stomach smaller and alter your digestive process so that your body processes less calories and nutrients. These are the most common kind of bariatric surgery. There are different types of malabsorptive surgeries.  What are the different types of restrictive surgery? Adjustable Gastric Banding In this procedure, an inflatable band is placed around your stomach near the upper end. This makes the passageway for food into the rest of your stomach much smaller. The band can be adjusted, making it tighter or looser, by filling it with salt solution. Your surgeon can adjust the band based on how are you feeling and how much  weight you are losing. The band can be removed in the future. Vertical Banded Gastroplasty In this procedure, staples are used to separate your stomach into two parts, a small upper pouch and a bigger lower pouch. This decreases how much food you can eat. Sleeve Gastrectomy In this procedure, your stomach is made smaller. This is done by surgically removing a large part of your stomach. When your stomach is smaller, you feel full more quickly and reduce how much you eat. What are the different types of malabsorptive surgery? Roux-en-Y Gastric Bypass (RGB) This is the most common weight loss surgery. In this procedure, a small stomach pouch is created in the upper part of your stomach. Next, this small stomach pouch is attached directly to the middle part of your small intestine. The farther down your small intestine the new connection is made, the fewer calories and nutrients you will absorb. Biliopancreatic Diversion with Duodenal Switch (BPD/DS) This is a multi-step procedure. In this procedure, a large part of your stomach is removed, making your stomach smaller. Next, this smaller stomach is attached to the lower part of your small intestine. Like the RGB surgery, you absorb fewer calories and nutrients the farther down your small intestine the attachment is made. What are the risks of bariatric surgery? As with any surgical procedure, each type of bariatric surgery has its own risks. These risks also depend on your age, your overall health, and any other medical conditions you may have. When deciding on bariatric surgery, it is very important to:  Talk to your health care provider and choose the surgery that is best for   you.  Ask your health care provider about specific risks for the surgery you choose.  Where to find more information:  American Society for Metabolic & Bariatric Surgery: www.asmbs.org  Weight-control Information Network (WIN): win.niddk.nih.gov This information is not  intended to replace advice given to you by your health care provider. Make sure you discuss any questions you have with your health care provider. Document Released: 08/21/2005 Document Revised: 01/27/2016 Document Reviewed: 02/19/2013 Elsevier Interactive Patient Education  2017 Elsevier Inc.  

## 2017-04-17 NOTE — Assessment & Plan Note (Signed)
He will proceed with the gastric sleeve at this time I plan to see him after his surgery, to check in on him, adjust any medications at that time if needed

## 2017-05-14 ENCOUNTER — Encounter: Payer: BLUE CROSS/BLUE SHIELD | Attending: General Surgery | Admitting: Skilled Nursing Facility1

## 2017-05-14 DIAGNOSIS — E1165 Type 2 diabetes mellitus with hyperglycemia: Secondary | ICD-10-CM

## 2017-05-14 DIAGNOSIS — Z713 Dietary counseling and surveillance: Secondary | ICD-10-CM | POA: Insufficient documentation

## 2017-05-15 ENCOUNTER — Encounter: Payer: Self-pay | Admitting: Skilled Nursing Facility1

## 2017-05-15 NOTE — Progress Notes (Signed)
Pre-Operative Nutrition Class:  Appt start time: 5025   End time:  1830.  Patient was seen on 05/14/2017 for Pre-Operative Bariatric Surgery Education [facility].   Surgery date:  Surgery type: Sleeve Start weight at [facility]: 357 Weight today: 358.4  Samples given per MNT protocol. Patient educated on appropriate usage: Bariatric Advantage Multivitamin Lot # [id number] Exp:06/19  Bariatric Advantage Calcium  Lot # [id number] Exp: sep-26-2018  Unjury Protein  Shake Lot # [id number] Exp: Jan 28, 2018  The following the learning objectives were met by the patient during this course:  Identify Pre-Op Dietary Goals and will begin 2 weeks pre-operatively  Identify appropriate sources of fluids and proteins   State protein recommendations and appropriate sources pre and post-operatively  Identify Post-Operative Dietary Goals and will follow for 2 weeks post-operatively  Identify appropriate multivitamin and calcium sources  Describe the need for physical activity post-operatively and will follow MD recommendations  State when to call healthcare provider regarding medication questions or post-operative complications  Handouts given during class include:  Pre-Op Bariatric Surgery Diet Handout  Protein Shake Handout  Post-Op Bariatric Surgery Nutrition Handout  [facility] Program Information Flyer  Support Group Information Flyer  [facility] Bariatric Supplements Price List  Follow-Up Plan: [facility] for diet advancement per MD.

## 2017-06-04 ENCOUNTER — Telehealth: Payer: Self-pay | Admitting: Internal Medicine

## 2017-06-04 NOTE — Telephone Encounter (Signed)
PT wife dropped off wellness form to be completed for discount on ins premiums. Last cpe was 08/22/16. I placed in Rx tower. Please fax when completed to [phone] and call pt for pick up.

## 2017-06-05 NOTE — Telephone Encounter (Signed)
Signed and placed in MYD box

## 2017-06-05 NOTE — Telephone Encounter (Signed)
Placed in your box for signature  

## 2017-06-05 NOTE — Telephone Encounter (Signed)
I called pt with no answer, Left detailed msg on VM per HIPAA Called wife ok per HIPAA and let her know that the instructions on the form stated the CPE needed to be completed in 2018 but the form is due no later than Dec 7th.... Pt's last CPE was 08/22/2016... If she needs to schedule appt schedule for 08/23/17... She stated she will call them tomorrow to verify and let me know

## 2017-06-07 NOTE — Progress Notes (Signed)
11/08/2016- noted in EPIC- EKG and CXR 06-28/201/- noted in EPIC_ last HgA1C- 7.6

## 2017-06-07 NOTE — Patient Instructions (Addendum)
Devon Rios  06/07/2017   Your procedure is scheduled on: Monday 06-11-17  Report to [place]  elevators to 3rd floor to  [facility] at 1235 pm BRING CPAP MASK AND TUBING    Call this number if you have problems the morning of surgery [phone]    Remember: ONLY 1 PERSON MAY GO WITH YOU TO SHORT STAY TO GET  READY MORNING OF YOUR SURGERY.  Do not eat food  :After Midnight.clear liquids midnight until 830 am then nothing by mouth after 830 am.   How to Manage Your Diabetes Before and After Surgery  Why is it important to control my blood sugar before and after surgery? . Improving blood sugar levels before and after surgery helps healing and can limit problems. . A way of improving blood sugar control is eating a healthy diet by: o  Eating less sugar and carbohydrates o  Increasing activity/exercise o  Talking with your doctor about reaching your blood sugar goals . High blood sugars (greater than 180 mg/dL) can raise your risk of infections and slow your recovery, so you will need to focus on controlling your diabetes during the weeks before surgery. . Make sure that the doctor who takes care of your diabetes knows about your planned surgery including the date and location.  How do I manage my blood sugar before surgery? . Check your blood sugar at least 4 times a day, starting 2 days before surgery, to make sure that the level is not too high or low. o Check your blood sugar the morning of your surgery when you wake up and every 2 hours until you get to the Short Stay unit. . If your blood sugar is less than 70 mg/dL, you will need to treat for low blood sugar: o Do not take insulin. o Treat a low blood sugar (less than 70 mg/dL) with  cup of clear juice (cranberry or apple), 4 glucose tablets, OR glucose gel. o Recheck blood sugar in 15 minutes after treatment (to make sure it is greater than 70 mg/dL). If your blood sugar  is not greater than 70 mg/dL on recheck, call [phone] for further instructions. . Report your blood sugar to the short stay nurse when you get to Short Stay.  . If you are admitted to the hospital after surgery: o Your blood sugar will be checked by the staff and you will probably be given insulin after surgery (instead of oral diabetes medicines) to make sure you have good blood sugar levels. o The goal for blood sugar control after surgery is 80-180 mg/dL.   WHAT DO I DO ABOUT MY DIABETES MEDICATION?  Marland Kitchen Do not take oral diabetes medicines (pills) the morning of surgery. DAY BEFORE SURGERY 06-10-17 YOU MAY TAKE YOUR MORNING DOSE OF GLIPIZIDE ONLY. DAY BEFORE SURGERY 06-10-17 YOU MAY TAKE YOUR LUNCH TIME DOSE OF JANUVIA.     Take these medicines the morning of surgery with A SIP OF WATER: none  DO NOT TAKE ANY DIABETIC MEDICATIONS DAY OF YOUR SURGERY                               You may not have any metal on your body including hair pins and              piercings  Do not wear jewelry, make-up, lotions, powders or perfumes, deodorant             Do not wear nail polish.  Do not shave  48 hours prior to surgery.              Men may shave face and neck.   Do not bring valuables to the hospital. [place].  Contacts, dentures or bridgework may not be worn into surgery.  Leave suitcase in the car. After surgery it may be brought to your room.                  Please read over the following fact sheets you were given: _____________________________________________________________________             [facility] - Preparing for Surgery Before surgery, you can play an important role.  Because skin is not sterile, your skin needs to be as free of germs as possible.  You can reduce the number of germs on your skin by washing with CHG (chlorahexidine gluconate) soap before surgery.  CHG is an antiseptic cleaner which kills germs and  bonds with the skin to continue killing germs even after washing. Please DO NOT use if you have an allergy to CHG or antibacterial soaps.  If your skin becomes reddened/irritated stop using the CHG and inform your nurse when you arrive at Short Stay. Do not shave (including legs and underarms) for at least 48 hours prior to the first CHG shower.  You may shave your face/neck. Please follow these instructions carefully:  1.  Shower with CHG Soap the night before surgery and the  morning of Surgery.  2.  If you choose to wash your hair, wash your hair first as usual with your  normal  shampoo.  3.  After you shampoo, rinse your hair and body thoroughly to remove the  shampoo.                           4.  Use CHG as you would any other liquid soap.  You can apply chg directly  to the skin and wash                       Gently with a scrungie or clean washcloth.  5.  Apply the CHG Soap to your body ONLY FROM THE NECK DOWN.   Do not use on face/ open                           Wound or open sores. Avoid contact with eyes, ears mouth and genitals (private parts).                       Wash face,  Genitals (private parts) with your normal soap.             6.  Wash thoroughly, paying special attention to the area where your surgery  will be performed.  7.  Thoroughly rinse your body with warm water from the neck down.  8.  DO NOT shower/wash with your normal soap after using and rinsing off  the CHG Soap.                9.  Pat yourself dry with  a clean towel.            10.  Wear clean pajamas.            11.  Place clean sheets on your bed the night of your first shower and do not  sleep with pets. Day of Surgery : Do not apply any lotions/deodorants the morning of surgery.  Please wear clean clothes to the hospital/surgery center.  FAILURE TO FOLLOW THESE INSTRUCTIONS MAY RESULT IN THE CANCELLATION OF YOUR SURGERY PATIENT SIGNATURE_________________________________  NURSE  SIGNATURE__________________________________  ________________________________________________________________________

## 2017-06-07 NOTE — Progress Notes (Signed)
Please place orders in EPIC as patient is being scheduled for a pre-op appointment! Thank you! 

## 2017-06-08 ENCOUNTER — Encounter (HOSPITAL_COMMUNITY): Payer: Self-pay

## 2017-06-08 ENCOUNTER — Encounter (HOSPITAL_COMMUNITY)
Admission: RE | Admit: 2017-06-08 | Discharge: 2017-06-08 | Disposition: A | Discharge status: Home / Self Care | Payer: BLUE CROSS/BLUE SHIELD | Source: Ambulatory Visit | Attending: Surgery | Admitting: Surgery

## 2017-06-08 HISTORY — DX: Unspecified osteoarthritis, unspecified site: M19.90

## 2017-06-08 LAB — BASIC METABOLIC PANEL
Anion gap: 8 (ref 5–15)
BUN: 22 mg/dL — AB (ref 6–20)
CALCIUM: 9.8 mg/dL (ref 8.9–10.3)
CO2: 27 mmol/L (ref 22–32)
CREATININE: 1.5 mg/dL — AB (ref 0.61–1.24)
Chloride: 104 mmol/L (ref 101–111)
GFR calc non Af Amer: 53 mL/min — ABNORMAL LOW (ref >60)
GLUCOSE: 113 mg/dL — AB (ref 65–99)
Potassium: 4.4 mmol/L (ref 3.5–5.1)
Sodium: 139 mmol/L (ref 135–145)

## 2017-06-08 LAB — CBC
HCT: 42.9 % (ref 39.0–52.0)
Hemoglobin: 14.3 g/dL (ref 13.0–17.0)
MCH: 28 pg (ref 26.0–34.0)
MCHC: 33.3 g/dL (ref 30.0–36.0)
MCV: 84.1 fL (ref 78.0–100.0)
PLATELETS: 178 10*3/uL (ref 150–400)
RBC: 5.1 MIL/uL (ref 4.22–5.81)
RDW: 14.8 % (ref 11.5–15.5)
WBC: 5.9 10*3/uL (ref 4.0–10.5)

## 2017-06-08 LAB — GLUCOSE, CAPILLARY: GLUCOSE-CAPILLARY: 111 mg/dL — AB (ref 65–99)

## 2017-06-08 LAB — HEMOGLOBIN A1C
HEMOGLOBIN A1C: 7.3 % — AB (ref 4.8–5.6)
Mean Plasma Glucose: 162.81 mg/dL

## 2017-06-08 NOTE — Progress Notes (Signed)
barimax bed with trapeze ordered from [place] [facility].

## 2017-06-08 NOTE — Telephone Encounter (Signed)
I have the form in my box until I hear of anything further

## 2017-06-08 NOTE — Progress Notes (Signed)
bmet results routed to dr Hassell Done epic inbasket

## 2017-06-11 ENCOUNTER — Inpatient Hospital Stay (HOSPITAL_COMMUNITY): Payer: BLUE CROSS/BLUE SHIELD | Admitting: Anesthesiology

## 2017-06-11 ENCOUNTER — Encounter (HOSPITAL_COMMUNITY): Payer: Self-pay | Admitting: *Deleted

## 2017-06-11 ENCOUNTER — Ambulatory Visit: Payer: Self-pay | Admitting: Surgery

## 2017-06-11 ENCOUNTER — Inpatient Hospital Stay (HOSPITAL_COMMUNITY)
Admission: RE | Admit: 2017-06-11 | Discharge: 2017-06-12 | DRG: 621 | Disposition: A | Discharge status: Home / Self Care | Payer: BLUE CROSS/BLUE SHIELD | Source: Ambulatory Visit | Attending: Surgery | Admitting: Surgery

## 2017-06-11 ENCOUNTER — Encounter (HOSPITAL_COMMUNITY)
Admission: RE | Disposition: A | Discharge status: Home / Self Care | Payer: Self-pay | Source: Ambulatory Visit | Attending: Surgery

## 2017-06-11 DIAGNOSIS — E119 Type 2 diabetes mellitus without complications: Secondary | ICD-10-CM | POA: Diagnosis present

## 2017-06-11 DIAGNOSIS — Z9889 Other specified postprocedural states: Secondary | ICD-10-CM | POA: Diagnosis not present

## 2017-06-11 DIAGNOSIS — Z6841 Body Mass Index (BMI) 40.0 and over, adult: Secondary | ICD-10-CM

## 2017-06-11 DIAGNOSIS — G4733 Obstructive sleep apnea (adult) (pediatric): Secondary | ICD-10-CM | POA: Diagnosis present

## 2017-06-11 DIAGNOSIS — I10 Essential (primary) hypertension: Secondary | ICD-10-CM | POA: Diagnosis present

## 2017-06-11 DIAGNOSIS — Z7984 Long term (current) use of oral hypoglycemic drugs: Secondary | ICD-10-CM

## 2017-06-11 HISTORY — PX: LAPAROSCOPIC GASTRIC SLEEVE RESECTION: SHX5895

## 2017-06-11 LAB — GLUCOSE, CAPILLARY
GLUCOSE-CAPILLARY: 165 mg/dL — AB (ref 65–99)
GLUCOSE-CAPILLARY: 167 mg/dL — AB (ref 65–99)
Glucose-Capillary: 108 mg/dL — ABNORMAL HIGH (ref 65–99)

## 2017-06-11 LAB — CBC
HEMATOCRIT: 42.7 % (ref 39.0–52.0)
HEMOGLOBIN: 13.9 g/dL (ref 13.0–17.0)
MCH: 27.9 pg (ref 26.0–34.0)
MCHC: 32.6 g/dL (ref 30.0–36.0)
MCV: 85.6 fL (ref 78.0–100.0)
Platelets: 155 10*3/uL (ref 150–400)
RBC: 4.99 MIL/uL (ref 4.22–5.81)
RDW: 15 % (ref 11.5–15.5)
WBC: 11.1 10*3/uL — ABNORMAL HIGH (ref 4.0–10.5)

## 2017-06-11 LAB — CREATININE, SERUM
Creatinine, Ser: 1.6 mg/dL — ABNORMAL HIGH (ref 0.61–1.24)
GFR, EST AFRICAN AMERICAN: 56 mL/min — AB (ref >60)
GFR, EST NON AFRICAN AMERICAN: 49 mL/min — AB (ref >60)

## 2017-06-11 SURGERY — GASTRECTOMY, SLEEVE, LAPAROSCOPIC
Anesthesia: General | Site: Abdomen

## 2017-06-11 MED ORDER — ACETAMINOPHEN 500 MG PO TABS: 1000.0000 mg | ORAL_TABLET | ORAL | Status: DC

## 2017-06-11 MED ORDER — SUCCINYLCHOLINE CHLORIDE 200 MG/10ML IV SOSY
PREFILLED_SYRINGE | INTRAVENOUS | Status: DC | PRN
  Administered 2017-06-11: 160 mg via INTRAVENOUS

## 2017-06-11 MED ORDER — OXYCODONE HCL 5 MG PO TABS: 5.0000 mg | ORAL_TABLET | Freq: Once | ORAL | Status: DC

## 2017-06-11 MED ORDER — HYDROMORPHONE HCL-NACL 0.5-0.9 MG/ML-% IV SOSY
PREFILLED_SYRINGE | INTRAVENOUS | Status: AC
  Administered 2017-06-11: 0.5 mg
  Filled 2017-06-11: qty 2

## 2017-06-11 MED ORDER — SUCCINYLCHOLINE CHLORIDE 200 MG/10ML IV SOSY
PREFILLED_SYRINGE | INTRAVENOUS | Status: AC
  Filled 2017-06-11: qty 10

## 2017-06-11 MED ORDER — ACETAMINOPHEN 500 MG PO TABS
1000.0000 mg | ORAL_TABLET | ORAL | Status: AC
  Administered 2017-06-11: 1000 mg via ORAL
  Filled 2017-06-11: qty 2

## 2017-06-11 MED ORDER — ONDANSETRON HCL 4 MG/2ML IJ SOLN
INTRAMUSCULAR | Status: AC
  Filled 2017-06-11: qty 2

## 2017-06-11 MED ORDER — HEPARIN SODIUM (PORCINE) 5000 UNIT/ML IJ SOLN
5000.0000 [IU] | INTRAMUSCULAR | Status: AC
  Administered 2017-06-11: 5000 [IU] via SUBCUTANEOUS
  Filled 2017-06-11: qty 1

## 2017-06-11 MED ORDER — ACETAMINOPHEN 160 MG/5ML PO SOLN
650.0000 mg | ORAL | Status: DC | PRN
  Filled 2017-06-11: qty 20.3

## 2017-06-11 MED ORDER — MORPHINE SULFATE (PF) 2 MG/ML IV SOLN
1.0000 mg | INTRAVENOUS | Status: DC | PRN
  Administered 2017-06-11 (×2): 2 mg via INTRAVENOUS
  Filled 2017-06-11 (×2): qty 1

## 2017-06-11 MED ORDER — OXYCODONE HCL 5 MG/5ML PO SOLN
5.0000 mg | ORAL | Status: DC | PRN
  Administered 2017-06-11 – 2017-06-12 (×3): 10 mg via ORAL
  Administered 2017-06-12: 5 mg via ORAL
  Filled 2017-06-11 (×2): qty 10
  Filled 2017-06-11: qty 5
  Filled 2017-06-11: qty 10

## 2017-06-11 MED ORDER — ROCURONIUM BROMIDE 50 MG/5ML IV SOSY
PREFILLED_SYRINGE | INTRAVENOUS | Status: AC
  Filled 2017-06-11: qty 5

## 2017-06-11 MED ORDER — PHENYLEPHRINE 40 MCG/ML (10ML) SYRINGE FOR IV PUSH (FOR BLOOD PRESSURE SUPPORT)
PREFILLED_SYRINGE | INTRAVENOUS | Status: AC
  Filled 2017-06-11: qty 10

## 2017-06-11 MED ORDER — ONDANSETRON HCL 4 MG/2ML IJ SOLN: 4.0000 mg | INTRAMUSCULAR | Status: DC | PRN

## 2017-06-11 MED ORDER — OXYCODONE HCL 5 MG/5ML PO SOLN: 5.0000 mg | Freq: Once | ORAL | Status: DC

## 2017-06-11 MED ORDER — CEFOTETAN DISODIUM-DEXTROSE 2-2.08 GM-% IV SOLR: 2.0000 g | INTRAVENOUS | Status: DC

## 2017-06-11 MED ORDER — DEXAMETHASONE SODIUM PHOSPHATE 4 MG/ML IJ SOLN: 4.0000 mg | INTRAMUSCULAR | Status: DC

## 2017-06-11 MED ORDER — LACTATED RINGERS IV SOLN
INTRAVENOUS | Status: DC
  Administered 2017-06-11 (×3): via INTRAVENOUS

## 2017-06-11 MED ORDER — KETAMINE HCL 10 MG/ML IJ SOLN
INTRAMUSCULAR | Status: DC | PRN
  Administered 2017-06-11: 30 mg via INTRAVENOUS

## 2017-06-11 MED ORDER — SCOPOLAMINE 1 MG/3DAYS TD PT72
1.0000 | MEDICATED_PATCH | TRANSDERMAL | Status: DC
  Administered 2017-06-11: 1.5 mg via TRANSDERMAL
  Filled 2017-06-11: qty 1

## 2017-06-11 MED ORDER — FENTANYL CITRATE (PF) 100 MCG/2ML IJ SOLN
INTRAMUSCULAR | Status: DC | PRN
  Administered 2017-06-11: 100 ug via INTRAVENOUS

## 2017-06-11 MED ORDER — CELECOXIB 200 MG PO CAPS
400.0000 mg | ORAL_CAPSULE | ORAL | Status: AC
  Administered 2017-06-11: 400 mg via ORAL
  Filled 2017-06-11: qty 2

## 2017-06-11 MED ORDER — PHENYLEPHRINE HCL 10 MG/ML IJ SOLN
INTRAMUSCULAR | Status: DC | PRN
  Administered 2017-06-11: 50 ug/min via INTRAVENOUS

## 2017-06-11 MED ORDER — HYDROMORPHONE HCL-NACL 0.5-0.9 MG/ML-% IV SOSY: 0.2500 mg | PREFILLED_SYRINGE | INTRAVENOUS | Status: DC | PRN

## 2017-06-11 MED ORDER — ROCURONIUM BROMIDE 10 MG/ML (PF) SYRINGE
PREFILLED_SYRINGE | INTRAVENOUS | Status: DC | PRN
Start: 2017-06-11 — End: 2017-06-11
  Administered 2017-06-11: 20 mg via INTRAVENOUS
  Administered 2017-06-11: 50 mg via INTRAVENOUS

## 2017-06-11 MED ORDER — KCL IN DEXTROSE-NACL 20-5-0.45 MEQ/L-%-% IV SOLN
INTRAVENOUS | Status: DC
  Administered 2017-06-11 – 2017-06-12 (×2): via INTRAVENOUS
  Filled 2017-06-11 (×3): qty 1000

## 2017-06-11 MED ORDER — HEPARIN SODIUM (PORCINE) 5000 UNIT/ML IJ SOLN
5000.0000 [IU] | Freq: Three times a day (TID) | INTRAMUSCULAR | Status: DC
  Administered 2017-06-11 – 2017-06-12 (×3): 5000 [IU] via SUBCUTANEOUS
  Filled 2017-06-11 (×3): qty 1

## 2017-06-11 MED ORDER — SUGAMMADEX SODIUM 500 MG/5ML IV SOLN
INTRAVENOUS | Status: AC
  Filled 2017-06-11: qty 5

## 2017-06-11 MED ORDER — SODIUM CHLORIDE 0.9 % IJ SOLN
INTRAMUSCULAR | Status: AC
  Filled 2017-06-11: qty 10

## 2017-06-11 MED ORDER — LACTATED RINGERS IR SOLN
Status: DC | PRN
  Administered 2017-06-11: 1000 mL

## 2017-06-11 MED ORDER — CELECOXIB 200 MG PO CAPS: 400.0000 mg | ORAL_CAPSULE | ORAL | Status: DC

## 2017-06-11 MED ORDER — SODIUM CHLORIDE 0.9 % IJ SOLN: INTRAMUSCULAR | Status: DC | PRN

## 2017-06-11 MED ORDER — PREMIER PROTEIN SHAKE
2.0000 [oz_av] | ORAL | Status: DC
  Administered 2017-06-12 (×3): 2 [oz_av] via ORAL

## 2017-06-11 MED ORDER — DEXAMETHASONE SODIUM PHOSPHATE 10 MG/ML IJ SOLN
INTRAMUSCULAR | Status: DC | PRN
  Administered 2017-06-11: 10 mg via INTRAVENOUS

## 2017-06-11 MED ORDER — LIDOCAINE 2% (20 MG/ML) 5 ML SYRINGE
INTRAMUSCULAR | Status: AC
  Filled 2017-06-11: qty 5

## 2017-06-11 MED ORDER — KETAMINE HCL-SODIUM CHLORIDE 100-0.9 MG/10ML-% IV SOSY
PREFILLED_SYRINGE | INTRAVENOUS | Status: AC
  Filled 2017-06-11: qty 10

## 2017-06-11 MED ORDER — BUPIVACAINE LIPOSOME 1.3 % IJ SUSP
20.0000 mL | Freq: Once | INTRAMUSCULAR | Status: AC
  Administered 2017-06-11: 20 mL
  Filled 2017-06-11: qty 20

## 2017-06-11 MED ORDER — ONDANSETRON HCL 4 MG/2ML IJ SOLN: 4.0000 mg | Freq: Four times a day (QID) | INTRAMUSCULAR | Status: DC | PRN

## 2017-06-11 MED ORDER — HYDROMORPHONE HCL-NACL 0.5-0.9 MG/ML-% IV SOSY
0.2500 mg | PREFILLED_SYRINGE | INTRAVENOUS | Status: DC | PRN
  Administered 2017-06-11 (×4): 0.5 mg via INTRAVENOUS

## 2017-06-11 MED ORDER — PROPOFOL 10 MG/ML IV BOLUS
INTRAVENOUS | Status: AC
  Filled 2017-06-11: qty 20

## 2017-06-11 MED ORDER — CHLORHEXIDINE GLUCONATE CLOTH 2 % EX PADS: 6.0000 | MEDICATED_PAD | Freq: Once | CUTANEOUS | Status: DC

## 2017-06-11 MED ORDER — ONDANSETRON HCL 4 MG/2ML IJ SOLN
INTRAMUSCULAR | Status: DC | PRN
  Administered 2017-06-11: 4 mg via INTRAVENOUS

## 2017-06-11 MED ORDER — METOPROLOL TARTRATE 5 MG/5ML IV SOLN: 5.0000 mg | Freq: Four times a day (QID) | INTRAVENOUS | Status: DC | PRN

## 2017-06-11 MED ORDER — GABAPENTIN 300 MG PO CAPS
300.0000 mg | ORAL_CAPSULE | ORAL | Status: AC
  Administered 2017-06-11: 300 mg via ORAL
  Filled 2017-06-11: qty 1

## 2017-06-11 MED ORDER — APREPITANT 40 MG PO CAPS
40.0000 mg | ORAL_CAPSULE | ORAL | Status: AC
  Administered 2017-06-11: 40 mg via ORAL
  Filled 2017-06-11: qty 1

## 2017-06-11 MED ORDER — CEFOTETAN DISODIUM-DEXTROSE 2-2.08 GM-% IV SOLR
2.0000 g | INTRAVENOUS | Status: AC
  Administered 2017-06-11: 2 g via INTRAVENOUS
  Filled 2017-06-11: qty 50

## 2017-06-11 MED ORDER — PHENYLEPHRINE 40 MCG/ML (10ML) SYRINGE FOR IV PUSH (FOR BLOOD PRESSURE SUPPORT)
PREFILLED_SYRINGE | INTRAVENOUS | Status: DC | PRN
Start: 2017-06-11 — End: 2017-06-11
  Administered 2017-06-11: 80 ug via INTRAVENOUS
  Administered 2017-06-11: 160 ug via INTRAVENOUS
  Administered 2017-06-11 (×2): 80 ug via INTRAVENOUS

## 2017-06-11 MED ORDER — EPHEDRINE SULFATE-NACL 50-0.9 MG/10ML-% IV SOSY
PREFILLED_SYRINGE | INTRAVENOUS | Status: DC | PRN
  Administered 2017-06-11: 5 mg via INTRAVENOUS

## 2017-06-11 MED ORDER — PANTOPRAZOLE SODIUM 40 MG IV SOLR
40.0000 mg | Freq: Every day | INTRAVENOUS | Status: DC
  Administered 2017-06-11: 40 mg via INTRAVENOUS
  Filled 2017-06-11: qty 40

## 2017-06-11 MED ORDER — OXYCODONE HCL 5 MG/5ML PO SOLN: 5.0000 mg | Freq: Once | ORAL | Status: DC | PRN

## 2017-06-11 MED ORDER — LIDOCAINE 2% (20 MG/ML) 5 ML SYRINGE
INTRAMUSCULAR | Status: DC | PRN
  Administered 2017-06-11: 100 mg via INTRAVENOUS

## 2017-06-11 MED ORDER — PHENYLEPHRINE HCL 10 MG/ML IJ SOLN
INTRAMUSCULAR | Status: AC
  Filled 2017-06-11: qty 1

## 2017-06-11 MED ORDER — HYDROMORPHONE HCL-NACL 0.5-0.9 MG/ML-% IV SOSY
PREFILLED_SYRINGE | INTRAVENOUS | Status: AC
  Filled 2017-06-11: qty 2

## 2017-06-11 MED ORDER — FENTANYL CITRATE (PF) 250 MCG/5ML IJ SOLN
INTRAMUSCULAR | Status: AC
  Filled 2017-06-11: qty 5

## 2017-06-11 MED ORDER — DEXAMETHASONE SODIUM PHOSPHATE 10 MG/ML IJ SOLN
INTRAMUSCULAR | Status: AC
  Filled 2017-06-11: qty 1

## 2017-06-11 MED ORDER — 0.9 % SODIUM CHLORIDE (POUR BTL) OPTIME
TOPICAL | Status: DC | PRN
  Administered 2017-06-11: 1000 mL

## 2017-06-11 MED ORDER — SUGAMMADEX SODIUM 500 MG/5ML IV SOLN
INTRAVENOUS | Status: DC | PRN
  Administered 2017-06-11: 500 mg via INTRAVENOUS

## 2017-06-11 MED ORDER — INSULIN ASPART 100 UNIT/ML ~~LOC~~ SOLN
0.0000 [IU] | SUBCUTANEOUS | Status: DC
  Administered 2017-06-11 – 2017-06-12 (×4): 4 [IU] via SUBCUTANEOUS
  Administered 2017-06-12: 3 [IU] via SUBCUTANEOUS

## 2017-06-11 MED ORDER — MIDAZOLAM HCL 2 MG/2ML IJ SOLN
INTRAMUSCULAR | Status: AC
  Filled 2017-06-11: qty 2

## 2017-06-11 MED ORDER — OXYCODONE HCL 5 MG PO TABS: 5.0000 mg | ORAL_TABLET | Freq: Once | ORAL | Status: DC | PRN

## 2017-06-11 MED ORDER — PROPOFOL 10 MG/ML IV BOLUS
INTRAVENOUS | Status: DC | PRN
  Administered 2017-06-11: 200 mg via INTRAVENOUS

## 2017-06-11 MED ORDER — HYDRALAZINE HCL 20 MG/ML IJ SOLN: 10.0000 mg | INTRAMUSCULAR | Status: DC | PRN

## 2017-06-11 MED ORDER — MIDAZOLAM HCL 5 MG/5ML IJ SOLN
INTRAMUSCULAR | Status: DC | PRN
Start: 2017-06-11 — End: 2017-06-11
  Administered 2017-06-11: 2 mg via INTRAVENOUS

## 2017-06-11 SURGICAL SUPPLY — 61 items
APPLICATOR COTTON TIP 6IN STRL (MISCELLANEOUS) ×12 IMPLANT
APPLIER CLIP 5 13 M/L LIGAMAX5 (MISCELLANEOUS)
APPLIER CLIP ROT 10 11.4 M/L (STAPLE)
APPLIER CLIP ROT 13.4 12 LRG (CLIP)
BAG LAPAROSCOPIC 12 15 PORT 16 (BASKET) ×1 IMPLANT
BAG RETRIEVAL 12/15 (BASKET) ×2
BAG RETRIEVAL 12/15MM (BASKET) ×1
BLADE SURG 15 STRL LF DISP TIS (BLADE) ×1 IMPLANT
BLADE SURG 15 STRL SS (BLADE) ×2
CABLE HIGH FREQUENCY MONO STRZ (ELECTRODE) ×3 IMPLANT
CLIP APPLIE 5 13 M/L LIGAMAX5 (MISCELLANEOUS) IMPLANT
CLIP APPLIE ROT 10 11.4 M/L (STAPLE) IMPLANT
CLIP APPLIE ROT 13.4 12 LRG (CLIP) IMPLANT
DERMABOND ADVANCED (GAUZE/BANDAGES/DRESSINGS) ×2
DERMABOND ADVANCED .7 DNX12 (GAUZE/BANDAGES/DRESSINGS) ×1 IMPLANT
DEVICE PMI PUNCTURE CLOSURE (MISCELLANEOUS) ×3 IMPLANT
DEVICE SUT QUICK LOAD TK 5 (STAPLE) IMPLANT
DEVICE SUT TI-KNOT TK 5X26 (MISCELLANEOUS) IMPLANT
DEVICE SUTURE ENDOST 10MM (ENDOMECHANICALS) IMPLANT
DEVICE TI KNOT TK5 (MISCELLANEOUS)
DISSECTOR BLUNT TIP ENDO 5MM (MISCELLANEOUS) IMPLANT
ELECT REM PT RETURN 15FT ADLT (MISCELLANEOUS) ×3 IMPLANT
GAUZE SPONGE 4X4 12PLY STRL (GAUZE/BANDAGES/DRESSINGS) ×3 IMPLANT
GOWN STRL REUS W/TWL XL LVL3 (GOWN DISPOSABLE) ×12 IMPLANT
HANDLE STAPLE EGIA 4 XL (STAPLE) ×3 IMPLANT
HOVERMATT SINGLE USE (MISCELLANEOUS) ×3 IMPLANT
KIT BASIN OR (CUSTOM PROCEDURE TRAY) ×3 IMPLANT
MARKER SKIN DUAL TIP RULER LAB (MISCELLANEOUS) ×3 IMPLANT
NEEDLE SPNL 22GX3.5 QUINCKE BK (NEEDLE) ×3 IMPLANT
PACK UNIVERSAL I (CUSTOM PROCEDURE TRAY) ×3 IMPLANT
QUICK LOAD TK 5 (STAPLE)
RELOAD TRI 45 ART MED THCK BLK (STAPLE) ×3 IMPLANT
RELOAD TRI 45 ART MED THCK PUR (STAPLE) ×3 IMPLANT
RELOAD TRI 60 ART MED THCK BLK (STAPLE) ×3 IMPLANT
RELOAD TRI 60 ART MED THCK PUR (STAPLE) ×9 IMPLANT
SCISSORS LAP 5X45 EPIX DISP (ENDOMECHANICALS) IMPLANT
SET IRRIG TUBING LAPAROSCOPIC (IRRIGATION / IRRIGATOR) ×3 IMPLANT
SHEARS HARMONIC ACE PLUS 45CM (MISCELLANEOUS) ×3 IMPLANT
SLEEVE ADV FIXATION 5X100MM (TROCAR) ×6 IMPLANT
SLEEVE GASTRECTOMY 36FR VISIGI (MISCELLANEOUS) ×3 IMPLANT
SOLUTION ANTI FOG 6CC (MISCELLANEOUS) ×3 IMPLANT
SPONGE LAP 18X18 X RAY DECT (DISPOSABLE) ×3 IMPLANT
STAPLER VISISTAT 35W (STAPLE) ×3 IMPLANT
SUT MNCRL AB 4-0 PS2 18 (SUTURE) ×3 IMPLANT
SUT SURGIDAC NAB ES-9 0 48 120 (SUTURE) IMPLANT
SUT VIC AB 4-0 SH 18 (SUTURE) ×3 IMPLANT
SUT VICRYL 0 TIES 12 18 (SUTURE) ×3 IMPLANT
SYR 10ML ECCENTRIC (SYRINGE) ×3 IMPLANT
SYR 20CC LL (SYRINGE) ×3 IMPLANT
SYR 50ML LL SCALE MARK (SYRINGE) ×3 IMPLANT
TOWEL OR 17X26 10 PK STRL BLUE (TOWEL DISPOSABLE) ×6 IMPLANT
TOWEL OR NON WOVEN STRL DISP B (DISPOSABLE) ×3 IMPLANT
TRAY FOLEY W/METER SILVER 16FR (SET/KITS/TRAYS/PACK) IMPLANT
TROCAR ADV FIXATION 5X100MM (TROCAR) ×3 IMPLANT
TROCAR BLADELESS 15MM (ENDOMECHANICALS) ×3 IMPLANT
TROCAR BLADELESS OPT 5 100 (ENDOMECHANICALS) ×3 IMPLANT
TUBE CALIBRATION LAPBAND (TUBING) IMPLANT
TUBING CONNECTING 10 (TUBING) ×4 IMPLANT
TUBING CONNECTING 10' (TUBING) ×2
TUBING ENDO SMARTCAP (MISCELLANEOUS) ×3 IMPLANT
TUBING INSUF HEATED (TUBING) ×3 IMPLANT

## 2017-06-11 NOTE — Anesthesia Preprocedure Evaluation (Signed)
Anesthesia Evaluation  Patient identified by MRN, date of birth, ID band Patient awake    Reviewed: Allergy & Precautions, H&P , NPO status , Patient's Chart, lab work & pertinent test results  Airway Mallampati: II   Neck ROM: full    Dental   Pulmonary sleep apnea ,    breath sounds clear to auscultation       Cardiovascular hypertension,  Rhythm:regular Rate:Normal     Neuro/Psych  Headaches,    GI/Hepatic   Endo/Other  diabetes, Type 2Morbid obesity  Renal/GU      Musculoskeletal  (+) Arthritis ,   Abdominal   Peds  Hematology   Anesthesia Other Findings   Reproductive/Obstetrics                             Anesthesia Physical Anesthesia Plan  ASA: II  Anesthesia Plan: General   Post-op Pain Management:    Induction: Intravenous  PONV Risk Score and Plan: 2 and Ondansetron, Dexamethasone, Midazolam and Treatment may vary due to age or medical condition  Airway Management Planned: Oral ETT  Additional Equipment:   Intra-op Plan:   Post-operative Plan: Extubation in OR  Informed Consent: I have reviewed the patients History and Physical, chart, labs and discussed the procedure including the risks, benefits and alternatives for the proposed anesthesia with the patient or authorized representative who has indicated his/her understanding and acceptance.     Plan Discussed with: CRNA, Anesthesiologist and Surgeon  Anesthesia Plan Comments:         Anesthesia Quick Evaluation

## 2017-06-11 NOTE — H&P (Signed)
Brok Stocking 05/23/2017 4:31 PM Location: [place] Office Patient #: [id number] DOB: [birth year] Married / Language: English / Race: Black or African American Male   History of Present Illness Rodman Key B. Hassell Done MD; 05/23/2017 5:11 PM) The patient is a 51 year old male who presents for a bariatric surgery evaluation. Mr. Lemen was previously seen and evaluated by Dr. Kieth Brightly. He has had adulthood obesity with hypertension, diabetes mellitus, hyperlipideia and OSA. No GER noted. He has been to our [employer] and to our office and is fully vetted in the sleeve gastrectomy and its indications and risks. I have also explained the procedure to him and answered his questions. He has undergone six month supervised weight loss by Webb Silversmith, NP at [facility]. He has seen Dr. Ardath Sax in psychology and has been cleared for surgery from that standpoint. He has received the requisite nutritional training by the [facility] dieticians.   He is ready to proceed with sleeve gastrectomy which we plan to perform at [facility] on October 8th. His weight today is 356 and his BMI is 56.    Allergies Malachi Bonds, CMA; 05/23/2017 4:32 PM) No Known Drug Allergies 10/19/2016  Medication History Malachi Bonds, CMA; 05/23/2017 4:32 PM) GlipiZIDE (10MG  Tablet, Oral) Active. Januvia (50MG  Tablet, Oral) Active. Losartan Potassium-HCTZ (100-25MG  Tablet, Oral) Active. Multivitamin Adult (Oral) Active. Fish Oil (1000MG  Capsule, Oral) Active. Medications Reconciled  Vitals (Chemira Jones CMA; 05/23/2017 4:31 PM) 05/23/2017 4:31 PM Weight: 356.2 lb Height: 66.5in Body Surface Area: 2.57 m Body Mass Index: 56.63 kg/m  Pulse: 100 (Regular)  BP: 124/80 (Sitting, Left Arm, Standard)       Physical Exam (Kealan Buchan B. Hassell Done MD; 05/23/2017 5:13 PM) General Note: Obese AAM NAD HEENT unremarkable Neck supple Chest clear Heart SR without murmurs Abdomen -marked centripedal obesity;  nontender' Ext-bilateral arthritis of the knees Neuro alert and oriented x 3; normal motor and sensory function.     Assessment & Plan Rodman Key B. Hassell Done MD; 05/23/2017 5:15 PM) MORBID OBESITY (E66.01) Story: 51 yo male with morbid obesity, sleep apnea on CPAP, new diagnosis diabetes with last a1c of 8. He is most interested in sleeve gastrectomy. I think this is a great choice for him given his new DM diagnosis, lack of reflux symptoms and active lifestyle. Impression: The patient meets weight loss surgery criteria. Due to the above reasons, I think laparoscopic vertical sleeve gastrectomy is the best option for the patient. This is scheduled for OCT 8th, 2018  We discussed LSG. We discussed the preoperative, operative and postoperative process. I explained the surgery in detail including the performance of an EGD near the end of the surgery to test for leak. We discussed the typical hospital course including a 2-3 day stay baring any complications. The patient was given educational material. I quoted the patient that most patients can lose up to 50-70% of their excess weight. We did discuss the possibility of weight regain several years after the procedure.  The risks of infection, bleeding, pain, scarring, weight regain, too little or too much weight loss, vitamin deficiencies and need for lifelong vitamin supplementation, hair loss, need for protein supplementation, leaks, stricture, reflux, food intolerance, gallstone formation, hernia, need for reoperation, need for open surgery, injury to spleen or surrounding structures, DVT's, PE, and death again discussed with the patient and the patient expressed understanding and desires to proceed with laparoscopic sleeve gastrectomy, possible open, intraoperative endoscopy.  We discussed that before and after surgery that there would be an alteration in  their diet. I explained that we have put them on a diet 2 weeks before surgery. I also explained that  they would be on a liquid diet for 2 weeks after surgery. We discussed that they would have to avoid certain foods after surgery. We discussed the importance of physical activity as well as compliance with our dietary and supplement recommendations and routine follow-up.  His UGI was normal-no HH and he clinically has no GER.  Plan Lap sleeve gastrectomy  Matt B. Hassell Done, MD, FACS

## 2017-06-11 NOTE — Op Note (Signed)
Preoperative diagnosis: laparoscopic sleeve gastrectomy  Postoperative diagnosis: Same   Procedure: Upper endoscopy   Surgeon: Gurney Maxin, M.D.  Anesthesia: Gen.   Indications for procedure: This patient was undergoing a laparoscopic sleeve gastrectomy.   Description of procedure: The endoscopy was placed in the mouth and into the oropharynx and under endoscopic vision it was advanced to the esophagogastric junction. The pouch was insufflated and no bleeding or bubbles were seen. The GEJ was identified at 45cm from the teeth. No bleeding or leaks were detected. The scope was withdrawn without difficulty.   Gurney Maxin, M.D. General, Bariatric, & Minimally Invasive Surgery [facility]

## 2017-06-11 NOTE — Anesthesia Procedure Notes (Signed)
Procedure Name: Intubation Date/Time: 06/11/2017 1:38 PM Performed by: Lind Covert Pre-anesthesia Checklist: Patient identified, Emergency Drugs available, Suction available, Timeout performed and Patient being monitored Patient Re-evaluated:Patient Re-evaluated prior to induction Oxygen Delivery Method: Circle system utilized Preoxygenation: Pre-oxygenation with 100% oxygen Induction Type: IV induction Ventilation: Mask ventilation with difficulty Laryngoscope Size: Mac and 4 Grade View: Grade II Tube type: Oral Tube size: 7.5 mm Number of attempts: 1 Airway Equipment and Method: Stylet Placement Confirmation: ETT inserted through vocal cords under direct vision,  positive ETCO2 and breath sounds checked- equal and bilateral Secured at: 22 cm Tube secured with: Tape Dental Injury: Teeth and Oropharynx as per pre-operative assessment

## 2017-06-11 NOTE — Transfer of Care (Signed)
Immediate Anesthesia Transfer of Care Note  Patient: Devon Rios  Procedure(s) Performed: Procedure(s): LAPAROSCOPIC GASTRIC SLEEVE RESECTION WITH UPPER ENDO (N/A)  Patient Location: PACU  Anesthesia Type:General  Level of Consciousness:  sedated, patient cooperative and responds to stimulation  Airway & Oxygen Therapy:Patient Spontanous Breathing and Patient connected to face mask oxgen  Post-op Assessment:  Report given to PACU RN and Post -op Vital signs reviewed and stable  Post vital signs:  Reviewed and stable  Last Vitals:  Vitals:   06/11/17 1152  BP: (!) 150/93  Pulse: 90  Resp: 18  Temp: 36.7 C  SpO2: 09%    Complications: No apparent anesthesia complications

## 2017-06-11 NOTE — Op Note (Signed)
Surgeon: Kaylyn Lim, MD, FACS  Asst:  Gurney Maxin, MD  Anes:  General endotracheal  Procedure: Laparoscopic sleeve gastrectomy and upper endoscopy  Diagnosis: Morbid obesity  Complications: None   EBL:   15 cc  Description of Procedure:  The patient was take to OR 2 and given general anesthesia.  The abdomen was prepped with Technicare and draped sterilely.  A timeout was performed.  Access to the abdomen was achieved with a 5 mm Optiview through the left upper quadrant.  Following insufflation, the state of the abdomen was found to be free of adhesions but there was much omental and foregut fat and the left lateral segment was enlarged.  The ViSiGi 36Fr tube was inserted to deflate the stomach and was pulled back into the esophagus.    The pylorus was identified and we measured 6 cm back and marked the antrum.  At that point we began dissection to take down the greater curvature of the stomach using the Harmonic scalpel.  This dissection was taken all the way up to the left crus.  Exposure there was more of a challenge and we positioned the camera laterally.    Posterior attachments of the stomach were also taken down.    The ViSiGi tube was then passed into the antrum and suction applied so that it was snug along the lessor curvature.  The "crow's foot" or incisura was identified.  The sleeve gastrectomy was begun using the [employer] stapler beginning with a 4.5 black load with TRS.  This was followed by a 6 cm black load with TRS and then multiple firings of the purple loads with TRS.  When the sleeve was complete the tube was taken off suction and insufflated briefly.  The tube was withdrawn.  Upper endoscopy was then performed by Dr. Kieth Brightly.     The specimen was extracted through the 15 trocar site using the bag technique.  Wounds were infiltrated with Exparel and closed with 4-0 Monocryl and Dermabond.  The 15 mm trocar site was closed with a single 0 vicryl.  Catalina Antigua B. Hassell Done, [place], [facility], [place]

## 2017-06-11 NOTE — Interval H&P Note (Signed)
History and Physical Interval Note:  06/11/2017 1:01 PM  Devon Rios  has presented today for surgery, with the diagnosis of MORBID OBESITY  The various methods of treatment have been discussed with the patient and family. After consideration of risks, benefits and other options for treatment, the patient has consented to  Procedure(s): LAPAROSCOPIC GASTRIC SLEEVE RESECTION WITH UPPER ENDO (N/A) as a surgical intervention .  The patient's history has been reviewed, patient examined, no change in status, stable for surgery.  I have reviewed the patient's chart and labs.  Questions were answered to the patient's satisfaction.     Miaa Latterell B

## 2017-06-11 NOTE — Anesthesia Postprocedure Evaluation (Signed)
Anesthesia Post Note  Patient: Devon Rios  Procedure(s) Performed: LAPAROSCOPIC GASTRIC SLEEVE RESECTION WITH UPPER ENDO (N/A Abdomen)     Patient location during evaluation: PACU Anesthesia Type: General Level of consciousness: awake and alert Pain management: pain level controlled Vital Signs Assessment: post-procedure vital signs reviewed and stable Respiratory status: spontaneous breathing, nonlabored ventilation, respiratory function stable and patient connected to nasal cannula oxygen Cardiovascular status: blood pressure returned to baseline and stable Postop Assessment: no apparent nausea or vomiting Anesthetic complications: no    Last Vitals:  Vitals:   06/11/17 1630 06/11/17 1645  BP: (!) 155/83 (!) 153/89  Pulse: 81 84  Resp: 15 13  Temp:    SpO2: 100% 99%    Last Pain:  Vitals:   06/11/17 1645  TempSrc:   PainSc: Asleep                 Amairany Schumpert S

## 2017-06-12 ENCOUNTER — Encounter: Payer: Self-pay | Admitting: Internal Medicine

## 2017-06-12 ENCOUNTER — Telehealth: Payer: Self-pay

## 2017-06-12 ENCOUNTER — Ambulatory Visit: Payer: BLUE CROSS/BLUE SHIELD

## 2017-06-12 ENCOUNTER — Encounter (HOSPITAL_COMMUNITY): Payer: Self-pay | Admitting: Surgery

## 2017-06-12 ENCOUNTER — Inpatient Hospital Stay (HOSPITAL_COMMUNITY): Payer: BLUE CROSS/BLUE SHIELD

## 2017-06-12 DIAGNOSIS — Z9889 Other specified postprocedural states: Secondary | ICD-10-CM

## 2017-06-12 LAB — GLUCOSE, CAPILLARY
GLUCOSE-CAPILLARY: 106 mg/dL — AB (ref 65–99)
GLUCOSE-CAPILLARY: 138 mg/dL — AB (ref 65–99)
GLUCOSE-CAPILLARY: 155 mg/dL — AB (ref 65–99)
GLUCOSE-CAPILLARY: 192 mg/dL — AB (ref 65–99)
Glucose-Capillary: 175 mg/dL — ABNORMAL HIGH (ref 65–99)

## 2017-06-12 LAB — CBC WITH DIFFERENTIAL/PLATELET
BASOS PCT: 0 %
Basophils Absolute: 0 10*3/uL (ref 0.0–0.1)
Eosinophils Absolute: 0 10*3/uL (ref 0.0–0.7)
Eosinophils Relative: 0 %
HEMATOCRIT: 43.5 % (ref 39.0–52.0)
Hemoglobin: 14.1 g/dL (ref 13.0–17.0)
Lymphocytes Relative: 14 %
Lymphs Abs: 1.2 10*3/uL (ref 0.7–4.0)
MCH: 27.7 pg (ref 26.0–34.0)
MCHC: 32.4 g/dL (ref 30.0–36.0)
MCV: 85.5 fL (ref 78.0–100.0)
MONO ABS: 0.4 10*3/uL (ref 0.1–1.0)
MONOS PCT: 5 %
NEUTROS ABS: 7.1 10*3/uL (ref 1.7–7.7)
Neutrophils Relative %: 81 %
Platelets: 185 10*3/uL (ref 150–400)
RBC: 5.09 MIL/uL (ref 4.22–5.81)
RDW: 15 % (ref 11.5–15.5)
WBC: 8.7 10*3/uL (ref 4.0–10.5)

## 2017-06-12 MED ORDER — GLUCOSE BLOOD VI STRP: ORAL_STRIP | 3 refills | Status: DC

## 2017-06-12 MED FILL — oxyCODONE HCL 5 MG/5ML SOLN: 5 | 2 days supply | Qty: 120 | Fill #0

## 2017-06-12 NOTE — Progress Notes (Signed)
*  PRELIMINARY RESULTS* Vascular Ultrasound Lower ext venous duplex has been completed.  Preliminary findings: no obvious evidence of DVT.   Landry Mellow, RDMS, RVT  06/12/2017, 9:29 AM

## 2017-06-12 NOTE — Plan of Care (Signed)
Problem: Food- and Nutrition-Related Knowledge Deficit (NB-1.1) Goal: Nutrition education Formal process to instruct or train a patient/client in a skill or to impart knowledge to help patients/clients voluntarily manage or modify food choices and eating behavior to maintain or improve health. Outcome: Completed/Met Date Met: 06/12/17 Nutrition Education Note  Received consult for diet education per DROP protocol.   Discussed 2 week post op diet with pt. Emphasized that liquids must be non carbonated, non caffeinated, and sugar free. Fluid goals discussed. Pt to follow up with outpatient bariatric RD for further diet progression after 2 weeks. Multivitamins and minerals also reviewed. Teach back method used, pt expressed understanding, expect good compliance.   Diet: First 2 Weeks  You will see the nutritionist about two (2) weeks after your surgery. The nutritionist will increase the types of foods you can eat if you are handling liquids well:  If you have severe vomiting or nausea and cannot handle clear liquids lasting longer than 1 day, call your surgeon  Protein Shake  Drink at least 2 ounces of shake 5-6 times per day  Each serving of protein shakes (usually 8 - 12 ounces) should have a minimum of:  15 grams of protein  And no more than 5 grams of carbohydrate  Goal for protein each day:  Men = 80 grams per day  Women = 60 grams per day  Protein powder may be added to fluids such as non-fat milk or Lactaid milk or Soy milk (limit to 35 grams added protein powder per serving)   Hydration  Slowly increase the amount of water and other clear liquids as tolerated (See Acceptable Fluids)  Slowly increase the amount of protein shake as tolerated  Sip fluids slowly and throughout the day  May use sugar substitutes in small amounts (no more than 6 - 8 packets per day; i.e. Splenda)   Fluid Goal  The first goal is to drink at least 8 ounces of protein shake/drink per day (or as directed  by the nutritionist); some examples of protein shakes are Premier Protein, [employer], [employer], EAS Edge HP, and Unjury. See handout from pre-op Bariatric Education Class:  Slowly increase the amount of protein shake you drink as tolerated  You may find it easier to slowly sip shakes throughout the day  It is important to get your proteins in first  Your fluid goal is to drink 64 - 100 ounces of fluid daily  It may take a few weeks to build up to this  32 oz (or more) should be clear liquids  And  32 oz (or more) should be full liquids (see below for examples)  Liquids should not contain sugar, caffeine, or carbonation   Clear Liquids:  Water or Sugar-free flavored water (i.e. Fruit H2O, Propel)  Decaffeinated coffee or tea (sugar-free)  Crystal Lite, Wyler's Lite, Minute Maid Lite  Sugar-free Jell-O  Bouillon or broth  Sugar-free Popsicle: *Less than 20 calories each; Limit 1 per day   Full Liquids:  Protein Shakes/Drinks + 2 choices per day of other full liquids  Full liquids must be:  No More Than 12 grams of Carbs per serving  No More Than 3 grams of Fat per serving  Strained low-fat cream soup  Non-Fat milk  Fat-free Lactaid Milk  Sugar-free yogurt (Dannon Lite & Fit, [place] yogurt, Oikos Zero)   Mariana Single RD, LDN Clinical Nutrition Pager # - [phone]

## 2017-06-12 NOTE — Progress Notes (Signed)
Patient alert and oriented, pain is controlled. Patient is tolerating fluids, advanced to protein shake today, patient is tolerating well.  Reviewed Gastric sleeve discharge instructions with patient and patient is able to articulate understanding.  Provided information on BELT program, Support Group and [facility] outpatient pharmacy. All questions answered, will continue to monitor.  

## 2017-06-12 NOTE — Discharge Instructions (Signed)
° ° ° °GASTRIC BYPASS/SLEEVE ° Home Care Instructions ° ° These instructions are to help you care for yourself when you go home. ° °Call: If you have any problems. °• Call [phone] and ask for the surgeon on call °• If you need immediate assistance come to the ER at [facility]. Tell the ER staff you are a new post-op gastric bypass or gastric sleeve patient  °Signs and symptoms to report: • Severe  vomiting or nausea °o If you cannot handle clear liquids for longer than 1 day, call your surgeon °• Abdominal pain which does not get better after taking your pain medication °• Fever greater than 100.4°  F and chills °• Heart rate over 100 beats a minute °• Trouble breathing °• Chest pain °• Redness,  swelling, drainage, or foul odor at incision (surgical) sites °• If your incisions open or pull apart °• Swelling or pain in calf (lower leg) °• Diarrhea (Loose bowel movements that happen often), frequent watery, uncontrolled bowel movements °• Constipation, (no bowel movements for 3 days) if this happens: °o Take Milk of Magnesia, 2 tablespoons by mouth, 3 times a day for 2 days if needed °o Stop taking Milk of Magnesia once you have had a bowel movement °o Call your doctor if constipation continues °Or °o Take Miralax  (instead of Milk of Magnesia) following the label instructions °o Stop taking Miralax once you have had a bowel movement °o Call your doctor if constipation continues °• Anything you think is “abnormal for you” °  °Normal side effects after surgery: • Unable to sleep at night or unable to concentrate °• Irritability °• Being tearful (crying) or depressed ° °These are common complaints, possibly related to your anesthesia, stress of surgery, and change in lifestyle, that usually go away a few weeks after surgery. If these feelings continue, call your medical doctor.  °Wound Care: You may have surgical glue, steri-strips, or staples over your incisions after surgery °• Surgical glue: Looks like clear  film over your incisions and will wear off a little at a time °• Steri-strips: Adhesive strips of tape over your incisions. You may notice a yellowish color on skin under the steri-strips. This is used to make the steri-strips stick better. Do not pull the steri-strips off - let them fall off °• Staples: Staples may be removed before you leave the hospital °o If you go home with staples, call [facility] for an appointment with your surgeon’s nurse to have staples removed 10 days after surgery, [phone] °• Showering: You may shower two (2) days after your surgery unless your surgeon tells you differently °o Wash gently around incisions with warm soapy water, rinse well, and gently pat dry °o If you have a drain (tube from your incision), you may need someone to hold this while you shower °o No tub baths until staples are removed and incisions are healed °  °Medications: • Medications should be liquid or crushed if larger than the size of a dime °• Extended release pills (medication that releases a little bit at a time through the  day) should not be crushed °• Depending on the size and number of medications you take, you may need to space (take a few throughout the day)/change the time you take your medications so that you do not over-fill your pouch (smaller stomach) °• Make sure you follow-up with you primary care physician to make medication changes needed during rapid weight loss and life -style changes °•   If you have diabetes, follow up with your doctor that orders your diabetes medication(s) within one week after surgery and check your blood sugar regularly ° °• Do not drive while taking narcotics (pain medications) ° °• Do not take acetaminophen (Tylenol) and Roxicet or Lortab Elixir at the same time since these pain medications contain acetaminophen °  °Diet:  °First 2 Weeks You will see the nutritionist about two (2) weeks after your surgery. The nutritionist will increase the types of  foods you can eat if you are handling liquids well: °• If you have severe vomiting or nausea and cannot handle clear liquids lasting longer than 1 day call your surgeon °Protein Shake °• Drink at least 2 ounces of shake 5-6 times per day °• Each serving of protein shakes (usually 8-12 ounces) should have a minimum of: °o 15 grams of protein °o And no more than 5 grams of carbohydrate °• Goal for protein each day: °o Men = 80 grams per day °o Women = 60 grams per day °  ° • Protein powder may be added to fluids such as non-fat milk or Lactaid milk or Soy milk (limit to 35 grams added protein powder per serving) ° °Hydration °• Slowly increase the amount of water and other clear liquids as tolerated (See Acceptable Fluids) °• Slowly increase the amount of protein shake as tolerated °• Sip fluids slowly and throughout the day °• May use sugar substitutes in small amounts (no more than 6-8 packets per day; i.e. Splenda) ° °Fluid Goal °• The first goal is to drink at least 8 ounces of protein shake/drink per day (or as directed by the nutritionist); some examples of protein shakes are Syntrax Nectar, Adkins Advantage, EAS Edge HP, and Unjury. - See handout from pre-op Bariatric Education Class: °o Slowly increase the amount of protein shake you drink as tolerated °o You may find it easier to slowly sip shakes throughout the day °o It is important to get your proteins in first °• Your fluid goal is to drink 64-100 ounces of fluid daily °o It may take a few weeks to build up to this  °• 32 oz. (or more) should be clear liquids °And °• 32 oz. (or more) should be full liquids (see below for examples) °• Liquids should not contain sugar, caffeine, or carbonation ° °Clear Liquids: °• Water of Sugar-free flavored water (i.e. Fruit H²O, Propel) °• Decaffeinated coffee or tea (sugar-free) °• Crystal lite, Wyler’s Lite, Minute Maid Lite °• Sugar-free Jell-O °• Bouillon or broth °• Sugar-free Popsicle:    - Less than 20 calories  each; Limit 1 per day ° °Full Liquids: °                  Protein Shakes/Drinks + 2 choices per day of other full liquids °• Full liquids must be: °o No More Than 12 grams of Carbs per serving °o No More Than 3 grams of Fat per serving °• Strained low-fat cream soup °• Non-Fat milk °• Fat-free Lactaid Milk °• Sugar-free yogurt (Dannon Lite & Fit, Greek yogurt) ° °  °Vitamins and Minerals • Start 1 day after surgery unless otherwise directed by your surgeon °• 2 Chewable Multivitamin / Multimineral Supplement with iron  °• Chewable Calcium Citrate with Vitamin D-3 °(Example: 3 Chewable Calcium  Plus 600 with Vitamin D-3) °o Take 500 mg three (3) times a day for a total of 1500 mg each day °o Do not take all 3 doses of calcium   at one time as it may cause constipation, and you can only absorb 500 mg at a time °o Do not mix multivitamins containing iron with calcium supplements;  take 2 hours apart °• Menstruating women and those at risk for anemia ( a blood disease that causes weakness) may need extra iron °o Talk to your doctor to see if you need more iron °• If you need extra iron: Total daily Iron recommendation (including Vitamins) is 50 to 100 mg Iron/day °• Do not stop taking or change any vitamins or minerals until you talk to your nutritionist or surgeon °• Your nutritionist and/or surgeon must approve all vitamin and mineral supplements °  °Activity and Exercise: It is important to continue walking at home. Limit your physical activity as instructed by your doctor. During this time, use these guidelines: °• Do not lift anything greater than ten  (10) pounds for at least two (2) weeks °• Do not go back to work or drive until your surgeon says you can °• You may have sex when you feel comfortable °o It is VERY important for male patients to use a reliable birth control method; fertility often increase after surgery °o Do not get pregnant for at least 18 months °• Start exercising as soon as your doctor tells  you that you can °o Make sure your doctor approves any physical activity °• Start with a simple walking program °• Walk 5-15 minutes each day, 7 days per week °• Slowly increase until you are walking 30-45 minutes per day °• Consider joining our BELT program. [phone] or email [email or web address] °  °Special Instructions Things to remember: °• Use your CPAP when sleeping if this applies to you °• Consider buying a medical alert bracelet that says you had lap-band surgery °  °  You will likely have your first fill (fluid added to your band) 6 - 8 weeks after surgery °• [facility] has a free Bariatric Surgery Support Group that meets monthly, the 3rd Thursday, 6pm. [facility]. You can see classes online at [email or web address] °• It is very important to keep all follow up appointments with your surgeon, nutritionist, primary care physician, and behavioral health practitioner °o After the first year, please follow up with your bariatric surgeon and nutritionist at least once a year in order to maintain best weight loss results °      °             [facility]:  [phone] ° °             [facility]: [phone] ° °             Bariatric Nurse Coordinator: [phone]  °Gastric Bypass/Sleeve Home Care Instructions  Rev. 10/2012    ° °                                                    Reviewed and Endorsed °                                                   by [facility] Patient Education Committee, Jan, 2014 ° ° ° ° ° ° ° ° ° °

## 2017-06-12 NOTE — Telephone Encounter (Signed)
Mrs Bitting request one touch ultra test strips cking bid to [facility]. Pt has appt scheduled with R BaityNP. Ms Cowles advised done and will pick up at pharmacy.

## 2017-06-12 NOTE — Discharge Summary (Signed)
Physician Discharge Summary  Patient ID: Devon Rios MRN: [id number] DOB/AGE: 1965-10-11 51 y.o.  Admit date: 06/11/2017 Discharge date: 06/12/2017  Admission Diagnoses:  Morbid obesity and DM  Discharge Diagnoses:  same  Active Problems:   Morbid obesity with BMI of 50.0-59.9, adult [facility])   Surgery:  Laparoscopic sleeve gastrectomy  Discharged Condition: improved  Hospital Course:   Had surgery and begun on clear liquids.  Tolerated these and was advanced to bariatric full liquids and was ready to go home on PD1  Consults: none  Significant Diagnostic Studies: none    Discharge Exam: Blood pressure (!) 177/85, pulse 78, temperature 97.7 F (36.5 C), temperature source Axillary, resp. rate 18, height 5\' 6"  (1.676 m), weight (!) 159.1 kg (350 lb 11.2 oz), SpO2 98 %. Incisions OK  Disposition: Final discharge disposition not confirmed  Discharge Instructions    Ambulate hourly while awake    Complete by:  As directed    Call MD for:  difficulty breathing, headache or visual disturbances    Complete by:  As directed    Call MD for:  persistant dizziness or light-headedness    Complete by:  As directed    Call MD for:  persistant nausea and vomiting    Complete by:  As directed    Call MD for:  redness, tenderness, or signs of infection (pain, swelling, redness, odor or green/yellow discharge around incision site)    Complete by:  As directed    Call MD for:  severe uncontrolled pain    Complete by:  As directed    Call MD for:  temperature >101 F    Complete by:  As directed    Diet bariatric full liquid    Complete by:  As directed    Incentive spirometry    Complete by:  As directed    Perform hourly while awake     Allergies as of 06/12/2017   No Known Allergies     Medication List    TAKE these medications   Fish Oil 1000 MG Caps Take 1,000 mg by mouth daily.   glipiZIDE 10 MG tablet Commonly known as:  GLUCOTROL Take 1 tablet (10 mg total) by mouth 2  (two) times daily before a meal. Notes to patient:  Monitor Blood Sugar Frequently and keep a log for primary care physician, you may need to adjust medication dosage with rapid weight loss.     glucose blood test strip Commonly known as:  ONE TOUCH TEST STRIPS 1 each by Other route 2 (two) times daily. Ultra Dx E11.9   losartan-hydrochlorothiazide 100-25 MG tablet Commonly known as:  HYZAAR TAKE ONE TABLET BY MOUTH ONCE DAILY Notes to patient:  Monitor Blood Pressure Daily and keep a log for primary care physician.  Monitor for symptoms of dehydration.  You may need to make changes to your medications with rapid weight loss.     multivitamin tablet Take 1 tablet by mouth daily.   ondansetron 4 MG tablet Commonly known as:  ZOFRAN Take 4 mg by mouth every 6 (six) hours as needed for nausea or vomiting.   ONETOUCH DELICA LANCETS 54Y Misc 1 each by Does not apply route 2 (two) times daily. Dx E11.9   pantoprazole 40 MG tablet Commonly known as:  PROTONIX Take 40 mg by mouth daily.   sitaGLIPtin 50 MG tablet Commonly known as:  JANUVIA Take 1 tablet (50 mg total) by mouth daily. Notes to patient:  Monitor Blood Sugar Frequently and  keep a log for primary care physician, you may need to adjust medication dosage with rapid weight loss.        Follow-up Information    Kinsinger, Arta Bruce, MD. Go on 07/04/2017.   Specialty:  General Surgery Why:  Dr Kaylyn Lim in [place] office at [address] information: [place] [address] [phone]        Surgery, [facility] Follow up.   Specialty:  General Surgery Contact information: [address] [place] [address] [phone]           Signed: Pedro Earls 06/12/2017, 3:14 PM

## 2017-06-12 NOTE — Progress Notes (Signed)
Patient alert and oriented, Post op day 1.  Provided support and encouragement.  Encouraged pulmonary toilet, ambulation and small sips of liquids.  Completed 12 ounces of clear fluid started protein  All questions answered.  Will continue to monitor.

## 2017-06-13 ENCOUNTER — Telehealth: Payer: Self-pay

## 2017-06-13 MED ORDER — GLUCOSE BLOOD VI STRP: ORAL_STRIP | 0 refills | Status: DC

## 2017-06-13 MED ORDER — BAYER CONTOUR MONITOR W/DEVICE KIT: PACK | 0 refills | Status: DC

## 2017-06-13 MED ORDER — GLUCOSE BLOOD VI STRP: ORAL_STRIP | 5 refills | Status: DC

## 2017-06-13 NOTE — Telephone Encounter (Signed)
Ok to send in meter and strips for whichever meter his insurance will cover

## 2017-06-13 NOTE — Addendum Note (Signed)
Addended by: Helene Shoe on: 06/13/2017 04:20 PM   Modules accepted: Orders

## 2017-06-13 NOTE — Telephone Encounter (Signed)
Devon Rios on her way to [facility]; sent Bayer contour meter and test strips as instructed. Devon Rios will pick up at [place] [address].

## 2017-06-13 NOTE — Telephone Encounter (Signed)
Cassandra pts wife (DPR signed) left v/m that the one touch is not covered by insurance and pts wife request different meter and strips to [facility] [address]. Ins will cover ascensia or bayer products. Cassandra request cb ASAP.Please advise.

## 2017-06-15 ENCOUNTER — Telehealth (HOSPITAL_COMMUNITY): Payer: Self-pay

## 2017-06-15 NOTE — Telephone Encounter (Signed)
[facility] left v/m about getting diabetic supplies to sub for one touch; I spoke with cassandra and got diabetic supplies at [facility] and nothing further needed.

## 2017-06-15 NOTE — Telephone Encounter (Signed)
Made discharge phone call to patient. Asking the following questions.    1. Do you have someone to care for you now that you are home?  Independent 2. Are you having pain now that is not relieved by your pain medication?  no 3. Are you able to drink the recommended daily amount of fluids (48 ounces minimum/day) and protein (60-80 grams/day) as prescribed by the dietitian or nutritional counselor?  37 ounces of broth, 45 grams of protein 4. Are you taking the vitamins and minerals as prescribed?  yes 5. Do you have the "on call" number to contact your surgeon if you have a problem or question?  yes 6. Are your incisions free of redness, swelling or drainage? (If steri strips, address that these can fall off, shower as tolerated) yes 7. Have your bowels moved since your surgery?  If not, are you passing gas?  Yes with mom 8. Are you up and walking 3-4 times per day?  yes 9. Were you provided your discharge medications before your surgery or before you were discharged from the hospital and are you taking them without problem?  yes

## 2017-06-21 ENCOUNTER — Ambulatory Visit (INDEPENDENT_AMBULATORY_CARE_PROVIDER_SITE_OTHER): Payer: BLUE CROSS/BLUE SHIELD | Admitting: Internal Medicine

## 2017-06-21 ENCOUNTER — Encounter: Payer: Self-pay | Admitting: Internal Medicine

## 2017-06-21 VITALS — BP 136/80 | HR 88 | Temp 98.4°F | Wt 332.0 lb

## 2017-06-21 DIAGNOSIS — E119 Type 2 diabetes mellitus without complications: Secondary | ICD-10-CM

## 2017-06-21 DIAGNOSIS — Z9884 Bariatric surgery status: Secondary | ICD-10-CM | POA: Diagnosis not present

## 2017-06-21 NOTE — Assessment & Plan Note (Signed)
Will d/c Januvia Advised him if sugars start getting below 80, to cut Glipizide in half and notify me Consume a low carb diet, increase the exercise Will repeat A1C in 3 months

## 2017-06-21 NOTE — Progress Notes (Signed)
Subjective:    Patient ID: Devon Rios, male    DOB: 01/07/1966, 51 y.o.   MRN: [id number]  HPI  Pt presents to the clinic today to follow up s/p his gastric sleeve that took place 10/8. He is 10 days post op, free of pain, and has lost 28 pounds. He is still on a clear liquid diet. He reports he follows up with his surgeon on Tuesday. His main concern since his surgery, is that his sugars have been running lower than the ever have. His sugars currently range 90-130. He is taking the Glipizide 10 mg BID but has not taken Januvia since discharge. He denies hypoglycemic episodes. He also reports his knee pain has improved significantly with the 28 lb weight loss. He was actually able to walk 4 blocks without stopping to rest.  Review of Systems  Past Medical History:  Diagnosis Date  . Arthritis    oa left knee worse than right  . DM2 (diabetes mellitus, type 2) ([place]) 07/21/2014  . Frequent headaches    weekly  . Hypertension   . OSA on CPAP 2013  . Severe obesity (BMI >= 40) ([place]) 07/14/2014    Current Outpatient Prescriptions  Medication Sig Dispense Refill  . Blood Glucose Monitoring Suppl (BAYER CONTOUR MONITOR) w/Device KIT Check blood sugar twice a day and as directed Dx E11.9 1 kit 0  . Calcium 500 MG CHEW Chew 1 each by mouth 3 (three) times daily.    Marland Kitchen glipiZIDE (GLUCOTROL) 10 MG tablet Take 1 tablet (10 mg total) by mouth 2 (two) times daily before a meal. 180 tablet 0  . glucose blood (BAYER CONTOUR TEST) test strip Check blood sugar twice a day and as directed Dx E11.9 100 each 5  . losartan-hydrochlorothiazide (HYZAAR) 100-25 MG tablet TAKE ONE TABLET BY MOUTH ONCE DAILY 90 tablet 1  . Multiple Vitamins-Minerals (CELEBRATE MULTI-COMPLETE 45) CHEW Chew 1 tablet by mouth 2 (two) times daily.    . Omega-3 Fatty Acids (FISH OIL) 1000 MG CAPS Take 1,000 mg by mouth daily.     . ondansetron (ZOFRAN) 4 MG tablet Take 4 mg by mouth every 6 (six) hours as needed for nausea or  vomiting.    [name] DELICA LANCETS 34V MISC 1 each by Does not apply route 2 (two) times daily. Dx E11.9 100 each 6  . pantoprazole (PROTONIX) 40 MG tablet Take 40 mg by mouth daily.    . sitaGLIPtin (JANUVIA) 50 MG tablet Take 1 tablet (50 mg total) by mouth daily. 90 tablet 1   No current facility-administered medications for this visit.     No Known Allergies  Family History  Problem Relation Age of Onset  . Hypertension Mother   . Diabetes Mother   . Cancer Maternal Grandmother        Lung  . Stroke Neg Hx     Social History   Social History  . Marital status: Married    Spouse name: N/A  . Number of children: N/A  . Years of education: N/A   Occupational History  . Not on file.   Social History Main Topics  . Smoking status: Never Smoker  . Smokeless tobacco: Never Used  . Alcohol use 0.0 oz/week     Comment: rare  . Drug use: No  . Sexual activity: Yes   Other Topics Concern  . Not on file   Social History Narrative  . No narrative on file     Constitutional:  Pt reports weight loss. Denies fever, malaise, fatigue, headache.  Skin: Denies redness, rashes, lesions or ulcercations.  Neurological: Denies dizziness, difficulty with memory, difficulty with speech or problems with balance and coordination.    No other specific complaints in a complete review of systems (except as listed in HPI above).     Objective:   Physical Exam  BP 136/80   Pulse 88   Temp 98.4 F (36.9 C) (Oral)   Wt (!) 332 lb (150.6 kg)   SpO2 98%   BMI 53.59 kg/m  Wt Readings from Last 3 Encounters:  06/21/17 (!) 332 lb (150.6 kg)  06/12/17 (!) 350 lb 11.2 oz (159.1 kg)  06/08/17 (!) 353 lb 9.6 oz (160.4 kg)    General: Appears his stated age, obese in NAD. Skin: Warm, dry and intact. No ulcerations noted. Cardiovascular: Normal rate and rhythm.  Pulmonary/Chest: Normal effort and positive vesicular breath sounds. No respiratory distress. No wheezes, rales or ronchi  noted.   Neurological: Alert and oriented.   BMET    Component Value Date/Time   NA 139 06/08/2017 0854   K 4.4 06/08/2017 0854   CL 104 06/08/2017 0854   CO2 27 06/08/2017 0854   GLUCOSE 113 (H) 06/08/2017 0854   BUN 22 (H) 06/08/2017 0854   CREATININE 1.60 (H) 06/11/2017 1801   CALCIUM 9.8 06/08/2017 0854   GFRNONAA 49 (L) 06/11/2017 1801   GFRAA 56 (L) 06/11/2017 1801    Lipid Panel     Component Value Date/Time   CHOL 142 07/03/2016 1526   TRIG 133.0 07/03/2016 1526   HDL 40.00 07/03/2016 1526   CHOLHDL 4 07/03/2016 1526   VLDL 26.6 07/03/2016 1526   LDLCALC 76 07/03/2016 1526    CBC    Component Value Date/Time   WBC 8.7 06/12/2017 0552   RBC 5.09 06/12/2017 0552   HGB 14.1 06/12/2017 0552   HCT 43.5 06/12/2017 0552   PLT 185 06/12/2017 0552   MCV 85.5 06/12/2017 0552   MCH 27.7 06/12/2017 0552   MCHC 32.4 06/12/2017 0552   RDW 15.0 06/12/2017 0552   LYMPHSABS 1.2 06/12/2017 0552   MONOABS 0.4 06/12/2017 0552   EOSABS 0.0 06/12/2017 0552   BASOSABS 0.0 06/12/2017 0552    Hgb A1C Lab Results  Component Value Date   HGBA1C 7.3 (H) 06/08/2017            Assessment & Plan:   S/p Gastric Sleeve:  Congratulated him on his weight loss Encouraged him to adhere closely to the surgeon's recommendations following diet post op He will follow up with his surgeon on Tuesday as planned  RTC in 3 months to follow up on DM 2 BAITY, REGINA, NP

## 2017-06-21 NOTE — Patient Instructions (Signed)
Exercising to Lose Weight Exercising can help you to lose weight. In order to lose weight through exercise, you need to do vigorous-intensity exercise. You can tell that you are exercising with vigorous intensity if you are breathing very hard and fast and cannot hold a conversation while exercising. Moderate-intensity exercise helps to maintain your current weight. You can tell that you are exercising at a moderate level if you have a higher heart rate and faster breathing, but you are still able to hold a conversation. How often should I exercise? Choose an activity that you enjoy and set realistic goals. Your health care provider can help you to make an activity plan that works for you. Exercise regularly as directed by your health care provider. This may include:  Doing resistance training twice each week, such as: ? Push-ups. ? Sit-ups. ? Lifting weights. ? Using resistance bands.  Doing a given intensity of exercise for a given amount of time. Choose from these options: ? 150 minutes of moderate-intensity exercise every week. ? 75 minutes of vigorous-intensity exercise every week. ? A mix of moderate-intensity and vigorous-intensity exercise every week.  Children, pregnant women, people who are out of shape, people who are overweight, and older adults may need to consult a health care provider for individual recommendations. If you have any sort of medical condition, be sure to consult your health care provider before starting a new exercise program. What are some activities that can help me to lose weight?  Walking at a rate of at least 4.5 miles an hour.  Jogging or running at a rate of 5 miles per hour.  Biking at a rate of at least 10 miles per hour.  Lap swimming.  Roller-skating or in-line skating.  Cross-country skiing.  Vigorous competitive sports, such as football, basketball, and soccer.  Jumping rope.  Aerobic dancing. How can I be more active in my day-to-day  activities?  Use the stairs instead of the elevator.  Take a walk during your lunch break.  If you drive, park your car farther away from work or school.  If you take public transportation, get off one stop early and walk the rest of the way.  Make all of your phone calls while standing up and walking around.  Get up, stretch, and walk around every 30 minutes throughout the day. What guidelines should I follow while exercising?  Do not exercise so much that you hurt yourself, feel dizzy, or get very short of breath.  Consult your health care provider prior to starting a new exercise program.  Wear comfortable clothes and shoes with good support.  Drink plenty of water while you exercise to prevent dehydration or heat stroke. Body water is lost during exercise and must be replaced.  Work out until you breathe faster and your heart beats faster. This information is not intended to replace advice given to you by your health care provider. Make sure you discuss any questions you have with your health care provider. Document Released: 09/23/2010 Document Revised: 01/27/2016 Document Reviewed: 01/22/2014 Elsevier Interactive Patient Education  2018 [employer].  

## 2017-06-26 ENCOUNTER — Encounter: Payer: BLUE CROSS/BLUE SHIELD | Attending: General Surgery | Admitting: Skilled Nursing Facility1

## 2017-06-26 DIAGNOSIS — Z713 Dietary counseling and surveillance: Secondary | ICD-10-CM | POA: Insufficient documentation

## 2017-06-26 DIAGNOSIS — E119 Type 2 diabetes mellitus without complications: Secondary | ICD-10-CM

## 2017-06-28 ENCOUNTER — Encounter: Payer: Self-pay | Admitting: Skilled Nursing Facility1

## 2017-06-28 NOTE — Progress Notes (Signed)
Bariatric Class:  Appt start time: 1530 end time:  1630.  2 Week Post-Operative Nutrition Class  Patient was seen on 06/28/2017 for Post-Operative Nutrition education at [facility].   Pt states he has been waling on the treadmill for 30 minutes 2 times a week.   Surgery date: 06/11/2017 Surgery type: Sleeve Start weight at [facility]: 357 Weight today: 326.3  TANITA  BODY COMP RESULTS  Pt declined   BMI (kg/m^2)    Fat Mass (lbs)    Fat Free Mass (lbs)    Total Body Water (lbs)    The following the learning objectives were met by the patient during this course:  Identifies Phase 3A (Soft, High Proteins) Dietary Goals and will begin from 2 weeks post-operatively to 2 months post-operatively  Identifies appropriate sources of fluids and proteins   States protein recommendations and appropriate sources post-operatively  Identifies the need for appropriate texture modifications, mastication, and bite sizes when consuming solids  Identifies appropriate multivitamin and calcium sources post-operatively  Describes the need for physical activity post-operatively and will follow MD recommendations  States when to call healthcare provider regarding medication questions or post-operative complications  Handouts given during class include:  Phase 3A: Soft, High Protein Diet Handout  Follow-Up Plan: Patient will follow-up at [facility] in 6 weeks for 2 month post-op nutrition visit for diet advancement per MD.    

## 2017-08-07 ENCOUNTER — Encounter: Payer: Self-pay | Admitting: Skilled Nursing Facility1

## 2017-08-07 ENCOUNTER — Encounter: Payer: BLUE CROSS/BLUE SHIELD | Attending: General Surgery | Admitting: Skilled Nursing Facility1

## 2017-08-07 DIAGNOSIS — Z713 Dietary counseling and surveillance: Secondary | ICD-10-CM | POA: Insufficient documentation

## 2017-08-07 DIAGNOSIS — E119 Type 2 diabetes mellitus without complications: Secondary | ICD-10-CM

## 2017-08-07 NOTE — Progress Notes (Signed)
Follow-up visit:  8 Weeks Post-Operative sleeve Surgery Primary concerns today: Post-operative Bariatric Surgery Nutrition Management.  Pt states he does know the symptoms of hypoglycemia. Pt states he has been feeling a burning sensation in his thigh muscle having started about 1.5 weeks ago. Pt states he is still taking blood pressure medication. Pt states he really likes the protein shakes.   Surgery date: 06/11/2017 Surgery type: Sleeve Start weight at [facility]: 357 Weight today: 326.3 Weight Change: 17.5  TANITA  BODY COMP RESULTS  08/07/2017   BMI (kg/m^2) 49.8   Fat Mass (lbs) 176.6   Fat Free Mass (lbs) 132.2   Total Body Water (lbs) N/A    24-hr recall: B (AM): protein shake Snk (AM):  fairlife milk L (12-2PM): grilled chicken or peppered steak or tuna packets or lunch meat and cheese or fish fillet or protein shake Snk (PM):  D (PM): grilled chicken or meat loaf or steak Snk (PM):   Fluid intake: water, fairlife milk, powerade zero: 48 ounces  Estimated total protein intake: 80+  Medications: See List Supplementation: celebrate and calcium   CBG monitoring: 2 times a day Average CBG per patient: 74-131 Last patient reported A1c:   Using straws: no Drinking while eating: no Having you been chewing well: yes Chewing/swallowing difficulties: no Changes in vision: no Changes to mood/headaches: no Hair loss/Cahnges to skin/Changes to nails: no Any difficulty focusing or concentrating: no Sweating: no Dizziness/Lightheaded: no Palpitations: no  Carbonated beverages: no N/V/D/C/GAS: no Abdominal Pain: no Dumping syndrome: no  Recent physical activity:  5 days a week 45 minute walk and 2 days a week in gym  Progress Towards Goal(s):  In progress.  Handouts given during visit include:  Non-starchy veggies + protein    Nutritional Diagnosis:  Perkins-3.3 Overweight/obesity related to past poor dietary habits and physical inactivity as evidenced by patient w/ recent  sleeve surgery following dietary guidelines for continued weight loss.    Intervention:  Nutrition counseling. Dietitian educated the pt on advancing his diet to include non-starchy vegetables  Goals: -Aim for 64 fluid ounces of fluid -Try the kefir  -Talk to your doctor about your thigh burning   Teaching Method Utilized:  Visual Auditory Hands on  Barriers to learning/adherence to lifestyle change: none identified   Demonstrated degree of understanding via:  Teach Back   Monitoring/Evaluation:  Dietary intake, exercise, and body weight.

## 2017-08-07 NOTE — Patient Instructions (Addendum)
-  Aim for 64 fluid ounces of fluid  -Try the kefir   -Talk to your doctor about your thigh burning

## 2017-08-17 ENCOUNTER — Telehealth: Payer: Self-pay | Admitting: Internal Medicine

## 2017-08-17 ENCOUNTER — Encounter: Payer: Self-pay | Admitting: Internal Medicine

## 2017-08-17 NOTE — Telephone Encounter (Signed)
Pt has appointment for cpx with you on 12/26 that needed to be rescheduled.  Pt stated he needs to have cpx before end of year can pt be worked in? Thanks

## 2017-08-17 NOTE — Telephone Encounter (Signed)
Appointment 12/21 @ 3:45 pt  [place]

## 2017-08-17 NOTE — Telephone Encounter (Signed)
The only day I can work him in is 12/21

## 2017-08-23 ENCOUNTER — Other Ambulatory Visit: Payer: Self-pay | Admitting: Internal Medicine

## 2017-08-23 DIAGNOSIS — I10 Essential (primary) hypertension: Secondary | ICD-10-CM

## 2017-08-24 ENCOUNTER — Encounter: Payer: Self-pay | Admitting: Internal Medicine

## 2017-08-24 ENCOUNTER — Ambulatory Visit (INDEPENDENT_AMBULATORY_CARE_PROVIDER_SITE_OTHER): Payer: BLUE CROSS/BLUE SHIELD | Admitting: Internal Medicine

## 2017-08-24 VITALS — BP 124/80 | HR 80 | Temp 98.3°F | Ht 66.0 in | Wt 303.0 lb

## 2017-08-24 DIAGNOSIS — Z125 Encounter for screening for malignant neoplasm of prostate: Secondary | ICD-10-CM

## 2017-08-24 DIAGNOSIS — M170 Bilateral primary osteoarthritis of knee: Secondary | ICD-10-CM | POA: Diagnosis not present

## 2017-08-24 DIAGNOSIS — Z9989 Dependence on other enabling machines and devices: Secondary | ICD-10-CM

## 2017-08-24 DIAGNOSIS — Z0000 Encounter for general adult medical examination without abnormal findings: Secondary | ICD-10-CM | POA: Diagnosis not present

## 2017-08-24 DIAGNOSIS — M1990 Unspecified osteoarthritis, unspecified site: Secondary | ICD-10-CM | POA: Insufficient documentation

## 2017-08-24 DIAGNOSIS — I10 Essential (primary) hypertension: Secondary | ICD-10-CM

## 2017-08-24 DIAGNOSIS — E119 Type 2 diabetes mellitus without complications: Secondary | ICD-10-CM | POA: Diagnosis not present

## 2017-08-24 DIAGNOSIS — G4733 Obstructive sleep apnea (adult) (pediatric): Secondary | ICD-10-CM | POA: Diagnosis not present

## 2017-08-24 MED ORDER — LOSARTAN POTASSIUM-HCTZ 100-25 MG PO TABS: 1.0000 | ORAL_TABLET | Freq: Every day | ORAL | 2 refills | Status: DC

## 2017-08-24 MED ORDER — GLIPIZIDE 10 MG PO TABS: 10.0000 mg | ORAL_TABLET | Freq: Every day | ORAL | 2 refills | Status: DC

## 2017-08-24 NOTE — Assessment & Plan Note (Signed)
A1C ordered today No microalbumin needed due to ARB therapy Advised him to consume a low carb diet and exercise for weight loss Continue Glipizide, will d/c Januvia Foot exam today Advised him to make an appt for an eye exam He declines flu and pneumovax today

## 2017-08-24 NOTE — Patient Instructions (Signed)

## 2017-08-24 NOTE — Assessment & Plan Note (Signed)
S/p gastric sleeve Congratulated him on his 50 lb weight loss Encouraged him to continue to work on diet and exercise

## 2017-08-24 NOTE — Assessment & Plan Note (Signed)
Controlled on Losartan HCT Will likely need to wean this in the near future CBC and CMET ordered today Reinforced DASH diet and exercise for weight loss

## 2017-08-24 NOTE — Progress Notes (Signed)
Subjective:    Patient ID: Devon Rios, male    DOB: 1966/08/31, 51 y.o.   MRN: [id number]  HPI  Pt presents to the clinic today for his annual exam. He is also due to follow up chronic conditions.  Arthritis: Mainly in his knees. He reports his knees give out on him from time to time. He takes Tylenol as needed with good relief. He is following with orthopedics as well.  DM 2: His last A1C was. 7.3, 06/2017. His sugars range 72-137. He is taking Glipizide 10 mg dailly as prescribed. The Januvia was stopped. He denies hypoglycemia. He does not check his feet routinely. His last eye exam was about 3 years ago.  Frequent Headaches: Resolved with the CPAP use.  HTN: His BP today is 124/80. He is taking Losartan HCT as prescribed. ECG from 11/2016 reviewed.  OSA: He averages 6 hours of sleep per night with the use of the CPAP. He does feel rested when he wakes up.  Morbid Obesity: s/p gastric sleeve. He is down 50 lbs. They have put him on Pantoprazole. He continues to follow with [facility] surgery.  Flu: never Pneumovax: never Tetanus: 2009 Colon Screening: 2010 Vision Screening: as needed Dentist: as needed  Diet: He does eat meat. He is consuming veggies, no fruits. He is not eating any fried foods. He is drink mostly water and Poweraid Zero Exercise: He is exercising 1 hour daily, 7 days per week.  Review of Systems      Past Medical History:  Diagnosis Date  . Arthritis    oa left knee worse than right  . DM2 (diabetes mellitus, type 2) ([place]) 07/21/2014  . Frequent headaches    weekly  . Hypertension   . OSA on CPAP 2013  . Severe obesity (BMI >= 40) ([place]) 07/14/2014    Current Outpatient Medications  Medication Sig Dispense Refill  . Blood Glucose Monitoring Suppl (BAYER CONTOUR MONITOR) w/Device KIT Check blood sugar twice a day and as directed Dx E11.9 1 kit 0  . Calcium 500 MG CHEW Chew 1 each by mouth 3 (three) times daily.    Marland Kitchen glipiZIDE (GLUCOTROL)  10 MG tablet TAKE 1 TABLET BY MOUTH TWICE DAILY BEFORE A  MEAL. (Patient taking differently: TAKE 1 TABLET BY MOUTH DAILY) 180 tablet 0  . glucose blood (BAYER CONTOUR TEST) test strip Check blood sugar twice a day and as directed Dx E11.9 100 each 5  . losartan-hydrochlorothiazide (HYZAAR) 100-25 MG tablet TAKE 1 TABLET BY MOUTH ONCE DAILY 90 tablet 0  . Multiple Vitamins-Minerals (CELEBRATE MULTI-COMPLETE 45) CHEW Chew 1 tablet by mouth 2 (two) times daily.    . Omega-3 Fatty Acids (FISH OIL) 1000 MG CAPS Take 1,000 mg by mouth daily.     . ondansetron (ZOFRAN) 4 MG tablet Take 4 mg by mouth every 6 (six) hours as needed for nausea or vomiting.    [name] DELICA LANCETS 92E MISC 1 each by Does not apply route 2 (two) times daily. Dx E11.9 100 each 6  . pantoprazole (PROTONIX) 40 MG tablet Take 40 mg by mouth daily.    . sitaGLIPtin (JANUVIA) 50 MG tablet Take 1 tablet (50 mg total) by mouth daily. 90 tablet 1   No current facility-administered medications for this visit.     No Known Allergies  Family History  Problem Relation Age of Onset  . Hypertension Mother   . Diabetes Mother   . Cancer Maternal Grandmother  Lung  . Stroke Neg Hx     Social History   Socioeconomic History  . Marital status: Married    Spouse name: Not on file  . Number of children: Not on file  . Years of education: Not on file  . Highest education level: Not on file  Social Needs  . Financial resource strain: Not on file  . Food insecurity - worry: Not on file  . Food insecurity - inability: Not on file  . Transportation needs - medical: Not on file  . Transportation needs - non-medical: Not on file  Occupational History  . Not on file  Tobacco Use  . Smoking status: Never Smoker  . Smokeless tobacco: Never Used  Substance and Sexual Activity  . Alcohol use: Yes    Alcohol/week: 0.0 oz    Comment: rare  . Drug use: No  . Sexual activity: Yes  Other Topics Concern  . Not on file    Social History Narrative  . Not on file     Constitutional: Pt reports weight loss. Denies fever, malaise, fatigue, headache.  HEENT: Denies eye pain, eye redness, ear pain, ringing in the ears, wax buildup, runny nose, nasal congestion, bloody nose, or sore throat. Respiratory: Denies difficulty breathing, shortness of breath, cough or sputum production.   Cardiovascular: Denies chest pain, chest tightness, palpitations or swelling in the hands or feet.  Gastrointestinal: Denies abdominal pain, bloating, constipation, diarrhea or blood in the stool.  GU: Denies urgency, frequency, pain with urination, burning sensation, blood in urine, odor or discharge. Musculoskeletal: Pt reports joint pain in knees. Denies decrease in range of motion, difficulty with gait, muscle pain or joint swelling.  Skin: Denies redness, rashes, lesions or ulcercations.  Neurological: Denies dizziness, difficulty with memory, difficulty with speech or problems with balance and coordination.  Psych: Denies anxiety, depression, SI/HI.  No other specific complaints in a complete review of systems (except as listed in HPI above).  Objective:   Physical Exam  BP 124/80   Pulse 80   Temp 98.3 F (36.8 C) (Oral)   Ht 5' 6" (1.676 m)   Wt (!) 303 lb (137.4 kg)   SpO2 98%   BMI 48.91 kg/m  Wt Readings from Last 3 Encounters:  08/24/17 (!) 303 lb (137.4 kg)  08/07/17 (!) 308 lb 12.8 oz (140.1 kg)  06/28/17 (!) 326 lb 4.8 oz (148 kg)    General: Appears his stated age, obese in NAD. Skin: Warm, dry and intact. Acanthos nigracans noted. HEENT: Head: normal shape and size; Eyes: sclera white, no icterus, conjunctiva pink, PERRLA and EOMs intact; Ears: Tm's gray and intact, normal light reflex; Throat/Mouth: Teeth present, mucosa pink and moist, no exudate, lesions or ulcerations noted.  Neck:  Neck supple, trachea midline. No masses, lumps or thyromegaly present.  Cardiovascular: Normal rate and rhythm. S1,S2  noted.  No murmur, rubs or gallops noted. No JVD or BLE edema. No carotid bruits noted. Pulmonary/Chest: Normal effort and positive vesicular breath sounds. No respiratory distress. No wheezes, rales or ronchi noted.  Abdomen: Soft and nontender. Normal bowel sounds. No distention or masses noted. Liver, spleen and kidneys non palpable. Musculoskeletal: Strength 5/5 BUE/BLE. No difficulty with gait.  Neurological: Alert and oriented. Cranial nerves II-XII grossly intact. Coordination normal.  Psychiatric: Mood and affect normal. Behavior is normal. Judgment and thought content normal.  BMET    Component Value Date/Time   NA 139 06/08/2017 0854   K 4.4 06/08/2017 0854  CL 104 06/08/2017 0854   CO2 27 06/08/2017 0854   GLUCOSE 113 (H) 06/08/2017 0854   BUN 22 (H) 06/08/2017 0854   CREATININE 1.60 (H) 06/11/2017 1801   CALCIUM 9.8 06/08/2017 0854   GFRNONAA 49 (L) 06/11/2017 1801   GFRAA 56 (L) 06/11/2017 1801    Lipid Panel     Component Value Date/Time   CHOL 142 07/03/2016 1526   TRIG 133.0 07/03/2016 1526   HDL 40.00 07/03/2016 1526   CHOLHDL 4 07/03/2016 1526   VLDL 26.6 07/03/2016 1526   LDLCALC 76 07/03/2016 1526    CBC    Component Value Date/Time   WBC 8.7 06/12/2017 0552   RBC 5.09 06/12/2017 0552   HGB 14.1 06/12/2017 0552   HCT 43.5 06/12/2017 0552   PLT 185 06/12/2017 0552   MCV 85.5 06/12/2017 0552   MCH 27.7 06/12/2017 0552   MCHC 32.4 06/12/2017 0552   RDW 15.0 06/12/2017 0552   LYMPHSABS 1.2 06/12/2017 0552   MONOABS 0.4 06/12/2017 0552   EOSABS 0.0 06/12/2017 0552   BASOSABS 0.0 06/12/2017 0552    Hgb A1C Lab Results  Component Value Date   HGBA1C 7.3 (H) 06/08/2017            Assessment & Plan:   Preventative Health Maintenance:  He declines flu or pneumonax Tetanus due 2019 Colon screening due 2020 Encouraged him to consume a balanced diet and exercise regimen Advised him to see an eye doctor and dentist annually He will return  after 09/08/17 for labs  RTC in 6 months, follow up chronic conditions noted Webb Silversmith, NP

## 2017-08-24 NOTE — Assessment & Plan Note (Signed)
He will continue to wear CPAP He is actively working on weight loss at this time

## 2017-08-24 NOTE — Assessment & Plan Note (Signed)
Improving with weight loss He will continue Tylenol prn

## 2017-08-29 ENCOUNTER — Encounter: Payer: BLUE CROSS/BLUE SHIELD | Admitting: Internal Medicine

## 2017-09-05 ENCOUNTER — Other Ambulatory Visit: Payer: Self-pay | Admitting: Internal Medicine

## 2017-09-10 ENCOUNTER — Other Ambulatory Visit (INDEPENDENT_AMBULATORY_CARE_PROVIDER_SITE_OTHER): Payer: PRIVATE HEALTH INSURANCE

## 2017-09-10 DIAGNOSIS — E119 Type 2 diabetes mellitus without complications: Secondary | ICD-10-CM

## 2017-09-10 DIAGNOSIS — Z125 Encounter for screening for malignant neoplasm of prostate: Secondary | ICD-10-CM | POA: Diagnosis not present

## 2017-09-10 LAB — LIPID PANEL
CHOLESTEROL: 119 mg/dL (ref 0–200)
HDL: 39.4 mg/dL (ref >39.00)
LDL Cholesterol: 66 mg/dL (ref 0–99)
NonHDL: 79.11
TRIGLYCERIDES: 66 mg/dL (ref 0.0–149.0)
Total CHOL/HDL Ratio: 3
VLDL: 13.2 mg/dL (ref 0.0–40.0)

## 2017-09-10 LAB — HEMOGLOBIN A1C: Hgb A1c MFr Bld: 5.9 % (ref 4.6–6.5)

## 2017-09-10 LAB — CBC WITH DIFFERENTIAL/PLATELET
BASOS PCT: 1 % (ref 0.0–3.0)
Basophils Absolute: 0 10*3/uL (ref 0.0–0.1)
EOS PCT: 3.3 % (ref 0.0–5.0)
Eosinophils Absolute: 0.2 10*3/uL (ref 0.0–0.7)
HCT: 42.8 % (ref 39.0–52.0)
Hemoglobin: 13.8 g/dL (ref 13.0–17.0)
LYMPHS ABS: 2.1 10*3/uL (ref 0.7–4.0)
Lymphocytes Relative: 43.6 % (ref 12.0–46.0)
MCHC: 32.2 g/dL (ref 30.0–36.0)
MCV: 86.6 fl (ref 78.0–100.0)
MONOS PCT: 8.3 % (ref 3.0–12.0)
Monocytes Absolute: 0.4 10*3/uL (ref 0.1–1.0)
NEUTROS ABS: 2.1 10*3/uL (ref 1.4–7.7)
NEUTROS PCT: 43.8 % (ref 43.0–77.0)
PLATELETS: 200 10*3/uL (ref 150.0–400.0)
RBC: 4.95 Mil/uL (ref 4.22–5.81)
RDW: 15.9 % — ABNORMAL HIGH (ref 11.5–15.5)
WBC: 4.9 10*3/uL (ref 4.0–10.5)

## 2017-09-10 LAB — COMPREHENSIVE METABOLIC PANEL
ALK PHOS: 61 U/L (ref 39–117)
ALT: 23 U/L (ref 0–53)
AST: 19 U/L (ref 0–37)
Albumin: 4.4 g/dL (ref 3.5–5.2)
BUN: 29 mg/dL — ABNORMAL HIGH (ref 6–23)
CO2: 30 meq/L (ref 19–32)
Calcium: 10 mg/dL (ref 8.4–10.5)
Chloride: 101 mEq/L (ref 96–112)
Creatinine, Ser: 1.62 mg/dL — ABNORMAL HIGH (ref 0.40–1.50)
GFR: 58.01 mL/min — ABNORMAL LOW (ref >60.00)
GLUCOSE: 115 mg/dL — AB (ref 70–99)
POTASSIUM: 4.1 meq/L (ref 3.5–5.1)
Sodium: 140 mEq/L (ref 135–145)
TOTAL PROTEIN: 7.3 g/dL (ref 6.0–8.3)
Total Bilirubin: 0.6 mg/dL (ref 0.2–1.2)

## 2017-09-10 LAB — PSA: PSA: 0.66 ng/mL (ref 0.10–4.00)

## 2017-09-19 MED ORDER — GLIPIZIDE 5 MG PO TABS: 5.0000 mg | ORAL_TABLET | Freq: Two times a day (BID) | ORAL | 2 refills | Status: DC

## 2017-09-19 NOTE — Addendum Note (Signed)
Addended by: Lurlean Nanny on: 09/19/2017 11:35 AM   Modules accepted: Orders

## 2017-11-06 ENCOUNTER — Encounter: Payer: Self-pay | Admitting: Skilled Nursing Facility1

## 2017-11-06 ENCOUNTER — Encounter: Payer: 59 | Attending: General Surgery | Admitting: Skilled Nursing Facility1

## 2017-11-06 DIAGNOSIS — Z713 Dietary counseling and surveillance: Secondary | ICD-10-CM | POA: Insufficient documentation

## 2017-11-06 DIAGNOSIS — E119 Type 2 diabetes mellitus without complications: Secondary | ICD-10-CM

## 2017-11-06 NOTE — Progress Notes (Signed)
Post-Operative sleeve Surgery Primary concerns today: Post-operative Bariatric Surgery Nutrition Management.  Pt states he is now off the blood pressure medication. Pt states he is still taking glipizide. Pt states he wants to lose 100 pounds by June. Pt states he eats Candy here and there. Pt states he is very happy and feels very good.   Surgery date: 06/11/2017 Surgery type: Sleeve Start weight at [facility]: 357 Weight today: 279.8 Weight Change: 46.5  TANITA  BODY COMP RESULTS  08/07/2017 11/06/2017   BMI (kg/m^2) 49.8 45.2   Fat Mass (lbs) 176.6 124   Fat Free Mass (lbs) 132.2 155.8   Total Body Water (lbs) N/A 125.2    24-hr recall: B (AM): protein shake or grilled chicken and egg and cheese english muffin or p3 protein with hard boiled egg Snk (AM):  Protein shake L (12-2PM):  2 ounces of tuna and 4 crackers Snk (PM): sugar free popcicle or peanut butter or hard boiled egg or cheese or almonds or p3 protein D (PM): lemon chicken and squash  Snk (PM): pure protein bar  Fluid intake: water, fairlife milk, powerade zero: 60-75 ounces  Estimated total protein intake: 80+  Medications: See List Supplementation: celebrate and calcium   CBG monitoring: 2 times a day Average CBG per patient: 95, 109 Last patient reported A1c: 5.7  Using straws: no Drinking while eating: no Having you been chewing well: yes Chewing/swallowing difficulties: no Changes in vision: no Changes to mood/headaches: no Hair loss/Cahnges to skin/Changes to nails: no Any difficulty focusing or concentrating: no Sweating: no Dizziness/Lightheaded: no Palpitations: no  Carbonated beverages: no N/V/D/C/GAS: no Abdominal Pain: no Dumping syndrome: no  Recent physical activity:  60 minutes in the morning(walking) and 60 minutes (walking) in the evening 7 days a week; weights 2 days a week in the gym  Progress Towards Goal(s):  In progress.  Handouts given during visit include:  Non-starchy veggies  + protein    Nutritional Diagnosis:  [place]-3.3 Overweight/obesity related to past poor dietary habits and physical inactivity as evidenced by patient w/ recent sleeve surgery following dietary guidelines for continued weight loss.    Intervention:  Nutrition counseling. Dietitian educated the pt on advancing his diet to include non-starchy vegetables  Goals: -Keep up the great work! -You are okay to add fruit back in  Teaching Method Utilized:  Visual Auditory Hands on  Barriers to learning/adherence to lifestyle change: none identified   Demonstrated degree of understanding via:  Teach Back   Monitoring/Evaluation:  Dietary intake, exercise, and body weight.

## 2018-02-21 ENCOUNTER — Encounter: Payer: Self-pay | Admitting: Internal Medicine

## 2018-03-21 ENCOUNTER — Ambulatory Visit: Payer: PRIVATE HEALTH INSURANCE | Admitting: Internal Medicine

## 2018-03-21 ENCOUNTER — Encounter: Payer: Self-pay | Admitting: Internal Medicine

## 2018-03-21 VITALS — BP 120/84 | HR 62 | Temp 98.0°F | Wt 250.0 lb

## 2018-03-21 DIAGNOSIS — G4733 Obstructive sleep apnea (adult) (pediatric): Secondary | ICD-10-CM

## 2018-03-21 DIAGNOSIS — E119 Type 2 diabetes mellitus without complications: Secondary | ICD-10-CM

## 2018-03-21 DIAGNOSIS — M170 Bilateral primary osteoarthritis of knee: Secondary | ICD-10-CM | POA: Diagnosis not present

## 2018-03-21 DIAGNOSIS — I10 Essential (primary) hypertension: Secondary | ICD-10-CM | POA: Diagnosis not present

## 2018-03-21 DIAGNOSIS — Z9989 Dependence on other enabling machines and devices: Secondary | ICD-10-CM | POA: Diagnosis not present

## 2018-03-21 NOTE — Assessment & Plan Note (Signed)
Now off meds A1C today Encouraged low carb diet and continued exercise for weight loss Encouraged yearly eye exams Foot exam today

## 2018-03-21 NOTE — Assessment & Plan Note (Signed)
Now controlled off meds CMET today Reinforced DASH diet and exercise for continued weight loss

## 2018-03-21 NOTE — Assessment & Plan Note (Signed)
Down 103 lbs Encouraged continue balanced diet and exercise for weight loss

## 2018-03-21 NOTE — Patient Instructions (Signed)
Exercising to Lose Weight Exercising can help you to lose weight. In order to lose weight through exercise, you need to do vigorous-intensity exercise. You can tell that you are exercising with vigorous intensity if you are breathing very hard and fast and cannot hold a conversation while exercising. Moderate-intensity exercise helps to maintain your current weight. You can tell that you are exercising at a moderate level if you have a higher heart rate and faster breathing, but you are still able to hold a conversation. How often should I exercise? Choose an activity that you enjoy and set realistic goals. Your health care provider can help you to make an activity plan that works for you. Exercise regularly as directed by your health care provider. This may include:  Doing resistance training twice each week, such as: ? Push-ups. ? Sit-ups. ? Lifting weights. ? Using resistance bands.  Doing a given intensity of exercise for a given amount of time. Choose from these options: ? 150 minutes of moderate-intensity exercise every week. ? 75 minutes of vigorous-intensity exercise every week. ? A mix of moderate-intensity and vigorous-intensity exercise every week.  Children, pregnant women, people who are out of shape, people who are overweight, and older adults may need to consult a health care provider for individual recommendations. If you have any sort of medical condition, be sure to consult your health care provider before starting a new exercise program. What are some activities that can help me to lose weight?  Walking at a rate of at least 4.5 miles an hour.  Jogging or running at a rate of 5 miles per hour.  Biking at a rate of at least 10 miles per hour.  Lap swimming.  Roller-skating or in-line skating.  Cross-country skiing.  Vigorous competitive sports, such as football, basketball, and soccer.  Jumping rope.  Aerobic dancing. How can I be more active in my day-to-day  activities?  Use the stairs instead of the elevator.  Take a walk during your lunch break.  If you drive, park your car farther away from work or school.  If you take public transportation, get off one stop early and walk the rest of the way.  Make all of your phone calls while standing up and walking around.  Get up, stretch, and walk around every 30 minutes throughout the day. What guidelines should I follow while exercising?  Do not exercise so much that you hurt yourself, feel dizzy, or get very short of breath.  Consult your health care provider prior to starting a new exercise program.  Wear comfortable clothes and shoes with good support.  Drink plenty of water while you exercise to prevent dehydration or heat stroke. Body water is lost during exercise and must be replaced.  Work out until you breathe faster and your heart beats faster. This information is not intended to replace advice given to you by your health care provider. Make sure you discuss any questions you have with your health care provider. Document Released: 09/23/2010 Document Revised: 01/27/2016 Document Reviewed: 01/22/2014 Elsevier Interactive Patient Education  2018 [employer].  

## 2018-03-21 NOTE — Assessment & Plan Note (Signed)
Improved with weight loss Encouraged regular activity

## 2018-03-21 NOTE — Assessment & Plan Note (Signed)
Continue to work on weight loss Continue CPAP

## 2018-03-21 NOTE — Progress Notes (Signed)
Subjective:    Patient ID: Devon Rios, male    DOB: 1966-03-14, 52 y.o.   MRN: [id number]  HPI  Pt presents to the clinic today to follow up chronic conditions. He had a sleeve gastrectomy 06/2017. He has lost a total of 103 lbs.   HTN: His BP today is 120/84. He stopped taking Losartan HCT 1 week ago. ECG from 11/2016 reviewed.  GERD: Triggered by his weight and improved with weight loss. He no longer takes Pantoprazole.   OSA on CPAP: Sleep study from 04/2011 reviewed. He is following with pulmonology.  DM 2: His last A1C was 5.9%, 09/2017. His Glipizide was decreased to 5 mg BID at that time but he reports he stopped it all together 3 weeks ago. His sugars range. He checks his feet intermittently. He does not take pneumovax.   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 Much improved with weight loss.   Review of Systems      Past Medical History:  Diagnosis Date  . Arthritis    oa left knee worse than right  . DM2 (diabetes mellitus, type 2) ([place]) 07/21/2014  . Frequent headaches    weekly  . Hypertension   . OSA on CPAP 2013  . Severe obesity (BMI >= 40) ([employer]) 07/14/2014    Current Outpatient Medications  Medication Sig Dispense Refill  . Blood Glucose Monitoring Suppl (BAYER CONTOUR MONITOR) w/Device KIT Check blood sugar twice a day and as directed Dx E11.9 1 kit 0  . Calcium 500 MG CHEW Chew 1 each by mouth 3 (three) times daily.    Marland Kitchen glipiZIDE (GLUCOTROL) 5 MG tablet Take 1 tablet (5 mg total) by mouth 2 (two) times daily before a meal. 30 tablet 2  . glucose blood (BAYER CONTOUR TEST) test strip Check blood sugar twice a day and as directed Dx E11.9 100 each 5  . losartan-hydrochlorothiazide (HYZAAR) 100-25 MG tablet Take 1 tablet by mouth daily. 30 tablet 2  . Multiple Vitamins-Minerals (CELEBRATE MULTI-COMPLETE 45) CHEW Chew 1 tablet by mouth 2 (two) times daily.    . Omega-3 Fatty Acids (FISH OIL) 1000 MG CAPS Take 1,000 mg by mouth daily.     . ondansetron (ZOFRAN) 4 MG tablet  Take 4 mg by mouth every 6 (six) hours as needed for nausea or vomiting.    [name] DELICA LANCETS 70W MISC 1 each by Does not apply route 2 (two) times daily. Dx E11.9 100 each 6  . pantoprazole (PROTONIX) 40 MG tablet Take 40 mg by mouth daily.     No current facility-administered medications for this visit.     No Known Allergies  Family History  Problem Relation Age of Onset  . Hypertension Mother   . Diabetes Mother   . Cancer Maternal Grandmother        Lung  . Stroke Neg Hx     Social History   Socioeconomic History  . Marital status: Married    Spouse name: Not on file  . Number of children: Not on file  . Years of education: Not on file  . Highest education level: Not on file  Occupational History  . Not on file  Social Needs  . Financial resource strain: Not on file  . Food insecurity:    Worry: Not on file    Inability: Not on file  . Transportation needs:    Medical: Not on file    Non-medical: Not on file  Tobacco Use  . Smoking status:  Never Smoker  . Smokeless tobacco: Never Used  Substance and Sexual Activity  . Alcohol use: Yes    Alcohol/week: 0.0 oz    Comment: rare  . Drug use: No  . Sexual activity: Yes  Lifestyle  . Physical activity:    Days per week: Not on file    Minutes per session: Not on file  . Stress: Not on file  Relationships  . Social connections:    Talks on phone: Not on file    Gets together: Not on file    Attends religious service: Not on file    Active member of club or organization: Not on file    Attends meetings of clubs or organizations: Not on file    Relationship status: Not on file  . Intimate partner violence:    Fear of current or ex partner: Not on file    Emotionally abused: Not on file    Physically abused: Not on file    Forced sexual activity: Not on file  Other Topics Concern  . Not on file  Social History Narrative  . Not on file     Constitutional: Denies fever, malaise, fatigue, headache  or abrupt weight changes.  Respiratory: Denies difficulty breathing, shortness of breath, cough or sputum production.   Cardiovascular: Denies chest pain, chest tightness, palpitations or swelling in the hands or feet.  Gastrointestinal: Denies abdominal pain, bloating, constipation, diarrhea or blood in the stool.  Musculoskeletal: Pt reports intermittent knee pain. Denies decrease in range of motion, difficulty with gait, muscle pain or joint swelling.  Skin: Denies redness, rashes, lesions or ulcercations.  Neurological: Denies dizziness, difficulty with memory, difficulty with speech or problems with balance and coordination.    No other specific complaints in a complete review of systems (except as listed in HPI above).  Objective:   Physical Exam   BP 120/84   Pulse 62   Temp 98 F (36.7 C) (Oral)   Wt 250 lb (113.4 kg)   SpO2 98%   BMI 40.35 kg/m  Wt Readings from Last 3 Encounters:  03/21/18 250 lb (113.4 kg)  11/06/17 279 lb 12.8 oz (126.9 kg)  08/24/17 (!) 303 lb (137.4 kg)    General: Appears his stated age, obese in NAD. Skin: Warm, dry and intact. No ulcerations noted. Cardiovascular: Normal rate and rhythm. S1,S2 noted.  No murmur, rubs or gallops noted. No JVD or BLE edema. No carotid bruits noted. Pulmonary/Chest: Normal effort and positive vesicular breath sounds. No respiratory distress. No wheezes, rales or ronchi noted.  Abdomen: Soft and nontender. Normal bowel sounds. No distention or masses noted.  Musculoskeletal:  No difficulty with gait.  Neurological: Alert and oriented. Sensation intact to BLE.   BMET    Component Value Date/Time   NA 140 09/10/2017 0836   K 4.1 09/10/2017 0836   CL 101 09/10/2017 0836   CO2 30 09/10/2017 0836   GLUCOSE 115 (H) 09/10/2017 0836   BUN 29 (H) 09/10/2017 0836   CREATININE 1.62 (H) 09/10/2017 0836   CALCIUM 10.0 09/10/2017 0836   GFRNONAA 49 (L) 06/11/2017 1801   GFRAA 56 (L) 06/11/2017 1801    Lipid Panel       Component Value Date/Time   CHOL 119 09/10/2017 0836   TRIG 66.0 09/10/2017 0836   HDL 39.40 09/10/2017 0836   CHOLHDL 3 09/10/2017 0836   VLDL 13.2 09/10/2017 0836   LDLCALC 66 09/10/2017 0836    CBC    Component Value  Date/Time   WBC 4.9 09/10/2017 0836   RBC 4.95 09/10/2017 0836   HGB 13.8 09/10/2017 0836   HCT 42.8 09/10/2017 0836   PLT 200.0 09/10/2017 0836   MCV 86.6 09/10/2017 0836   MCH 27.7 06/12/2017 0552   MCHC 32.2 09/10/2017 0836   RDW 15.9 (H) 09/10/2017 0836   LYMPHSABS 2.1 09/10/2017 0836   MONOABS 0.4 09/10/2017 0836   EOSABS 0.2 09/10/2017 0836   BASOSABS 0.0 09/10/2017 0836    Hgb A1C Lab Results  Component Value Date   HGBA1C 5.9 09/10/2017           Assessment & Plan:

## 2018-03-22 LAB — COMPREHENSIVE METABOLIC PANEL
ALBUMIN: 4.4 g/dL (ref 3.5–5.2)
ALK PHOS: 67 U/L (ref 39–117)
ALT: 19 U/L (ref 0–53)
AST: 18 U/L (ref 0–37)
BILIRUBIN TOTAL: 0.5 mg/dL (ref 0.2–1.2)
BUN: 24 mg/dL — ABNORMAL HIGH (ref 6–23)
CALCIUM: 9.7 mg/dL (ref 8.4–10.5)
CO2: 31 meq/L (ref 19–32)
CREATININE: 1.24 mg/dL (ref 0.40–1.50)
Chloride: 103 mEq/L (ref 96–112)
GFR: 78.81 mL/min (ref >60.00)
Glucose, Bld: 79 mg/dL (ref 70–99)
Potassium: 4.1 mEq/L (ref 3.5–5.1)
Sodium: 140 mEq/L (ref 135–145)
TOTAL PROTEIN: 7.9 g/dL (ref 6.0–8.3)

## 2018-03-22 LAB — HEMOGLOBIN A1C: HEMOGLOBIN A1C: 5.7 % (ref 4.6–6.5)

## 2018-06-25 ENCOUNTER — Telehealth: Payer: Self-pay | Admitting: Family Medicine

## 2018-07-11 NOTE — Telephone Encounter (Signed)
Note opened in error.

## 2018-07-18 ENCOUNTER — Encounter: Payer: Self-pay | Admitting: Internal Medicine

## 2018-07-31 ENCOUNTER — Encounter

## 2018-09-12 ENCOUNTER — Ambulatory Visit: Payer: PRIVATE HEALTH INSURANCE | Admitting: Internal Medicine

## 2018-09-12 ENCOUNTER — Ambulatory Visit: Payer: PRIVATE HEALTH INSURANCE | Admitting: Family Medicine

## 2018-09-12 ENCOUNTER — Encounter: Payer: Self-pay | Admitting: Family Medicine

## 2018-09-12 VITALS — BP 164/92 | HR 70 | Temp 98.4°F | Ht 67.0 in | Wt 230.2 lb

## 2018-09-12 DIAGNOSIS — I10 Essential (primary) hypertension: Secondary | ICD-10-CM | POA: Diagnosis not present

## 2018-09-12 DIAGNOSIS — R221 Localized swelling, mass and lump, neck: Secondary | ICD-10-CM | POA: Diagnosis not present

## 2018-09-12 DIAGNOSIS — E119 Type 2 diabetes mellitus without complications: Secondary | ICD-10-CM | POA: Diagnosis not present

## 2018-09-12 NOTE — Addendum Note (Signed)
Addended by: Lesleigh Noe on: 09/12/2018 04:44 PM   Modules accepted: Orders

## 2018-09-12 NOTE — Progress Notes (Signed)
Subjective:     Edgerrin Correia is a 53 y.o. male presenting for Knot on back of head (x 1 week. Has increased in size and is tender now. It is uncomfortable to wear CPAP at night due to this. He has applied first aid ointment and cortisone 10 to the area but not relief.)     HPI   #Head lesion - started 1 week ago - getting larger and tender to palpation - uncomfortable to wear CPAP - Tried first aid ointment and cortisone 10 w/o improvement - drainage: no - rash: no - injury: no - hx of ingrown hairs, but this has resolved - not itching - pain with head movement and with pressure to the area  Review of Systems  Constitutional: Negative for chills and fever.  Respiratory: Negative for shortness of breath.   Cardiovascular: Negative for chest pain.     Social History   Tobacco Use  Smoking Status Never Smoker  Smokeless Tobacco Never Used        Objective:    BP Readings from Last 3 Encounters:  09/12/18 (!) 164/92  03/21/18 120/84  08/24/17 124/80   Wt Readings from Last 3 Encounters:  09/12/18 230 lb 4 oz (104.4 kg)  03/21/18 250 lb (113.4 kg)  11/06/17 279 lb 12.8 oz (126.9 kg)    BP (!) 164/92   Pulse 70   Temp 98.4 F (36.9 C)   Ht 5\' 7"  (1.702 m) Comment: per patient  Wt 230 lb 4 oz (104.4 kg)   SpO2 99%   BMI 36.06 kg/m    Physical Exam Constitutional:      Appearance: Normal appearance. He is not ill-appearing or diaphoretic.  HENT:     Right Ear: External ear normal.     Left Ear: External ear normal.     Nose: Nose normal.  Eyes:     General: No scleral icterus.    Extraocular Movements: Extraocular movements intact.     Conjunctiva/sclera: Conjunctivae normal.  Neck:     Musculoskeletal: Neck supple. No neck rigidity.  Cardiovascular:     Rate and Rhythm: Normal rate and regular rhythm.     Heart sounds: No murmur.  Pulmonary:     Effort: Pulmonary effort is normal.     Breath sounds: Normal breath sounds. No rhonchi or rales.    Lymphadenopathy:     Cervical: No cervical adenopathy.  Skin:    General: Skin is warm and dry.     Comments: Posterior neck/occiput area: Large (4-5 inches), firm, mildly mobile mass which is tender to palpation present. No fluctuance. No erythema. There is chronic scaring overlaying the mass.   Neurological:     Mental Status: He is alert. Mental status is at baseline.  Psychiatric:        Mood and Affect: Mood normal.           Assessment & Plan:   Problem List Items Addressed This Visit      Cardiovascular and Mediastinum   Essential hypertension    Pt reports normal values on home monitoring. Suggested home monitoring, avoid caffeine. And PCP follow-up in 4 weeks to recheck        Other   Localized swelling, mass or lump of neck - Primary    Etiology unclear. Does not appear to be abscess given no erythema or fluctuance on exam. Wonder if cyst or benign growth given size increase. Strict return precautions given. CT ordered to evaluate further.  Relevant Orders   CT SOFT TISSUE NECK W CONTRAST   Ambulatory referral to Dermatology       Return in about 4 weeks (around 10/10/2018) for Blood pressure.  Lesleigh Noe, MD

## 2018-09-12 NOTE — Assessment & Plan Note (Signed)
Pt reports normal values on home monitoring. Suggested home monitoring, avoid caffeine. And PCP follow-up in 4 weeks to recheck

## 2018-09-12 NOTE — Patient Instructions (Signed)
Go see the referral coordinator today to try and get your CT scan scheduled.   If fever, chills, worsening pain, return to clinic or go to Urgent care for evaluation.   Check your blood pressure at home. Make an appointment to see your PCP

## 2018-09-12 NOTE — Assessment & Plan Note (Signed)
Etiology unclear. Does not appear to be abscess given no erythema or fluctuance on exam. Wonder if cyst or benign growth given size increase. Strict return precautions given. CT ordered to evaluate further.

## 2018-09-13 ENCOUNTER — Ambulatory Visit
Admission: RE | Admit: 2018-09-13 | Discharge: 2018-09-13 | Disposition: A | Discharge status: Home / Self Care | Payer: PRIVATE HEALTH INSURANCE | Source: Ambulatory Visit | Attending: Family Medicine | Admitting: Family Medicine

## 2018-09-13 ENCOUNTER — Other Ambulatory Visit: Payer: Self-pay | Admitting: Internal Medicine

## 2018-09-13 DIAGNOSIS — R221 Localized swelling, mass and lump, neck: Secondary | ICD-10-CM | POA: Insufficient documentation

## 2018-09-13 LAB — BASIC METABOLIC PANEL
BUN: 23 mg/dL (ref 6–23)
CO2: 30 mEq/L (ref 19–32)
CREATININE: 1.24 mg/dL (ref 0.40–1.50)
Calcium: 10.2 mg/dL (ref 8.4–10.5)
Chloride: 102 mEq/L (ref 96–112)
GFR: 78.66 mL/min (ref >60.00)
Glucose, Bld: 80 mg/dL (ref 70–99)
Potassium: 5.1 mEq/L (ref 3.5–5.1)
Sodium: 139 mEq/L (ref 135–145)

## 2018-09-13 LAB — HEMOGLOBIN A1C: Hgb A1c MFr Bld: 6 % (ref 4.6–6.5)

## 2018-09-13 MED ORDER — SULFAMETHOXAZOLE-TRIMETHOPRIM 800-160 MG PO TABS: 1.0000 | ORAL_TABLET | Freq: Two times a day (BID) | ORAL | 0 refills | Status: DC

## 2018-09-13 MED ORDER — IOPAMIDOL (ISOVUE-300) INJECTION 61%
75.0000 mL | Freq: Once | INTRAVENOUS | Status: AC | PRN
  Administered 2018-09-13: 75 mL via INTRAVENOUS

## 2018-09-13 NOTE — Telephone Encounter (Signed)
Spoke with Junie Panning, spoke with Webb Silversmith, NP. Patient is scheduled to see Webb Silversmith on 09/16/2018 and RX for Septra DS was sent in to the pharmacy. Threasa Beards is advising patient.

## 2018-09-16 ENCOUNTER — Ambulatory Visit: Payer: PRIVATE HEALTH INSURANCE | Admitting: Internal Medicine

## 2018-09-17 ENCOUNTER — Ambulatory Visit: Payer: Self-pay | Admitting: Surgery

## 2018-09-17 NOTE — H&P (Signed)
Subjective:   CC: Scalp abscess [L02.811]  HPI:  Devon Rios is a 53 y.o. male who was referred by Ronn Melena, MD for evaluation of above. First noted 1 week ago.  Symptoms include: Pain is sharp, intermittent.  Exacerbated by nothing specific.  Alleviated by nothing specific.  Associated with increased swelling in area.  Noted some increased drainage yesterday     Past Medical History:  has a past medical history of Diabetes mellitus without complication (CMS-HCC), Hypertension, and Sleep apnea.  Past Surgical History:  has a past surgical history that includes gastric Sleeve (06/07/2017) and Arthroscopy Knee (Right, 2007).  Family History: family history is not on file.  Social History:  reports that he has never smoked. He has never used smokeless tobacco. He reports previous alcohol use. He reports that he does not use drugs.  Current Medications: has a current medication list which includes the following prescription(s): calcium carbonate, doxycycline, multivitamin, and tramadol.  Allergies:  No Known Allergies  ROS:  A 15 point review of systems was performed and pertinent positives and negatives noted in HPI   Objective:   BP (!) 184/96   Pulse 68   Ht 170.2 cm (5' 7.01")   Wt (!) 105.5 kg (232 lb 8 oz)   SpO2 98%   BMI 36.41 kg/m   Constitutional :  alert, appears stated age, cooperative and no distress  Lymphatics/Throat:  no asymmetry, masses, or scars  Respiratory:  clear to auscultation bilaterally  Cardiovascular:  regular rate and rhythm  Gastrointestinal: soft, non-tender; bowel sounds normal; no masses,  no organomegaly.    Musculoskeletal: Steady gait and movement  Skin: Cool and moist, posterior midline near hairline noted to have increased induration, and clear discharge from an ulcerated area that measured approximately 2cm x 2.5cm, consistent with asbscess  Psychiatric: Normal affect, non-agitated, not confused       LABS:  n/a    RADS: CLINICAL DATA: Palpable mass, midline posterior neck.  EXAM: CT NECK WITH CONTRAST  TECHNIQUE: Multidetector CT imaging of the neck was performed using the standard protocol following the bolus administration of intravenous contrast.  CONTRAST: 49mL ISOVUE-300 IOPAMIDOL (ISOVUE-300) INJECTION 61%  COMPARISON: None.  FINDINGS: Pharynx and larynx: Normal. No mass or swelling.  Salivary glands: No inflammation, mass, or stone.  Thyroid: Normal.  Lymph nodes: None enlarged or abnormal density.  Vascular: Negative.  Limited intracranial: Negative.  Visualized orbits: Negative.  Mastoids and visualized paranasal sinuses: Clear.  Skeleton: Spondylosis.  Upper chest: No mass or pneumothorax.  Other: In the posterior paramedian soft tissues of the neck, there is a subcutaneous low-attenuation fluid collection, estimated 18 x 13 x 14 mm cross-section, with a tract extending to the scan, consistent with a abscess or infected sebaceous cyst. There is extensive surrounding cellulitic change. Chronic appearing thickened dermis.  IMPRESSION: 18 x 13 x 14 mm subcutaneous abscess or infected sebaceous cyst in the LEFT posterior paramedian neck.   Electronically Signed  By: Staci Righter M.D.  On: 09/13/2018 14:13  Assessment:      Scalp abscess [L02.811]  Plan:   1. Scalp abscess [L02.811] Discussed surgical excision.  Alternatives include continued observation.  Benefits include possible symptom relief, pathologic evaluation, improved cosmesis. Discussed the risk of surgery including recurrence, chronic pain, post-op infxn, poor cosmesis, poor/delayed wound healing, and possible re-operation to address said risks. The risks of general anesthetic, if used, includes MI, CVA, sudden death or even reaction to anesthetic medications also discussed.  Typical  post-op recovery time of 3-5 days with possible activity restrictions were also discussed.  The  patient verbalized understanding and all questions were answered to the patient's satisfaction.  2. Due to acute nature and degree of swelling in area, we proceeded with attempted I&D in the office as noted below.  Pre-Op Dx: Scalp abscess [L02.811] Post-Op Dx: same Anesthesia: local  EBL: minimal Complications:  none apparent  Procedure: inicsion and drainage of Scalp abscess [L02.811]  Description of Procedure:   Consent obtained, time out performed.  Patient placed in prone position.  Area sterilized and draped in usual position.  Local anesthesia infused in area.  2cm incision made over most induration area and scant drainage of purulent material noted.    Gentle probing through incision did not note any further pockets  hemostasis confirmed and  Pt tolerated procedure well.  Will proceed with formal excision and exploration in OR since the degree of inflammation was more severe than expected through the tiny abscess pocket noted on procedure and CT.        Electronically signed by Benjamine Sprague, DO on 09/17/2018 3:33 PM

## 2018-09-17 NOTE — H&P (View-Only) (Signed)
Subjective:   CC: Scalp abscess [L02.811]  HPI:  Devon Rios is a 53 y.o. male who was referred by Ronn Melena, MD for evaluation of above. First noted 1 week ago.  Symptoms include: Pain is sharp, intermittent.  Exacerbated by nothing specific.  Alleviated by nothing specific.  Associated with increased swelling in area.  Noted some increased drainage yesterday     Past Medical History:  has a past medical history of Diabetes mellitus without complication (CMS-HCC), Hypertension, and Sleep apnea.  Past Surgical History:  has a past surgical history that includes gastric Sleeve (06/07/2017) and Arthroscopy Knee (Right, 2007).  Family History: family history is not on file.  Social History:  reports that he has never smoked. He has never used smokeless tobacco. He reports previous alcohol use. He reports that he does not use drugs.  Current Medications: has a current medication list which includes the following prescription(s): calcium carbonate, doxycycline, multivitamin, and tramadol.  Allergies:  No Known Allergies  ROS:  A 15 point review of systems was performed and pertinent positives and negatives noted in HPI   Objective:   BP (!) 184/96   Pulse 68   Ht 170.2 cm (5' 7.01")   Wt (!) 105.5 kg (232 lb 8 oz)   SpO2 98%   BMI 36.41 kg/m   Constitutional :  alert, appears stated age, cooperative and no distress  Lymphatics/Throat:  no asymmetry, masses, or scars  Respiratory:  clear to auscultation bilaterally  Cardiovascular:  regular rate and rhythm  Gastrointestinal: soft, non-tender; bowel sounds normal; no masses,  no organomegaly.    Musculoskeletal: Steady gait and movement  Skin: Cool and moist, posterior midline near hairline noted to have increased induration, and clear discharge from an ulcerated area that measured approximately 2cm x 2.5cm, consistent with asbscess  Psychiatric: Normal affect, non-agitated, not confused       LABS:  n/a    RADS: CLINICAL DATA: Palpable mass, midline posterior neck.  EXAM: CT NECK WITH CONTRAST  TECHNIQUE: Multidetector CT imaging of the neck was performed using the standard protocol following the bolus administration of intravenous contrast.  CONTRAST: 53mL ISOVUE-300 IOPAMIDOL (ISOVUE-300) INJECTION 61%  COMPARISON: None.  FINDINGS: Pharynx and larynx: Normal. No mass or swelling.  Salivary glands: No inflammation, mass, or stone.  Thyroid: Normal.  Lymph nodes: None enlarged or abnormal density.  Vascular: Negative.  Limited intracranial: Negative.  Visualized orbits: Negative.  Mastoids and visualized paranasal sinuses: Clear.  Skeleton: Spondylosis.  Upper chest: No mass or pneumothorax.  Other: In the posterior paramedian soft tissues of the neck, there is a subcutaneous low-attenuation fluid collection, estimated 18 x 13 x 14 mm cross-section, with a tract extending to the scan, consistent with a abscess or infected sebaceous cyst. There is extensive surrounding cellulitic change. Chronic appearing thickened dermis.  IMPRESSION: 18 x 13 x 14 mm subcutaneous abscess or infected sebaceous cyst in the LEFT posterior paramedian neck.   Electronically Signed  By: Staci Righter M.D.  On: 09/13/2018 14:13  Assessment:      Scalp abscess [L02.811]  Plan:   1. Scalp abscess [L02.811] Discussed surgical excision.  Alternatives include continued observation.  Benefits include possible symptom relief, pathologic evaluation, improved cosmesis. Discussed the risk of surgery including recurrence, chronic pain, post-op infxn, poor cosmesis, poor/delayed wound healing, and possible re-operation to address said risks. The risks of general anesthetic, if used, includes MI, CVA, sudden death or even reaction to anesthetic medications also discussed.  Typical  post-op recovery time of 3-5 days with possible activity restrictions were also discussed.  The  patient verbalized understanding and all questions were answered to the patient's satisfaction.  2. Due to acute nature and degree of swelling in area, we proceeded with attempted I&D in the office as noted below.  Pre-Op Dx: Scalp abscess [L02.811] Post-Op Dx: same Anesthesia: local  EBL: minimal Complications:  none apparent  Procedure: inicsion and drainage of Scalp abscess [L02.811]  Description of Procedure:   Consent obtained, time out performed.  Patient placed in prone position.  Area sterilized and draped in usual position.  Local anesthesia infused in area.  2cm incision made over most induration area and scant drainage of purulent material noted.    Gentle probing through incision did not note any further pockets  hemostasis confirmed and  Pt tolerated procedure well.  Will proceed with formal excision and exploration in OR since the degree of inflammation was more severe than expected through the tiny abscess pocket noted on procedure and CT.        Electronically signed by Benjamine Sprague, DO on 09/17/2018 3:33 PM

## 2018-09-18 ENCOUNTER — Encounter
Admission: RE | Admit: 2018-09-18 | Discharge: 2018-09-18 | Disposition: A | Discharge status: Home / Self Care | Payer: PRIVATE HEALTH INSURANCE | Source: Ambulatory Visit | Attending: Surgery | Admitting: Surgery

## 2018-09-18 ENCOUNTER — Other Ambulatory Visit: Payer: Self-pay

## 2018-09-18 NOTE — Patient Instructions (Signed)
Your procedure is scheduled on: 09-24-18 Report to Same Day Surgery 2nd floor medical mall [facility] Entrance-take elevator on left to 2nd floor.  Check in with surgery information desk.) To find out your arrival time please call [phone] between 1PM - 3PM on 09-23-18  Remember: Instructions that are not followed completely may result in serious medical risk, up to and including death, or upon the discretion of your surgeon and anesthesiologist your surgery may need to be rescheduled.    _x___ 1. Do not eat food after midnight the night before your procedure. You may drink clear liquids up to 2 hours before you are scheduled to arrive at the hospital for your procedure.  Do not drink clear liquids within 2 hours of your scheduled arrival to the hospital.  Clear liquids include  --Water or Apple juice without pulp  --Clear carbohydrate beverage such as ClearFast or Gatorade  --Black Coffee or Clear Tea (No milk, no creamers, do not add anything to the coffee or Tea   ____Ensure clear carbohydrate drink on the way to the hospital for bariatric patients  ____Ensure clear carbohydrate drink 3 hours before surgery for Dr Dwyane Luo patients if physician instructed.   No gum chewing or hard candies.     __x__ 2. No Alcohol for 24 hours before or after surgery.   __x__3. No Smoking or e-cigarettes for 24 prior to surgery.  Do not use any chewable tobacco products for at least 6 hour prior to surgery   ____  4. Bring all medications with you on the day of surgery if instructed.    __x__ 5. Notify your doctor if there is any change in your medical condition     (cold, fever, infections).    x___6. On the morning of surgery brush your teeth with toothpaste and water.  You may rinse your mouth with mouth wash if you wish.  Do not swallow any toothpaste or mouthwash.   Do not wear jewelry, make-up, hairpins, clips or nail polish.  Do not wear lotions, powders, or perfumes. You may wear  deodorant.  Do not shave 48 hours prior to surgery. Men may shave face and neck.  Do not bring valuables to the hospital.    [facility] is not responsible for any belongings or valuables.               Contacts, dentures or bridgework may not be worn into surgery.  Leave your suitcase in the car. After surgery it may be brought to your room.  For patients admitted to the hospital, discharge time is determined by your treatment team.  _  Patients discharged the day of surgery will not be allowed to drive home.  You will need someone to drive you home and stay with you the night of your procedure.    Please read over the following fact sheets that you were given:   [facility] Preparing for Surgery   ____ Take anti-hypertensive listed below, cardiac, seizure, asthma,anti-reflux and psychiatric medicines. These include:  1. NONE  2.  3.  4.  5.  6.  ____Fleets enema or Magnesium Citrate as directed.   ____ Use CHG Soap or sage wipes as directed on instruction sheet   ____ Use inhalers on the day of surgery and bring to hospital day of surgery  ____ Stop Metformin and Janumet 2 days prior to surgery.    ____ Take 1/2 of usual insulin dose the night before surgery and none on the  morning     surgery.   ____ Follow recommendations from Cardiologist, Pulmonologist or PCP regarding stopping Aspirin, Coumadin, Plavix ,Eliquis, Effient, or Pradaxa, and Pletal.  X____Stop Anti-inflammatories such as Advil, Aleve, Ibuprofen, Motrin, Naproxen, Naprosyn, Goodies powders or aspirin products. OK to take Tylenol OR TRAMADOL IF NEEDED   ____ Stop supplements until after surgery.     ____ Bring C-Pap to the hospital.

## 2018-09-23 MED ORDER — CEFAZOLIN SODIUM-DEXTROSE 2-4 GM/100ML-% IV SOLN
2.0000 g | INTRAVENOUS | Status: AC
  Administered 2018-09-24: 2 g via INTRAVENOUS

## 2018-09-24 ENCOUNTER — Ambulatory Visit
Admission: RE | Admit: 2018-09-24 | Discharge: 2018-09-24 | Disposition: A | Discharge status: Home / Self Care | Payer: 59 | Attending: Surgery | Admitting: Surgery

## 2018-09-24 ENCOUNTER — Ambulatory Visit: Payer: 59 | Admitting: Certified Registered"

## 2018-09-24 ENCOUNTER — Other Ambulatory Visit: Payer: Self-pay

## 2018-09-24 ENCOUNTER — Encounter
Admission: RE | Disposition: A | Discharge status: Home / Self Care | Payer: Self-pay | Source: Home / Self Care | Attending: Surgery

## 2018-09-24 ENCOUNTER — Encounter: Payer: Self-pay | Admitting: Anesthesiology

## 2018-09-24 DIAGNOSIS — Z6835 Body mass index (BMI) 35.0-35.9, adult: Secondary | ICD-10-CM | POA: Diagnosis not present

## 2018-09-24 DIAGNOSIS — E119 Type 2 diabetes mellitus without complications: Secondary | ICD-10-CM | POA: Diagnosis not present

## 2018-09-24 DIAGNOSIS — I10 Essential (primary) hypertension: Secondary | ICD-10-CM | POA: Diagnosis not present

## 2018-09-24 DIAGNOSIS — R51 Headache: Secondary | ICD-10-CM | POA: Diagnosis not present

## 2018-09-24 DIAGNOSIS — L02811 Cutaneous abscess of head [any part, except face]: Secondary | ICD-10-CM | POA: Insufficient documentation

## 2018-09-24 DIAGNOSIS — M1990 Unspecified osteoarthritis, unspecified site: Secondary | ICD-10-CM | POA: Diagnosis not present

## 2018-09-24 DIAGNOSIS — Z79899 Other long term (current) drug therapy: Secondary | ICD-10-CM | POA: Insufficient documentation

## 2018-09-24 DIAGNOSIS — G4730 Sleep apnea, unspecified: Secondary | ICD-10-CM | POA: Insufficient documentation

## 2018-09-24 HISTORY — PX: INCISION AND DRAINAGE ABSCESS: SHX5864

## 2018-09-24 LAB — GLUCOSE, CAPILLARY
Glucose-Capillary: 89 mg/dL (ref 70–99)
Glucose-Capillary: 87 mg/dL (ref 70–99)

## 2018-09-24 SURGERY — INCISION AND DRAINAGE, ABSCESS
Anesthesia: General

## 2018-09-24 MED ORDER — LIDOCAINE HCL (PF) 2 % IJ SOLN
INTRAMUSCULAR | Status: AC
  Filled 2018-09-24: qty 10

## 2018-09-24 MED ORDER — FENTANYL CITRATE (PF) 100 MCG/2ML IJ SOLN
INTRAMUSCULAR | Status: DC | PRN
  Administered 2018-09-24: 100 ug via INTRAVENOUS

## 2018-09-24 MED ORDER — ONDANSETRON HCL 4 MG/2ML IJ SOLN
INTRAMUSCULAR | Status: AC
  Filled 2018-09-24: qty 2

## 2018-09-24 MED ORDER — CEFAZOLIN SODIUM-DEXTROSE 2-4 GM/100ML-% IV SOLN
INTRAVENOUS | Status: AC
  Filled 2018-09-24: qty 100

## 2018-09-24 MED ORDER — FENTANYL CITRATE (PF) 100 MCG/2ML IJ SOLN
25.0000 ug | INTRAMUSCULAR | Status: DC | PRN
  Administered 2018-09-24 (×4): 25 ug via INTRAVENOUS

## 2018-09-24 MED ORDER — BUPIVACAINE-EPINEPHRINE (PF) 0.5% -1:200000 IJ SOLN
INTRAMUSCULAR | Status: AC
  Filled 2018-09-24: qty 90

## 2018-09-24 MED ORDER — PROPOFOL 10 MG/ML IV BOLUS
INTRAVENOUS | Status: DC | PRN
  Administered 2018-09-24: 160 mg via INTRAVENOUS

## 2018-09-24 MED ORDER — CHLORHEXIDINE GLUCONATE CLOTH 2 % EX PADS: 6.0000 | MEDICATED_PAD | Freq: Once | CUTANEOUS | Status: DC

## 2018-09-24 MED ORDER — DEXAMETHASONE SODIUM PHOSPHATE 10 MG/ML IJ SOLN
INTRAMUSCULAR | Status: DC | PRN
  Administered 2018-09-24: 8 mg via INTRAVENOUS

## 2018-09-24 MED ORDER — MIDAZOLAM HCL 2 MG/2ML IJ SOLN
INTRAMUSCULAR | Status: DC | PRN
  Administered 2018-09-24: 2 mg via INTRAVENOUS

## 2018-09-24 MED ORDER — PHENYLEPHRINE HCL 10 MG/ML IJ SOLN
INTRAMUSCULAR | Status: DC | PRN
  Administered 2018-09-24: 200 ug via INTRAVENOUS

## 2018-09-24 MED ORDER — SUGAMMADEX SODIUM 200 MG/2ML IV SOLN
INTRAVENOUS | Status: DC | PRN
  Administered 2018-09-24: 150 mg via INTRAVENOUS
  Administered 2018-09-24: 50 mg via INTRAVENOUS

## 2018-09-24 MED ORDER — LACTATED RINGERS IV SOLN
INTRAVENOUS | Status: DC
  Administered 2018-09-24: 09:00:00 via INTRAVENOUS

## 2018-09-24 MED ORDER — ROCURONIUM BROMIDE 100 MG/10ML IV SOLN
INTRAVENOUS | Status: DC | PRN
  Administered 2018-09-24 (×2): 10 mg via INTRAVENOUS

## 2018-09-24 MED ORDER — DEXAMETHASONE SODIUM PHOSPHATE 10 MG/ML IJ SOLN
INTRAMUSCULAR | Status: AC
  Filled 2018-09-24: qty 1

## 2018-09-24 MED ORDER — IBUPROFEN 800 MG PO TABS: 800.0000 mg | ORAL_TABLET | Freq: Three times a day (TID) | ORAL | 0 refills | Status: DC | PRN

## 2018-09-24 MED ORDER — FENTANYL CITRATE (PF) 100 MCG/2ML IJ SOLN
INTRAMUSCULAR | Status: AC
  Administered 2018-09-24: 25 ug via INTRAVENOUS
  Filled 2018-09-24: qty 2

## 2018-09-24 MED ORDER — SUCCINYLCHOLINE CHLORIDE 20 MG/ML IJ SOLN
INTRAMUSCULAR | Status: AC
  Filled 2018-09-24: qty 1

## 2018-09-24 MED ORDER — SUCCINYLCHOLINE CHLORIDE 20 MG/ML IJ SOLN
INTRAMUSCULAR | Status: DC | PRN
  Administered 2018-09-24: 120 mg via INTRAVENOUS

## 2018-09-24 MED ORDER — HYDROCODONE-ACETAMINOPHEN 5-325 MG PO TABS
1.0000 | ORAL_TABLET | Freq: Once | ORAL | Status: AC
  Administered 2018-09-24: 1 via ORAL

## 2018-09-24 MED ORDER — ACETAMINOPHEN 325 MG PO TABS: 650.0000 mg | ORAL_TABLET | Freq: Three times a day (TID) | ORAL | 0 refills | Status: AC | PRN

## 2018-09-24 MED ORDER — MIDAZOLAM HCL 2 MG/2ML IJ SOLN
INTRAMUSCULAR | Status: AC
  Filled 2018-09-24: qty 2

## 2018-09-24 MED ORDER — ONDANSETRON HCL 4 MG/2ML IJ SOLN: 4.0000 mg | Freq: Once | INTRAMUSCULAR | Status: DC | PRN

## 2018-09-24 MED ORDER — HYDROCODONE-ACETAMINOPHEN 5-325 MG PO TABS
ORAL_TABLET | ORAL | Status: AC
  Filled 2018-09-24: qty 1

## 2018-09-24 MED ORDER — ONDANSETRON HCL 4 MG/2ML IJ SOLN
INTRAMUSCULAR | Status: DC | PRN
  Administered 2018-09-24: 4 mg via INTRAVENOUS

## 2018-09-24 MED ORDER — LIDOCAINE HCL (CARDIAC) PF 100 MG/5ML IV SOSY
PREFILLED_SYRINGE | INTRAVENOUS | Status: DC | PRN
  Administered 2018-09-24: 100 mg via INTRAVENOUS

## 2018-09-24 MED ORDER — BUPIVACAINE-EPINEPHRINE 0.5% -1:200000 IJ SOLN
INTRAMUSCULAR | Status: DC | PRN
  Administered 2018-09-24: 1 mL

## 2018-09-24 MED ORDER — HYDROCODONE-ACETAMINOPHEN 5-325 MG PO TABS: 1.0000 | ORAL_TABLET | Freq: Four times a day (QID) | ORAL | 0 refills | Status: AC | PRN

## 2018-09-24 MED ORDER — PROPOFOL 10 MG/ML IV BOLUS
INTRAVENOUS | Status: AC
  Filled 2018-09-24: qty 20

## 2018-09-24 MED ORDER — FENTANYL CITRATE (PF) 100 MCG/2ML IJ SOLN
INTRAMUSCULAR | Status: AC
  Filled 2018-09-24: qty 2

## 2018-09-24 MED ORDER — DOCUSATE SODIUM 100 MG PO CAPS: 100.0000 mg | ORAL_CAPSULE | Freq: Two times a day (BID) | ORAL | 0 refills | Status: AC | PRN

## 2018-09-24 MED ORDER — ROCURONIUM BROMIDE 50 MG/5ML IV SOLN
INTRAVENOUS | Status: AC
  Filled 2018-09-24: qty 1

## 2018-09-24 SURGICAL SUPPLY — 38 items
BLADE SURG 15 STRL LF DISP TIS (BLADE) ×1 IMPLANT
BLADE SURG 15 STRL SS (BLADE) ×2
CHLORAPREP W/TINT 26ML (MISCELLANEOUS) ×3 IMPLANT
COVER WAND RF STERILE (DRAPES) ×3 IMPLANT
DERMABOND ADVANCED (GAUZE/BANDAGES/DRESSINGS) ×2
DERMABOND ADVANCED .7 DNX12 (GAUZE/BANDAGES/DRESSINGS) ×1 IMPLANT
DRAPE LAPAROTOMY 100X77 ABD (DRAPES) ×3 IMPLANT
DRAPE LAPAROTOMY 77X122 PED (DRAPES) ×3 IMPLANT
DRAPE SHEET LG 3/4 BI-LAMINATE (DRAPES) ×3 IMPLANT
ELECT CAUTERY BLADE 6.4 (BLADE) ×3 IMPLANT
ELECT REM PT RETURN 9FT ADLT (ELECTROSURGICAL) ×3
ELECTRODE REM PT RTRN 9FT ADLT (ELECTROSURGICAL) ×1 IMPLANT
GAUZE PACKING IODOFORM 1X5 (MISCELLANEOUS) ×3 IMPLANT
GAUZE SPONGE 4X4 12PLY STRL (GAUZE/BANDAGES/DRESSINGS) ×3 IMPLANT
GLOVE BIOGEL PI IND STRL 7.0 (GLOVE) ×1 IMPLANT
GLOVE BIOGEL PI INDICATOR 7.0 (GLOVE) ×2
GLOVE SURG SYN 6.5 ES PF (GLOVE) ×3 IMPLANT
GOWN STRL REUS W/ TWL LRG LVL3 (GOWN DISPOSABLE) ×2 IMPLANT
GOWN STRL REUS W/TWL LRG LVL3 (GOWN DISPOSABLE) ×4
HEMOSTAT SURGICEL 2X3 (HEMOSTASIS) ×3 IMPLANT
KIT TURNOVER KIT A (KITS) ×3 IMPLANT
LABEL OR SOLS (LABEL) ×3 IMPLANT
NEEDLE HYPO 22GX1.5 SAFETY (NEEDLE) ×3 IMPLANT
NS IRRIG 1000ML POUR BTL (IV SOLUTION) ×3 IMPLANT
PACK BASIN MINOR ARMC (MISCELLANEOUS) ×3 IMPLANT
PAD ABD DERMACEA PRESS 5X9 (GAUZE/BANDAGES/DRESSINGS) ×3 IMPLANT
SUCTION FRAZIER HANDLE 10FR (MISCELLANEOUS) ×2
SUCTION TUBE FRAZIER 10FR DISP (MISCELLANEOUS) ×1 IMPLANT
SUT ETHILON 3-0 FS-10 30 BLK (SUTURE)
SUT MNCRL 4-0 (SUTURE) ×2
SUT MNCRL 4-0 27XMFL (SUTURE) ×1
SUT VIC AB 3-0 SH 27 (SUTURE) ×2
SUT VIC AB 3-0 SH 27X BRD (SUTURE) ×1 IMPLANT
SUTURE EHLN 3-0 FS-10 30 BLK (SUTURE) IMPLANT
SUTURE MNCRL 4-0 27XMF (SUTURE) ×1 IMPLANT
SYR 30ML LL (SYRINGE) ×3 IMPLANT
SYR BULB IRRIG 60ML STRL (SYRINGE) ×3 IMPLANT
TOWEL OR 17X26 4PK STRL BLUE (TOWEL DISPOSABLE) ×3 IMPLANT

## 2018-09-24 NOTE — Interval H&P Note (Signed)
History and Physical Interval Note:  09/24/2018 8:31 AM  Devon Rios  has presented today for surgery, with the diagnosis of SCALP ABSCESS  The various methods of treatment have been discussed with the patient and family. After consideration of risks, benefits and other options for treatment, the patient has consented to  Procedure(s): INCISION AND DRAINAGE OF CYST, POSTERIOR SCALP (N/A) as a surgical intervention .  The patient's history has been reviewed, patient examined, no change in status, stable for surgery.  I have reviewed the patient's chart and labs.  Questions were answered to the patient's satisfaction.     Aurelius Gildersleeve Lysle Pearl

## 2018-09-24 NOTE — Anesthesia Post-op Follow-up Note (Signed)
Anesthesia QCDR form completed.        

## 2018-09-24 NOTE — Discharge Instructions (Signed)
-   tylenol and advil as needed for discomfort.  Please alternate between the two every four hours as needed for pain.   - Use narcotics, if prescribed, only when tylenol and motrin is not enough to control pain. - 325-650mg  every 8hrs to max of 4000mg /24hrs (including the 325mg  in every norco dose) for the tylenol.   - Advil up to 800mg  per dose every 8hrs as needed for pain.    -Keep dressing intact until followup in 48hrs for wound check.  Ok to sponge bath till then - Call office during normal business hours for sudden increase in pain, discharge, and/or accompanying fever.  Go to nearest urgent care/ED for afterhour care.  AMBULATORY SURGERY  DISCHARGE INSTRUCTIONS   1) The drugs that you were given will stay in your system until tomorrow so for the next 24 hours you should not:  A) Drive an automobile B) Make any legal decisions C) Drink any alcoholic beverage   2) You may resume regular meals tomorrow.  Today it is better to start with liquids and gradually work up to solid foods.  You may eat anything you prefer, but it is better to start with liquids, then soup and crackers, and gradually work up to solid foods.   3) Please notify your doctor immediately if you have any unusual bleeding, trouble breathing, redness and pain at the surgery site, drainage, fever, or pain not relieved by medication.    4) Additional Instructions:        Please contact your physician with any problems or Same Day Surgery at [phone], Monday through Friday 6 am to 4 pm, or [place] at [facility] Suite number at [phone].

## 2018-09-24 NOTE — Op Note (Signed)
Preoperative diagnosis: scalp abscess Postoperative diagnosis: same  Procedure: Incision and drainage of posterior midline scalp abscess  Anesthesia: GETA  Surgeon: Lysle Pearl  Wound Classification: Contaminated  Indications: Patient is a 53 y.o. male  presented with above.  See H&P for further details.  Specimen: culture  Complications: None  Estimated Blood Loss: 57mL  Findings:  1. As above 2. purulent secretions drained and cultured 3. Adequate hemostasis.   Description of procedure: The patient was placed in the prone position and GETA anesthesia was induced. The area was prepped and draped in the usual sterile fashion. A timeout was completed verifying correct patient, procedure, site, positioning, and implant(s) and/or special equipment prior to beginning this procedure.  Local infused over planned incision site.  4cm Inicision made and purulent secretions was drained, approxmately 79ml Cultures taken.  With a hemostat blunt dissection of septas performed to drain the abscess completely.  Pieces of the abscess capsule removed with blunt dissection and forceps down to fascia. The wound then irrigated, hemostasis achieved with electrocautery and surgicell, then packed with an iodine packing, dressed with 4x4s and secured with paper tape.  The patient tolerated the procedure well and was taken to the postanesthesia care unit in satisfactory condition.

## 2018-09-24 NOTE — Anesthesia Preprocedure Evaluation (Signed)
Anesthesia Evaluation  Patient identified by MRN, date of birth, ID band Patient awake    Reviewed: Allergy & Precautions, H&P , NPO status , Patient's Chart, lab work & pertinent test results  Airway Mallampati: II   Neck ROM: full    Dental   Pulmonary sleep apnea ,    breath sounds clear to auscultation       Cardiovascular hypertension,  Rhythm:regular Rate:Normal     Neuro/Psych  Headaches, negative psych ROS   GI/Hepatic Neg liver ROS,   Endo/Other  diabetes, Type 2Morbid obesity  Renal/GU negative Renal ROS  negative genitourinary   Musculoskeletal  (+) Arthritis ,   Abdominal   Peds  Hematology   Anesthesia Other Findings   Reproductive/Obstetrics                             Anesthesia Physical  Anesthesia Plan  ASA: II  Anesthesia Plan: General   Post-op Pain Management:    Induction: Intravenous  PONV Risk Score and Plan: 2 and Ondansetron, Dexamethasone, Midazolam and Treatment may vary due to age or medical condition  Airway Management Planned: Oral ETT  Additional Equipment:   Intra-op Plan:   Post-operative Plan: Extubation in OR  Informed Consent: I have reviewed the patients History and Physical, chart, labs and discussed the procedure including the risks, benefits and alternatives for the proposed anesthesia with the patient or authorized representative who has indicated his/her understanding and acceptance.       Plan Discussed with: CRNA, Anesthesiologist and Surgeon  Anesthesia Plan Comments:         Anesthesia Quick Evaluation

## 2018-09-24 NOTE — Transfer of Care (Signed)
Immediate Anesthesia Transfer of Care Note  Patient: Devon Rios  Procedure(s) Performed: INCISION AND DRAINAGE OF CYST, POSTERIOR SCALP (N/A )  Patient Location: PACU  Anesthesia Type:General  Level of Consciousness: awake and alert   Airway & Oxygen Therapy: Patient Spontanous Breathing and Patient connected to face mask oxygen  Post-op Assessment: Report given to RN and Post -op Vital signs reviewed and stable  Post vital signs: Reviewed and stable  Last Vitals:  Vitals Value Taken Time  BP 160/95 09/24/2018 10:19 AM  Temp    Pulse 73 09/24/2018 10:22 AM  Resp 17 09/24/2018 10:22 AM  SpO2 100 % 09/24/2018 10:22 AM  Vitals shown include unvalidated device data.  Last Pain:  Vitals:   09/24/18 0836  TempSrc: Oral  PainSc: 0-No pain         Complications: No apparent anesthesia complications

## 2018-09-24 NOTE — Anesthesia Procedure Notes (Deleted)
Performed by: Rona Ravens, CRNA

## 2018-09-24 NOTE — Anesthesia Procedure Notes (Signed)
Procedure Name: Intubation Date/Time: 09/24/2018 9:27 AM Performed by: Rona Ravens, CRNA Pre-anesthesia Checklist: Patient identified Patient Re-evaluated:Patient Re-evaluated prior to induction Oxygen Delivery Method: Circle system utilized Preoxygenation: Pre-oxygenation with 100% oxygen Induction Type: IV induction Ventilation: Oral airway inserted - appropriate to patient size Laryngoscope Size: Mac and 4 Grade View: Grade III Tube type: Oral Tube size: 7.5 mm Number of attempts: 1 Airway Equipment and Method: Stylet Placement Confirmation: ETT inserted through vocal cords under direct vision,  positive ETCO2 and breath sounds checked- equal and bilateral Secured at: 23 cm Tube secured with: Tape Dental Injury: Teeth and Oropharynx as per pre-operative assessment

## 2018-09-25 NOTE — Anesthesia Postprocedure Evaluation (Signed)
Anesthesia Post Note  Patient: Delaney Schnick  Procedure(s) Performed: INCISION AND DRAINAGE OF CYST, POSTERIOR SCALP (N/A )  Patient location during evaluation: PACU Anesthesia Type: General Level of consciousness: awake and alert and oriented Pain management: pain level controlled Vital Signs Assessment: post-procedure vital signs reviewed and stable Respiratory status: spontaneous breathing Cardiovascular status: blood pressure returned to baseline Anesthetic complications: no     Last Vitals:  Vitals:   09/24/18 1123 09/24/18 1154  BP: (!) 146/82 (!) 121/96  Pulse: 60 (!) 56  Resp: 17   Temp: 36.5 C   SpO2: 98% 100%    Last Pain:  Vitals:   09/25/18 0846  TempSrc:   PainSc: 0-No pain                 Shyane Fossum

## 2018-09-26 DIAGNOSIS — L02811 Cutaneous abscess of head [any part, except face]: Secondary | ICD-10-CM | POA: Insufficient documentation

## 2018-09-29 LAB — AEROBIC/ANAEROBIC CULTURE W GRAM STAIN (SURGICAL/DEEP WOUND): Culture: NORMAL

## 2018-09-30 ENCOUNTER — Encounter: Payer: Self-pay | Admitting: Internal Medicine

## 2018-09-30 ENCOUNTER — Ambulatory Visit (INDEPENDENT_AMBULATORY_CARE_PROVIDER_SITE_OTHER): Payer: PRIVATE HEALTH INSURANCE | Admitting: Internal Medicine

## 2018-09-30 VITALS — BP 138/82 | HR 65 | Ht 67.0 in | Wt 233.6 lb

## 2018-09-30 DIAGNOSIS — G4733 Obstructive sleep apnea (adult) (pediatric): Secondary | ICD-10-CM

## 2018-09-30 NOTE — Patient Instructions (Signed)
Call us back if/when you would like to do a new study to see if you could do a lower pressure on your CPAP.

## 2018-09-30 NOTE — Addendum Note (Signed)
Addended by: Vivia Ewing on: 09/30/2018 10:36 AM   Modules accepted: Orders

## 2018-09-30 NOTE — Addendum Note (Signed)
Addended by: Darreld Mclean on: 09/30/2018 10:38 AM   Modules accepted: Orders

## 2018-09-30 NOTE — Progress Notes (Addendum)
[facility]     Assessment and Plan:  Obstructive sleep apnea. -The patient's sleep apnea appears to be adequately treated, he is using his VPAP auto machine daily, approximately 8 hours per day, setting is 22/16, spontaneous. Residual apnea is minimal. -We will give the prescription for renewal of Pap supplies, he is currently having some issues with mask leak, [facility]. - Patient has lost about 120 pounds in the last 18 months which may be contributing to his leak, particularly as he is at a high BiPAP pressure.  Discussed going for CPAP titration study to find out a new pressure for him.  He would like to postpone this for now as he is having a cyst removed in the back of his neck.  He will call us back when he is interested in doing this, and we can order a CPAP titration test.  Insomnia. -Appears predominantly maintenance type of insomnia, doing well with melatonin.      Date: 09/30/2018  MRN# [id number] Devon Rios 1965/11/02    Devon Rios is a 53 y.o. old male seen in consultation for chief complaint of:    Chief Complaint  Patient presents with  . Follow-up    Visit for cpap supplies. States his cpap has been working great.     HPI:  The patient is a 53 year old male with a known history obstructive sleep apnea and insomnia.  At last visit he was noted to be on CPAP pressure of 22/16.  He has insomnia and takes melatonin at night as needed. Since his last visit he has been doing well with his CPAP. 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 His current machine is about 53 year old, he tolerates it fine.   **CPAP download>> 07/07/2018- 09/22/2018, raw data personally reviewed.  Patient has excellent compliance with nightly usage, he displays graphical, raw data is not listed.  He appears to use his CPAP for about 6 to 7 hours per night.  Other data is not  available. **download 11/26/16; residual AHI 0.3; bipap 22/16.  **download data from the patient's VPAP auto on 01/03/2016: Machine-S9 VPAP-Auto; spontaneous mode, 22/16. Residual AHI is 0.3, average daily usage is 8 hours and 14 minutes on 100% of days.  Medication:    reviewed.    Allergies:  Patient has no known allergies.   Review of Systems:  Constitutional: Feels well. Cardiovascular: Denies chest pain, exertional chest pain.  Pulmonary: Denies hemoptysis, pleuritic chest pain.   The remainder of systems were reviewed and were found to be negative other than what is documented in the HPI.    Physical Examination:   VS: BP 138/82   Pulse 65   Ht 5\' 7"  (1.702 m)   Wt 233 lb 9.6 oz (106 kg)   SpO2 99%   BMI 36.59 kg/m   General Appearance: No distress  Neuro:without focal findings, mental status, speech normal, alert and oriented HEENT: PERRLA, EOM intact Pulmonary: No wheezing, No rales  CardiovascularNormal S1,S2.  No m/r/g.  Abdomen: Benign, Soft, non-tender, No masses Renal:  No costovertebral tenderness  GU:  No performed at this time. Endoc: No evident thyromegaly, no signs of acromegaly or Cushing features Skin:   warm, no rashes, no ecchymosis  Extremities: normal, no cyanosis, clubbing.    LABORATORY PANEL:   CBC No results for input(s): WBC, HGB,  HCT, PLT in the last 168 hours. ------------------------------------------------------------------------------------------------------------------  Chemistries  No results for input(s): NA, K, CL, CO2, GLUCOSE, BUN, CREATININE, CALCIUM, MG, AST, ALT, ALKPHOS, BILITOT in the last 168 hours.  Invalid input(s): GFRCGP ------------------------------------------------------------------------------------------------------------------  Cardiac Enzymes No results for input(s): TROPONINI in the last 168 hours. ------------------------------------------------------------  RADIOLOGY:  No results  found.     Thank  you for the consultation and for allowing [facility] to assist in the care of your patient. Our recommendations are noted above.  Please contact us if we can be of further service.  Marda Stalker, M.D., F.C.C.P.  Board Certified in Internal Medicine, Pulmonary Medicine, [place], and Sleep Medicine.  [facility] Office Number: [phone]   09/30/2018

## 2018-10-02 ENCOUNTER — Telehealth: Payer: Self-pay | Admitting: Internal Medicine

## 2018-10-02 NOTE — Telephone Encounter (Signed)
Called and spoke with patient and advised patient that [facility] was not in network with his insurance that I would send his CPAP Supply order to [place].  Pt is aware that Huey Romans will be contacting him to arrange. Order faxed to [place] and confirmation was received.  Nothing else needed at this time. Rhonda J Cobb

## 2018-10-28 NOTE — Telephone Encounter (Signed)
Pt called and stated that he has not heard anything from [place] and it has been 3 weeks now. He was told that if he hasn't heard anything within a week to call office.  CB # [phone]

## 2018-10-28 NOTE — Telephone Encounter (Signed)
Called and spoke with Crystal at [facility] and she stated that [employer] had attempted to contact patient twice and was unable to leave a voice message due to the VM box being full.  Per Crystal they only need the make and model of his machine to make sure he gets the proper supplies.    I contacted the patient and relayed the above message and gave him the phone number to [employer] [phone].  Pt stated that he would contact [employer] to provide machine information.  Pt stated that he had not received call from [place] and that he clears his VM.  Pt to contact [employer].Rhonda J Cobb

## 2019-01-20 ENCOUNTER — Encounter (HOSPITAL_COMMUNITY): Payer: Self-pay

## 2019-05-13 IMAGING — DX DG KNEE COMPLETE 4+V*L*
4 series · 4 of 4 positions shown · non-contrast
Comparison: None.

CLINICAL DATA: 50-year-old male with chronic left knee pain since
stepping in hole years ago. No recent falls or trauma

EXAM:
LEFT KNEE - COMPLETE 4+ VIEW

[knee ap (1 of 2)]
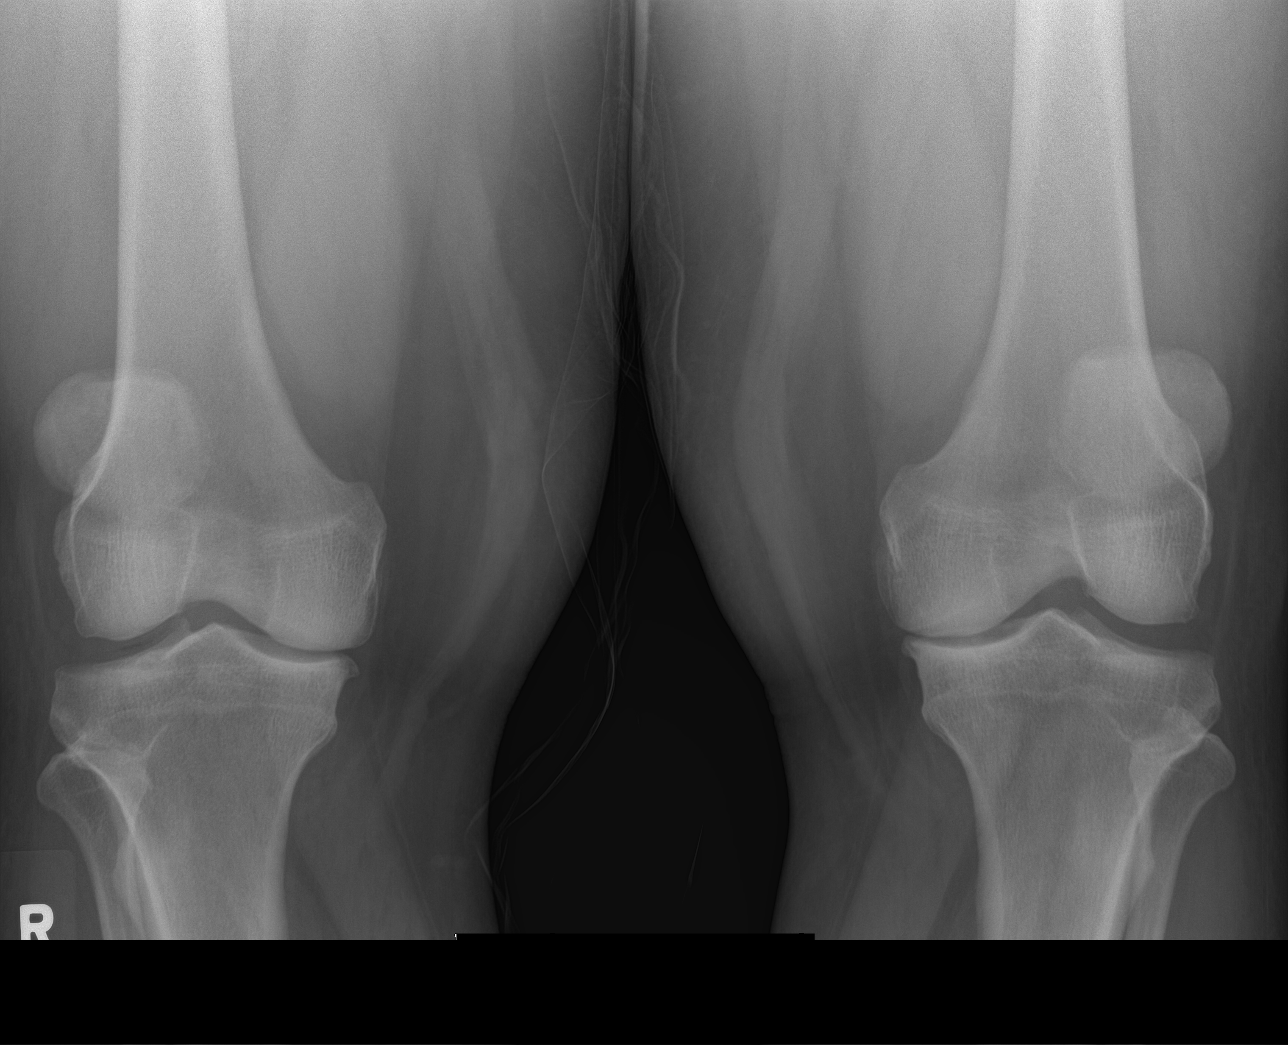

[knee lat]
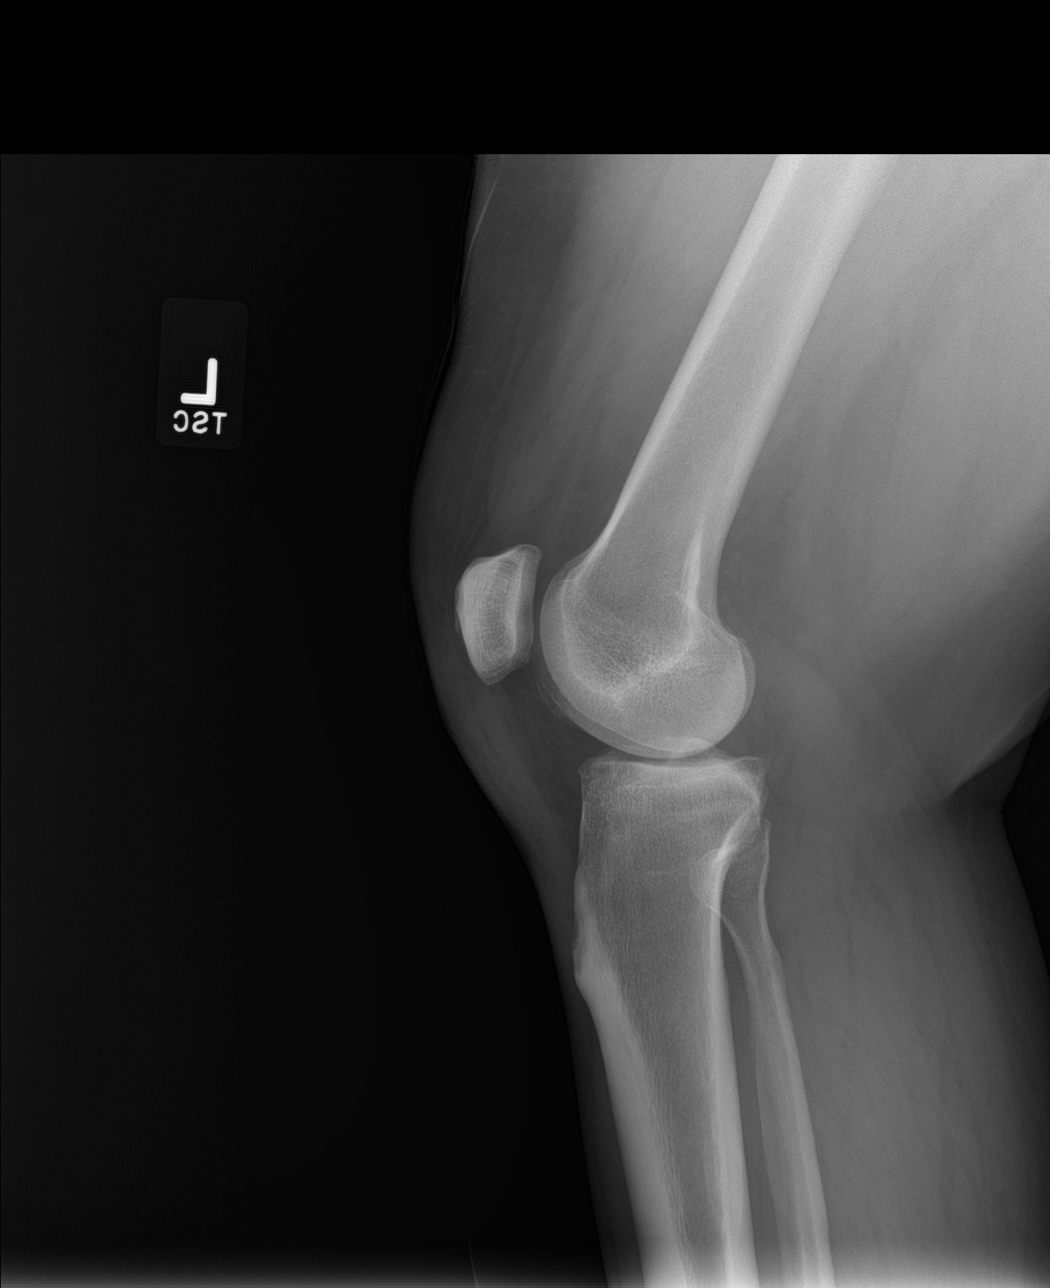

[patella skyline]
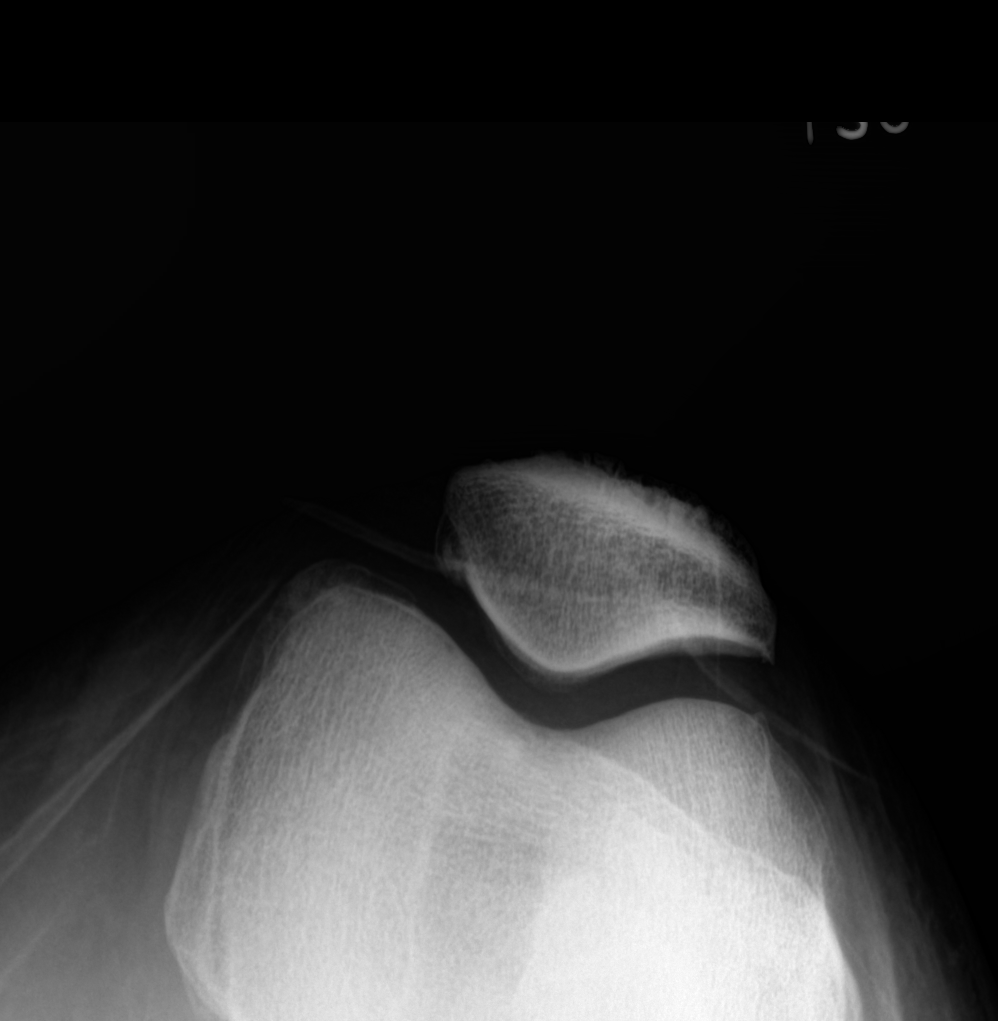

[knee ap (2 of 2)]
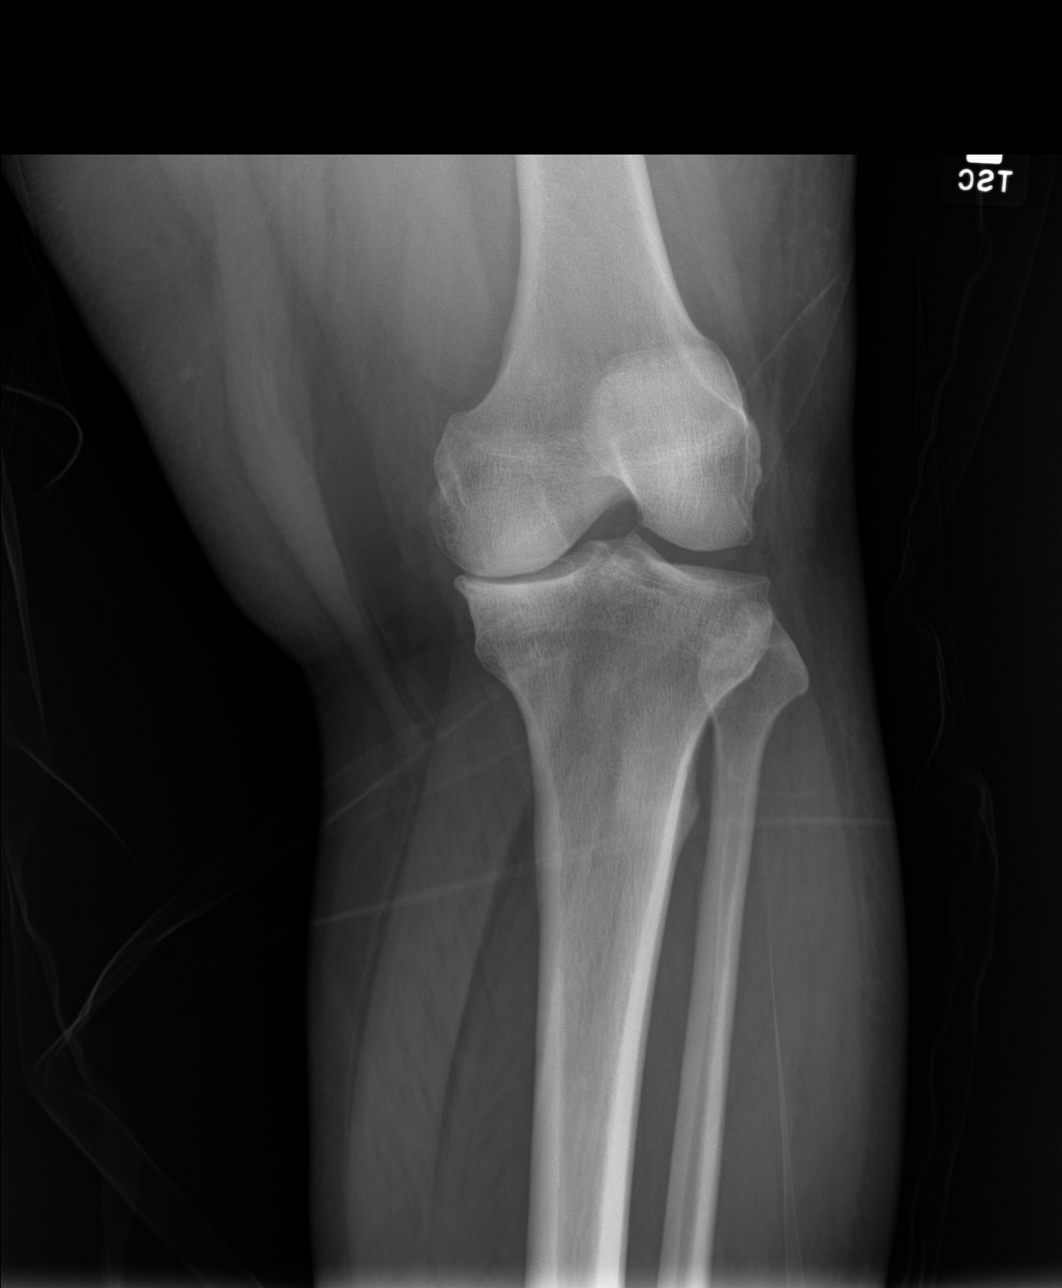

[4 of 4 positions shown; findings below may reference images not displayed]

FINDINGS: No evidence of acute fracture, malalignment or joint effusion. No
focal soft tissue abnormality. There is significant loss of joint
space height in the medial compartments bilaterally worse on the
left than the right with associated subchondral sclerosis and mild
osteophyte formation. Normal bony mineralization. No lytic or
blastic osseous lesion.
IMPRESSION: Medial compartment chondromalacia with degenerative osteoarthritis.

## 2019-12-24 ENCOUNTER — Encounter (HOSPITAL_COMMUNITY): Payer: Self-pay

## 2019-12-31 ENCOUNTER — Ambulatory Visit
Admission: RE | Admit: 2019-12-31 | Discharge: 2019-12-31 | Disposition: A | Discharge status: Home / Self Care | Payer: PRIVATE HEALTH INSURANCE | Source: Ambulatory Visit | Attending: Student | Admitting: Student

## 2019-12-31 ENCOUNTER — Other Ambulatory Visit: Payer: Self-pay

## 2019-12-31 ENCOUNTER — Other Ambulatory Visit: Payer: Self-pay | Admitting: Student

## 2019-12-31 DIAGNOSIS — M79604 Pain in right leg: Secondary | ICD-10-CM

## 2019-12-31 DIAGNOSIS — M7989 Other specified soft tissue disorders: Secondary | ICD-10-CM

## 2020-01-09 DIAGNOSIS — Z6841 Body Mass Index (BMI) 40.0 and over, adult: Secondary | ICD-10-CM | POA: Insufficient documentation

## 2020-01-09 DIAGNOSIS — M1711 Unilateral primary osteoarthritis, right knee: Secondary | ICD-10-CM | POA: Insufficient documentation

## 2020-03-01 ENCOUNTER — Encounter: Payer: Self-pay | Admitting: Internal Medicine

## 2020-03-02 ENCOUNTER — Ambulatory Visit: Payer: PRIVATE HEALTH INSURANCE | Admitting: Internal Medicine

## 2020-03-04 ENCOUNTER — Ambulatory Visit (INDEPENDENT_AMBULATORY_CARE_PROVIDER_SITE_OTHER): Payer: PRIVATE HEALTH INSURANCE | Admitting: Internal Medicine

## 2020-03-04 ENCOUNTER — Encounter: Payer: Self-pay | Admitting: Internal Medicine

## 2020-03-04 ENCOUNTER — Other Ambulatory Visit: Payer: Self-pay

## 2020-03-04 VITALS — BP 168/102 | HR 92 | Temp 98.0°F | Wt 277.0 lb

## 2020-03-04 DIAGNOSIS — E119 Type 2 diabetes mellitus without complications: Secondary | ICD-10-CM | POA: Diagnosis not present

## 2020-03-04 DIAGNOSIS — E118 Type 2 diabetes mellitus with unspecified complications: Secondary | ICD-10-CM | POA: Insufficient documentation

## 2020-03-04 DIAGNOSIS — Z9989 Dependence on other enabling machines and devices: Secondary | ICD-10-CM

## 2020-03-04 DIAGNOSIS — I10 Essential (primary) hypertension: Secondary | ICD-10-CM

## 2020-03-04 DIAGNOSIS — E1165 Type 2 diabetes mellitus with hyperglycemia: Secondary | ICD-10-CM | POA: Insufficient documentation

## 2020-03-04 DIAGNOSIS — M170 Bilateral primary osteoarthritis of knee: Secondary | ICD-10-CM | POA: Diagnosis not present

## 2020-03-04 DIAGNOSIS — G4733 Obstructive sleep apnea (adult) (pediatric): Secondary | ICD-10-CM | POA: Diagnosis not present

## 2020-03-04 LAB — COMPREHENSIVE METABOLIC PANEL
ALT: 44 U/L (ref 0–53)
AST: 80 U/L — ABNORMAL HIGH (ref 0–37)
Albumin: 4.4 g/dL (ref 3.5–5.2)
Alkaline Phosphatase: 71 U/L (ref 39–117)
BUN: 22 mg/dL (ref 6–23)
CO2: 30 mEq/L (ref 19–32)
Calcium: 10.5 mg/dL (ref 8.4–10.5)
Chloride: 103 mEq/L (ref 96–112)
Creatinine, Ser: 1.45 mg/dL (ref 0.40–1.50)
GFR: 61.44 mL/min (ref >60.00)
Glucose, Bld: 89 mg/dL (ref 70–99)
Potassium: 4.7 mEq/L (ref 3.5–5.1)
Sodium: 141 mEq/L (ref 135–145)
Total Bilirubin: 0.6 mg/dL (ref 0.2–1.2)
Total Protein: 7.6 g/dL (ref 6.0–8.3)

## 2020-03-04 LAB — CBC
HCT: 44.9 % (ref 39.0–52.0)
Hemoglobin: 15 g/dL (ref 13.0–17.0)
MCHC: 33.5 g/dL (ref 30.0–36.0)
MCV: 88.4 fl (ref 78.0–100.0)
Platelets: 160 10*3/uL (ref 150.0–400.0)
RBC: 5.07 Mil/uL (ref 4.22–5.81)
RDW: 14.5 % (ref 11.5–15.5)
WBC: 6.4 10*3/uL (ref 4.0–10.5)

## 2020-03-04 LAB — LIPID PANEL
Cholesterol: 185 mg/dL (ref 0–200)
HDL: 64.3 mg/dL (ref >39.00)
LDL Cholesterol: 111 mg/dL — ABNORMAL HIGH (ref 0–99)
NonHDL: 120.94
Total CHOL/HDL Ratio: 3
Triglycerides: 51 mg/dL (ref 0.0–149.0)
VLDL: 10.2 mg/dL (ref 0.0–40.0)

## 2020-03-04 LAB — HEMOGLOBIN A1C: Hgb A1c MFr Bld: 5.8 % (ref 4.6–6.5)

## 2020-03-04 MED ORDER — LOSARTAN POTASSIUM 25 MG PO TABS: 25.0000 mg | ORAL_TABLET | Freq: Every day | ORAL | 0 refills | Status: DC

## 2020-03-04 NOTE — Patient Instructions (Signed)
Carbohydrate Counting for Diabetes Mellitus, Adult  Carbohydrate counting is a method of keeping track of how many carbohydrates you eat. Eating carbohydrates naturally increases the amount of sugar (glucose) in the blood. Counting how many carbohydrates you eat helps keep your blood glucose within normal limits, which helps you manage your diabetes (diabetes mellitus). It is important to know how many carbohydrates you can safely have in each meal. This is different for every person. A diet and nutrition specialist (registered dietitian) can help you make a meal plan and calculate how many carbohydrates you should have at each meal and snack. Carbohydrates are found in the following foods:  Grains, such as breads and cereals.  Dried beans and soy products.  Starchy vegetables, such as potatoes, peas, and corn.  Fruit and fruit juices.  Milk and yogurt.  Sweets and snack foods, such as cake, cookies, candy, chips, and soft drinks. How do I count carbohydrates? There are two ways to count carbohydrates in food. You can use either of the methods or a combination of both. Reading "Nutrition Facts" on packaged food The "Nutrition Facts" list is included on the labels of almost all packaged foods and beverages in the [place] It includes:  The serving size.  Information about nutrients in each serving, including the grams (g) of carbohydrate per serving. To use the "Nutrition Facts":  Decide how many servings you will have.  Multiply the number of servings by the number of carbohydrates per serving.  The resulting number is the total amount of carbohydrates that you will be having. Learning standard serving sizes of other foods When you eat carbohydrate foods that are not packaged or do not include "Nutrition Facts" on the label, you need to measure the servings in order to count the amount of carbohydrates:  Measure the foods that you will eat with a food scale or measuring cup, if  needed.  Decide how many standard-size servings you will eat.  Multiply the number of servings by 15. Most carbohydrate-rich foods have about 15 g of carbohydrates per serving. ? For example, if you eat 8 oz (170 g) of strawberries, you will have eaten 2 servings and 30 g of carbohydrates (2 servings x 15 g = 30 g).  For foods that have more than one food mixed, such as soups and casseroles, you must count the carbohydrates in each food that is included. The following list contains standard serving sizes of common carbohydrate-rich foods. Each of these servings has about 15 g of carbohydrates:   hamburger bun or  English muffin.   oz (15 mL) syrup.   oz (14 g) jelly.  1 slice of bread.  1 six-inch tortilla.  3 oz (85 g) cooked rice or pasta.  4 oz (113 g) cooked dried beans.  4 oz (113 g) starchy vegetable, such as peas, corn, or potatoes.  4 oz (113 g) hot cereal.  4 oz (113 g) mashed potatoes or  of a large baked potato.  4 oz (113 g) canned or frozen fruit.  4 oz (120 mL) fruit juice.  4-6 crackers.  6 chicken nuggets.  6 oz (170 g) unsweetened dry cereal.  6 oz (170 g) plain fat-free yogurt or yogurt sweetened with artificial sweeteners.  8 oz (240 mL) milk.  8 oz (170 g) fresh fruit or one small piece of fruit.  24 oz (680 g) popped popcorn. Example of carbohydrate counting Sample meal  3 oz (85 g) chicken breast.  6 oz (170 g)   brown rice.  4 oz (113 g) corn.  8 oz (240 mL) milk.  8 oz (170 g) strawberries with sugar-free whipped topping. Carbohydrate calculation 1. Identify the foods that contain carbohydrates: ? Rice. ? Corn. ? Milk. ? Strawberries. 2. Calculate how many servings you have of each food: ? 2 servings rice. ? 1 serving corn. ? 1 serving milk. ? 1 serving strawberries. 3. Multiply each number of servings by 15 g: ? 2 servings rice x 15 g = 30 g. ? 1 serving corn x 15 g = 15 g. ? 1 serving milk x 15 g = 15 g. ? 1  serving strawberries x 15 g = 15 g. 4. Add together all of the amounts to find the total grams of carbohydrates eaten: ? 30 g + 15 g + 15 g + 15 g = 75 g of carbohydrates total. Summary  Carbohydrate counting is a method of keeping track of how many carbohydrates you eat.  Eating carbohydrates naturally increases the amount of sugar (glucose) in the blood.  Counting how many carbohydrates you eat helps keep your blood glucose within normal limits, which helps you manage your diabetes.  A diet and nutrition specialist (registered dietitian) can help you make a meal plan and calculate how many carbohydrates you should have at each meal and snack. This information is not intended to replace advice given to you by your health care provider. Make sure you discuss any questions you have with your health care provider. Document Revised: 03/15/2017 Document Reviewed: 02/02/2016 Elsevier Patient Education  2020 Elsevier Inc.  

## 2020-03-04 NOTE — Assessment & Plan Note (Signed)
Encouraged weight loss Continue CPAP

## 2020-03-04 NOTE — Assessment & Plan Note (Signed)
Encouraged weight loss Continue Tylenol Arthritis, knee injections and ortho follow up

## 2020-03-04 NOTE — Assessment & Plan Note (Signed)
CBC, CMET, Lipid and A1C today No urine microalbumin secondary to ARB therapy Encouraged him to consume a low carb diet, exercise for weight loss Encouraged him to schedule an eye exam Encouraged routine foot exams He declines flu and pneumovax Covid UTD

## 2020-03-04 NOTE — Progress Notes (Signed)
Subjective:    Patient ID: Devon Rios, male    DOB: March 05, 1966, 54 y.o.   MRN: [id number]  HPI  Pt presents to the clinic today for follow up of chronic conditions.  HTN: His BP today is 168/102. He was recently prescribed Losartan which he has been taking but he ran out 1 week ago. He had been taking a lot of NSAID's which he stopped and started taking Tylenol Arthritis. ECG from 11/2016 reviewed.  DM2: His last A1C was 6%, 09/2018. He is not taking any oral diabetic medication at this time. He sugars range 109-167. He checks his feet routinely. His last eye exam was more than 1 year ago. Flu never. Pneumovax never. Covid- J&J 12/09/2019  OSA: He averages 6 hours of sleep per night with the use of his CPAP. He no longer needs this after weight loss with bariatric surgery.  OA: He is following with orthopedics, considering knee injections. He takes Tramadol as needed with good relief.  Review of Systems  Past Medical History:  Diagnosis Date  . Arthritis    oa left knee worse than right  . DM2 (diabetes mellitus, type 2) ([place]) 07/21/2014   H/O HAD GASTRIC SLEEVE IN 2018-LOST 150 LBS AND NOW IS UNDER CONTROL WITH DIET AND EXERCISE  . Frequent headaches    weekly  . Hypertension    H/O BEEN OF BP MEDS 06-2018 DUE TO CONTROL -PT HAD GASTRIC SLEEVE SURGERY AND LOST 150 LBS AND NOW ITS CONTROLLED WITH DIET AND EXERCISE  . OSA on CPAP 2013  . Severe obesity (BMI >= 40) ([place]) 07/14/2014    Current Outpatient Medications  Medication Sig Dispense Refill  . Blood Glucose Monitoring Suppl (BAYER CONTOUR MONITOR) w/Device KIT Check blood sugar twice a day and as directed Dx E11.9 1 kit 0  . Calcium 500 MG CHEW Chew 1 each by mouth 2 (two) times daily.     Marland Kitchen ibuprofen (ADVIL,MOTRIN) 800 MG tablet Take 1 tablet (800 mg total) by mouth every 8 (eight) hours as needed for mild pain or moderate pain. 30 tablet 0  . Menthol-Methyl Salicylate (ICY HOT EXTRA STRENGTH EX) Apply 1 application  topically daily as needed (pain).    . Multiple Vitamins-Minerals (CELEBRATE MULTI-COMPLETE 45) CAPS Take 1 each by mouth 2 (two) times daily.     [name] DELICA LANCETS 65L MISC 1 each by Does not apply route 2 (two) times daily. Dx E11.9 100 each 6  . traMADol (ULTRAM) 50 MG tablet Take 50 mg by mouth every 8 (eight) hours as needed (pain).     No current facility-administered medications for this visit.    No Known Allergies  Family History  Problem Relation Age of Onset  . Hypertension Mother   . Diabetes Mother   . Cancer Maternal Grandmother        Lung  . Stroke Neg Hx     Social History   Socioeconomic History  . Marital status: Married    Spouse name: Not on file  . Number of children: Not on file  . Years of education: Not on file  . Highest education level: Not on file  Occupational History  . Not on file  Tobacco Use  . Smoking status: Never Smoker  . Smokeless tobacco: Never Used  Vaping Use  . Vaping Use: Never used  Substance and Sexual Activity  . Alcohol use: Not Currently    Alcohol/week: 0.0 standard drinks  . Drug use: No  .  Sexual activity: Yes  Other Topics Concern  . Not on file  Social History Narrative  . Not on file   Social Determinants of Health   Financial Resource Strain:   . Difficulty of Paying Living Expenses:   Food Insecurity:   . Worried About Charity fundraiser in the Last Year:   . Arboriculturist in the Last Year:   Transportation Needs:   . Film/video editor (Medical):   Marland Kitchen Lack of Transportation (Non-Medical):   Physical Activity:   . Days of Exercise per Week:   . Minutes of Exercise per Session:   Stress:   . Feeling of Stress :   Social Connections:   . Frequency of Communication with Friends and Family:   . Frequency of Social Gatherings with Friends and Family:   . Attends Religious Services:   . Active Member of Clubs or Organizations:   . Attends Archivist Meetings:   Marland Kitchen Marital Status:    Intimate Partner Violence:   . Fear of Current or Ex-Partner:   . Emotionally Abused:   Marland Kitchen Physically Abused:   . Sexually Abused:      Constitutional: Denies fever, malaise, fatigue, headache or abrupt weight changes.  HEENT: Denies eye pain, eye redness, ear pain, ringing in the ears, wax buildup, runny nose, nasal congestion, bloody nose, or sore throat. Respiratory: Denies difficulty breathing, shortness of breath, cough or sputum production.   Cardiovascular: Denies chest pain, chest tightness, palpitations or swelling in the hands or feet.  Gastrointestinal: Denies abdominal pain, bloating, constipation, diarrhea or blood in the stool.  GU: Denies urgency, frequency, pain with urination, burning sensation, blood in urine, odor or discharge. Musculoskeletal: Pt reports knee pain. Denies decrease in range of motion, difficulty with gait, muscle pain or joint swelling.  Skin: Denies redness, rashes, lesions or ulcercations.  Neurological: Denies dizziness, difficulty with memory, difficulty with speech or problems with balance and coordination.  Psych: Denies anxiety, depression, SI/HI.  No other specific complaints in a complete review of systems (except as listed in HPI above).     Objective:   Physical Exam  BP (!) 168/102   Pulse 92   Temp 98 F (36.7 C) (Temporal)   Wt 277 lb (125.6 kg)   SpO2 98%   BMI 43.38 kg/m   Wt Readings from Last 3 Encounters:  09/30/18 233 lb 9.6 oz (106 kg)  09/24/18 229 lb 4.5 oz (104 kg)  09/12/18 230 lb 4 oz (104.4 kg)    General: Appears his stated age, obese, in NAD. Skin: Warm, dry and intact. No ulcerations noted. Cardiovascular: Normal rate and rhythm. S1,S2 noted.  No murmur, rubs or gallops noted. No JVD or BLE edema.  Pulmonary/Chest: Normal effort and positive vesicular breath sounds. No respiratory distress. No wheezes, rales or ronchi noted.  Musculoskeletal:  No difficulty with gait.  Neurological: Alert and oriented.     BMET    Component Value Date/Time   NA 139 09/12/2018 1650   K 5.1 09/12/2018 1650   CL 102 09/12/2018 1650   CO2 30 09/12/2018 1650   GLUCOSE 80 09/12/2018 1650   BUN 23 09/12/2018 1650   CREATININE 1.24 09/12/2018 1650   CALCIUM 10.2 09/12/2018 1650   GFRNONAA 49 (L) 06/11/2017 1801   GFRAA 56 (L) 06/11/2017 1801    Lipid Panel     Component Value Date/Time   CHOL 119 09/10/2017 0836   TRIG 66.0 09/10/2017 0836   HDL  39.40 09/10/2017 0836   CHOLHDL 3 09/10/2017 0836   VLDL 13.2 09/10/2017 0836   LDLCALC 66 09/10/2017 0836    CBC    Component Value Date/Time   WBC 4.9 09/10/2017 0836   RBC 4.95 09/10/2017 0836   HGB 13.8 09/10/2017 0836   HCT 42.8 09/10/2017 0836   PLT 200.0 09/10/2017 0836   MCV 86.6 09/10/2017 0836   MCH 27.7 06/12/2017 0552   MCHC 32.2 09/10/2017 0836   RDW 15.9 (H) 09/10/2017 0836   LYMPHSABS 2.1 09/10/2017 0836   MONOABS 0.4 09/10/2017 0836   EOSABS 0.2 09/10/2017 0836   BASOSABS 0.0 09/10/2017 0836    Hgb A1C Lab Results  Component Value Date   HGBA1C 6.0 09/12/2018            Assessment & Plan:     Webb Silversmith, NP This visit occurred during the SARS-CoV-2 public health emergency.  Safety protocols were in place, including screening questions prior to the visit, additional usage of staff PPE, and extensive cleaning of exam room while observing appropriate contact time as indicated for disinfecting solutions.

## 2020-03-04 NOTE — Assessment & Plan Note (Signed)
Deteriorated Losartan refilled CBC and CMET today Reinforced DASH diet and exercise for weight loss  RTC in 2 weeks for nurse visit BP check or physical

## 2020-03-25 ENCOUNTER — Other Ambulatory Visit: Payer: Self-pay

## 2020-03-25 ENCOUNTER — Ambulatory Visit: Payer: PRIVATE HEALTH INSURANCE

## 2020-03-25 VITALS — BP 174/108 | HR 74

## 2020-03-25 DIAGNOSIS — I10 Essential (primary) hypertension: Secondary | ICD-10-CM

## 2020-03-25 NOTE — Progress Notes (Signed)
Pt in today for BP check. Pt started losartan 2 wks and has been taking as directed.   Lawernce Pitts of reading while pt was in office. She advised to send her a note and Threasa Beards would contact pt with further instructions.

## 2020-03-25 NOTE — Progress Notes (Signed)
Need to increase Losartan to 50 mg. Can take 2 25 mg until he runs out. Follow up with me in 1 week.

## 2020-03-26 ENCOUNTER — Encounter: Payer: Self-pay | Admitting: Internal Medicine

## 2020-04-01 ENCOUNTER — Ambulatory Visit: Payer: PRIVATE HEALTH INSURANCE | Admitting: Internal Medicine

## 2020-04-01 ENCOUNTER — Encounter: Payer: Self-pay | Admitting: Internal Medicine

## 2020-04-01 ENCOUNTER — Other Ambulatory Visit: Payer: Self-pay

## 2020-04-01 NOTE — Progress Notes (Signed)
Erroneous encounter

## 2020-04-15 ENCOUNTER — Other Ambulatory Visit: Payer: Self-pay

## 2020-04-15 ENCOUNTER — Encounter: Payer: Self-pay | Admitting: Internal Medicine

## 2020-04-15 ENCOUNTER — Ambulatory Visit (INDEPENDENT_AMBULATORY_CARE_PROVIDER_SITE_OTHER): Payer: PRIVATE HEALTH INSURANCE | Admitting: Internal Medicine

## 2020-04-15 VITALS — BP 130/90 | HR 74 | Temp 97.8°F | Ht 65.25 in | Wt 269.0 lb

## 2020-04-15 DIAGNOSIS — I10 Essential (primary) hypertension: Secondary | ICD-10-CM

## 2020-04-15 DIAGNOSIS — Z1211 Encounter for screening for malignant neoplasm of colon: Secondary | ICD-10-CM | POA: Diagnosis not present

## 2020-04-15 DIAGNOSIS — Z0000 Encounter for general adult medical examination without abnormal findings: Secondary | ICD-10-CM

## 2020-04-15 MED ORDER — LOSARTAN POTASSIUM 50 MG PO TABS: 50.0000 mg | ORAL_TABLET | Freq: Every day | ORAL | 1 refills | Status: DC

## 2020-04-15 NOTE — Assessment & Plan Note (Signed)
Controlled on Losartan 50 mg, refilled today Reinforced DASH diet and exercise for weight loss

## 2020-04-15 NOTE — Progress Notes (Signed)
Subjective:    Patient ID: Devon Rios, male    DOB: December 25, 1965, 54 y.o.   MRN: [id number]  HPI  Patient presents the clinic today for his annual exam.  He is also due to follow-up HTN.  At his last visit, his losartan was increased to 50 mg.  He has been taking the medication as prescribed.  His BP today is 130/90.  Flu: never Tetanus: 10/2007 Pneumovax: Never Covid: Ranelle Oyster PSA screening: 09/2017 Colon screening: 2010 Vision screening: as needed Dentist: as needed  Diet: He does eat meat. He consume fruits and veggies daily. He tries to avoid fried foods. He drinks mostly water. Exercise: Walking, bicycle 16 miles daily  Review of Systems      Past Medical History:  Diagnosis Date  . Arthritis    oa left knee worse than right  . DM2 (diabetes mellitus, type 2) ([place]) 07/21/2014   H/O HAD GASTRIC SLEEVE IN 2018-LOST 150 LBS AND NOW IS UNDER CONTROL WITH DIET AND EXERCISE  . Frequent headaches    weekly  . Hypertension    H/O BEEN OF BP MEDS 06-2018 DUE TO CONTROL -PT HAD GASTRIC SLEEVE SURGERY AND LOST 150 LBS AND NOW ITS CONTROLLED WITH DIET AND EXERCISE  . OSA on CPAP 2013  . Severe obesity (BMI >= 40) ([place]) 07/14/2014    Current Outpatient Medications  Medication Sig Dispense Refill  . Acetaminophen (TYLENOL ARTHRITIS PAIN PO) Take by mouth.    . Blood Glucose Monitoring Suppl (BAYER CONTOUR MONITOR) w/Device KIT Check blood sugar twice a day and as directed Dx E11.9 1 kit 0  . Calcium 500 MG CHEW Chew 1 each by mouth 2 (two) times daily.     . diclofenac Sodium (VOLTAREN) 1 % GEL Apply topically 4 (four) times daily.    Marland Kitchen losartan (COZAAR) 25 MG tablet Take 2 tablets (50 mg total) by mouth daily. 90 tablet 0  . Multiple Vitamins-Minerals (CELEBRATE MULTI-COMPLETE 45) CAPS Take 1 each by mouth 2 (two) times daily.     [name] DELICA LANCETS 35T MISC 1 each by Does not apply route 2 (two) times daily. Dx E11.9 100 each 6   No current facility-administered  medications for this visit.    No Known Allergies  Family History  Problem Relation Age of Onset  . Hypertension Mother   . Diabetes Mother   . Cancer Maternal Grandmother        Lung  . Stroke Neg Hx     Social History   Socioeconomic History  . Marital status: Married    Spouse name: Not on file  . Number of children: Not on file  . Years of education: Not on file  . Highest education level: Not on file  Occupational History  . Not on file  Tobacco Use  . Smoking status: Never Smoker  . Smokeless tobacco: Never Used  Vaping Use  . Vaping Use: Never used  Substance and Sexual Activity  . Alcohol use: Not Currently    Alcohol/week: 0.0 standard drinks  . Drug use: No  . Sexual activity: Yes  Other Topics Concern  . Not on file  Social History Narrative  . Not on file   Social Determinants of Health   Financial Resource Strain:   . Difficulty of Paying Living Expenses:   Food Insecurity:   . Worried About Charity fundraiser in the Last Year:   . Arboriculturist in the Last Year:   [employer]  Needs:   . Lack of Transportation (Medical):   Marland Kitchen Lack of Transportation (Non-Medical):   Physical Activity:   . Days of Exercise per Week:   . Minutes of Exercise per Session:   Stress:   . Feeling of Stress :   Social Connections:   . Frequency of Communication with Friends and Family:   . Frequency of Social Gatherings with Friends and Family:   . Attends Religious Services:   . Active Member of Clubs or Organizations:   . Attends Archivist Meetings:   Marland Kitchen Marital Status:   Intimate Partner Violence:   . Fear of Current or Ex-Partner:   . Emotionally Abused:   Marland Kitchen Physically Abused:   . Sexually Abused:      Constitutional: Denies fever, malaise, fatigue, headache or abrupt weight changes.  HEENT: Denies eye pain, eye redness, ear pain, ringing in the ears, wax buildup, runny nose, nasal congestion, bloody nose, or sore throat. Respiratory:  Denies difficulty breathing, shortness of breath, cough or sputum production.   Cardiovascular: Denies chest pain, chest tightness, palpitations or swelling in the hands or feet.  Gastrointestinal: Denies abdominal pain, bloating, constipation, diarrhea or blood in the stool.  GU: Denies urgency, frequency, pain with urination, burning sensation, blood in urine, odor or discharge. Musculoskeletal: Pt reports intermittent knee pain. Denies decrease in range of motion, difficulty with gait, muscle pain or joint swelling.  Skin: Denies redness, rashes, lesions or ulcercations.  Neurological: Denies dizziness, difficulty with memory, difficulty with speech or problems with balance and coordination.  Psych: Denies anxiety, depression, SI/HI.  No other specific complaints in a complete review of systems (except as listed in HPI above).  Objective:   Physical Exam  BP 130/90   Pulse 74   Temp 97.8 F (36.6 C) (Temporal)   Ht 5' 5.25" (1.657 m)   Wt 269 lb (122 kg)   SpO2 98%   BMI 44.42 kg/m   Wt Readings from Last 3 Encounters:  04/01/20 (!) 275 lb (124.7 kg)  03/04/20 277 lb (125.6 kg)  09/30/18 233 lb 9.6 oz (106 kg)    General: Appears his stated age, obese, in NAD. Skin: Warm, dry and intact. Noulcerations noted. HEENT: Head: normal shape and size; Eyes: sclera white, no icterus, conjunctiva pink, PERRLA and EOMs intact;  Neck:  Neck supple, trachea midline. No masses, lumps or thyromegaly present.  Cardiovascular: Normal rate and rhythm. S1,S2 noted.  No murmur, rubs or gallops noted. No JVD or BLE edema. No carotid bruits noted. Pulmonary/Chest: Normal effort and positive vesicular breath sounds. No respiratory distress. No wheezes, rales or ronchi noted.  Abdomen: Soft and nontender. Normal bowel sounds. No distention or masses noted. Liver, spleen and kidneys non palpable. Musculoskeletal: Strength 5/5 BUE/BLE. No difficulty with gait.  Neurological: Alert and oriented.  Cranial nerves II-XII grossly intact. Coordination normal.  Psychiatric: Mood and affect normal. Behavior is normal. Judgment and thought content normal.     BMET    Component Value Date/Time   NA 141 03/04/2020 1044   K 4.7 03/04/2020 1044   CL 103 03/04/2020 1044   CO2 30 03/04/2020 1044   GLUCOSE 89 03/04/2020 1044   BUN 22 03/04/2020 1044   CREATININE 1.45 03/04/2020 1044   CALCIUM 10.5 03/04/2020 1044   GFRNONAA 49 (L) 06/11/2017 1801   GFRAA 56 (L) 06/11/2017 1801    Lipid Panel     Component Value Date/Time   CHOL 185 03/04/2020 1044   TRIG 51.0  03/04/2020 1044   HDL 64.30 03/04/2020 1044   CHOLHDL 3 03/04/2020 1044   VLDL 10.2 03/04/2020 1044   LDLCALC 111 (H) 03/04/2020 1044    CBC    Component Value Date/Time   WBC 6.4 03/04/2020 1044   RBC 5.07 03/04/2020 1044   HGB 15.0 03/04/2020 1044   HCT 44.9 03/04/2020 1044   PLT 160.0 03/04/2020 1044   MCV 88.4 03/04/2020 1044   MCH 27.7 06/12/2017 0552   MCHC 33.5 03/04/2020 1044   RDW 14.5 03/04/2020 1044   LYMPHSABS 2.1 09/10/2017 0836   MONOABS 0.4 09/10/2017 0836   EOSABS 0.2 09/10/2017 0836   BASOSABS 0.0 09/10/2017 0836    Hgb A1C Lab Results  Component Value Date   HGBA1C 5.8 03/04/2020           Assessment & Plan:   Preventative Health Maintenance  Encouraged him to get a flu shot in the fall Tetanus UTD He declines Pneumovax Covid UTD Referral to GI for screening colonoscopy Encouraged him to consume a balanced diet and exercise regimen Advised him to see an eye doctor and dentist annually Labs from 03/2019 reviewed.  Will check PSA at next annual exam.  RTC in 3 months, lab only A1C, 6 months, follow-up chronic conditions Webb Silversmith, NP This visit occurred during the SARS-CoV-2 public health emergency.  Safety protocols were in place, including screening questions prior to the visit, additional usage of staff PPE, and extensive cleaning of exam room while observing appropriate  contact time as indicated for disinfecting solutions.

## 2020-04-15 NOTE — Patient Instructions (Signed)

## 2020-04-19 ENCOUNTER — Telehealth (INDEPENDENT_AMBULATORY_CARE_PROVIDER_SITE_OTHER): Payer: Self-pay | Admitting: Gastroenterology

## 2020-04-19 ENCOUNTER — Other Ambulatory Visit: Payer: Self-pay

## 2020-04-19 DIAGNOSIS — Z1211 Encounter for screening for malignant neoplasm of colon: Secondary | ICD-10-CM

## 2020-04-19 MED ORDER — NA SULFATE-K SULFATE-MG SULF 17.5-3.13-1.6 GM/177ML PO SOLN: 1.0000 | Freq: Once | ORAL | 0 refills | Status: AC

## 2020-04-19 NOTE — Progress Notes (Signed)
Gastroenterology Pre-Procedure Review  Request Date: Thursday 05/13/20 Requesting Physician: Dr. Vicente Males  PATIENT REVIEW QUESTIONS: The patient responded to the following health history questions as indicated:    1. Are you having any GI issues? no 2. Do you have a personal history of Polyps? no 3. Do you have a family history of Colon Cancer or Polyps? patient states no, however he is unsure of his family history 4. Diabetes Mellitus? pre-diabetic no meds 5. Joint replacements in the past 12 months?no 6. Major health problems in the past 3 months?no major health problems.  Patient had a gastric sleeve procedure 3 years ago. Cyst removal earlier this year from his neck. 7. Any artificial heart valves, MVP, or defibrillator?no    MEDICATIONS & ALLERGIES:    Patient reports the following regarding taking any anticoagulation/antiplatelet therapy:   Plavix, Coumadin, Eliquis, Xarelto, Lovenox, Pradaxa, Brilinta, or Effient? no Aspirin? no  Patient confirms/reports the following medications:  Current Outpatient Medications  Medication Sig Dispense Refill  . Acetaminophen (TYLENOL ARTHRITIS PAIN PO) Take by mouth.    . Blood Glucose Monitoring Suppl (BAYER CONTOUR MONITOR) w/Device KIT Check blood sugar twice a day and as directed Dx E11.9 1 kit 0  . Calcium 500 MG CHEW Chew 1 each by mouth 2 (two) times daily.     . diclofenac Sodium (VOLTAREN) 1 % GEL Apply topically 4 (four) times daily.    Marland Kitchen losartan (COZAAR) 50 MG tablet Take 1 tablet (50 mg total) by mouth daily. 90 tablet 1  . Misc Natural Products (COLON CLEANSE PO) Take by mouth. As Needed    . Multiple Vitamins-Minerals (CELEBRATE MULTI-COMPLETE 45) CAPS Take 1 each by mouth 2 (two) times daily.     [name] DELICA LANCETS 22Q MISC 1 each by Does not apply route 2 (two) times daily. Dx E11.9 100 each 6   No current facility-administered medications for this visit.    Patient confirms/reports the following allergies:  No Known  Allergies  No orders of the defined types were placed in this encounter.   AUTHORIZATION INFORMATION Primary Insurance: 1D#: Group #:  Secondary Insurance: 1D#: Group #:  SCHEDULE INFORMATION: Date: Thursday 05/13/20 Time: Location:[facility]

## 2020-05-11 ENCOUNTER — Other Ambulatory Visit
Admission: RE | Admit: 2020-05-11 | Discharge: 2020-05-11 | Disposition: A | Discharge status: Home / Self Care | Payer: 59 | Source: Ambulatory Visit | Attending: Gastroenterology | Admitting: Gastroenterology

## 2020-05-11 ENCOUNTER — Other Ambulatory Visit: Payer: Self-pay

## 2020-05-11 DIAGNOSIS — Z20822 Contact with and (suspected) exposure to covid-19: Secondary | ICD-10-CM | POA: Diagnosis not present

## 2020-05-11 DIAGNOSIS — Z01812 Encounter for preprocedural laboratory examination: Secondary | ICD-10-CM | POA: Insufficient documentation

## 2020-05-12 LAB — SARS CORONAVIRUS 2 (TAT 6-24 HRS): SARS Coronavirus 2: NEGATIVE

## 2020-05-13 ENCOUNTER — Ambulatory Visit: Payer: 59 | Admitting: Certified Registered"

## 2020-05-13 ENCOUNTER — Ambulatory Visit
Admission: RE | Admit: 2020-05-13 | Discharge: 2020-05-13 | Disposition: A | Discharge status: Home / Self Care | Payer: 59 | Attending: Gastroenterology | Admitting: Gastroenterology

## 2020-05-13 ENCOUNTER — Other Ambulatory Visit: Payer: Self-pay

## 2020-05-13 ENCOUNTER — Encounter: Payer: Self-pay | Admitting: Gastroenterology

## 2020-05-13 ENCOUNTER — Encounter
Admission: RE | Disposition: A | Discharge status: Home / Self Care | Payer: Self-pay | Source: Home / Self Care | Attending: Gastroenterology

## 2020-05-13 DIAGNOSIS — Z6841 Body Mass Index (BMI) 40.0 and over, adult: Secondary | ICD-10-CM | POA: Diagnosis not present

## 2020-05-13 DIAGNOSIS — Z1211 Encounter for screening for malignant neoplasm of colon: Secondary | ICD-10-CM

## 2020-05-13 DIAGNOSIS — I10 Essential (primary) hypertension: Secondary | ICD-10-CM | POA: Diagnosis not present

## 2020-05-13 HISTORY — PX: COLONOSCOPY WITH PROPOFOL: SHX5780

## 2020-05-13 SURGERY — COLONOSCOPY WITH PROPOFOL
Anesthesia: General

## 2020-05-13 MED ORDER — MIDAZOLAM HCL 5 MG/5ML IJ SOLN
INTRAMUSCULAR | Status: DC | PRN
  Administered 2020-05-13: 2 mg via INTRAVENOUS

## 2020-05-13 MED ORDER — LIDOCAINE HCL (PF) 2 % IJ SOLN
INTRAMUSCULAR | Status: AC
  Filled 2020-05-13: qty 5

## 2020-05-13 MED ORDER — PROPOFOL 10 MG/ML IV BOLUS
INTRAVENOUS | Status: DC | PRN
  Administered 2020-05-13: 100 mg via INTRAVENOUS

## 2020-05-13 MED ORDER — GLYCOPYRROLATE 0.2 MG/ML IJ SOLN
INTRAMUSCULAR | Status: DC | PRN
  Administered 2020-05-13: .2 mg via INTRAVENOUS

## 2020-05-13 MED ORDER — PROPOFOL 500 MG/50ML IV EMUL
INTRAVENOUS | Status: AC
  Filled 2020-05-13: qty 100

## 2020-05-13 MED ORDER — SODIUM CHLORIDE 0.9 % IV SOLN: INTRAVENOUS | Status: DC

## 2020-05-13 MED ORDER — GLYCOPYRROLATE 0.2 MG/ML IJ SOLN
INTRAMUSCULAR | Status: AC
  Filled 2020-05-13: qty 1

## 2020-05-13 MED ORDER — MIDAZOLAM HCL 2 MG/2ML IJ SOLN
INTRAMUSCULAR | Status: AC
  Filled 2020-05-13: qty 2

## 2020-05-13 MED ORDER — PROPOFOL 500 MG/50ML IV EMUL
INTRAVENOUS | Status: DC | PRN
  Administered 2020-05-13: 120 ug/kg/min via INTRAVENOUS

## 2020-05-13 NOTE — Op Note (Addendum)
[facility] Gastroenterology Patient Name: Devon Rios Procedure Date: 05/13/2020 7:30 AM MRN: [id number] Account #: [id number] Date of Birth: 1966-04-03 Admit Type: Outpatient Age: 54 Room: [facility] ENDO ROOM 1 Gender: Male Note Status: Finalized Procedure:             Colonoscopy Indications:           Screening for colorectal malignant neoplasm Providers:             Jonathon Bellows MD, MD Referring MD:          Jearld Fenton (Referring MD) Medicines:             Monitored Anesthesia Care Complications:         No immediate complications. Procedure:             Pre-Anesthesia Assessment:                        - Prior to the procedure, a History and Physical was                         performed, and patient medications, allergies and                         sensitivities were reviewed. The patient's tolerance                         of previous anesthesia was reviewed.                        - The risks and benefits of the procedure and the                         sedation options and risks were discussed with the                         patient. All questions were answered and informed                         consent was obtained.                        - ASA Grade Assessment: II - A patient with mild                         systemic disease.                        After obtaining informed consent, the colonoscope was                         passed under direct vision. Throughout the procedure,                         the patient's blood pressure, pulse, and oxygen                         saturations were monitored continuously. The                         Colonoscope was introduced through the anus and  advanced to the the cecum, identified by the                         appendiceal orifice. The colonoscopy was performed                         with ease. The patient tolerated the procedure well.                         The quality of the  bowel preparation was excellent. Findings:      The perianal and digital rectal examinations were normal.      The entire examined colon appeared normal on direct and retroflexion       views. Impression:            - The entire examined colon is normal on direct and                         retroflexion views.                        - No specimens collected. Recommendation:        - Discharge patient to home (with escort).                        - Resume previous diet.                        - Continue present medications.                        - Repeat colonoscopy in 10 years for screening                         purposes. Procedure Code(s):     --- Professional ---                        [phone], Colonoscopy, flexible; diagnostic, including                         collection of specimen(s) by brushing or washing, when                         performed (separate procedure) Diagnosis Code(s):     --- Professional ---                        Z12.11, Encounter for screening for malignant neoplasm                         of colon CPT copyright 2019 American Medical Association. All rights reserved. The codes documented in this report are preliminary and upon coder review may  be revised to meet current compliance requirements. Jonathon Bellows, MD Jonathon Bellows MD, MD 05/13/2020 8:20:44 AM This report has been signed electronically. Number of Addenda: 0 Note Initiated On: 05/13/2020 7:30 AM Scope Withdrawal Time: 0 hours 8 minutes 40 seconds  Total Procedure Duration: 0 hours 11 minutes 1 second  Estimated Blood Loss:  Estimated blood loss: none.      [facility]

## 2020-05-13 NOTE — H&P (Signed)
Jonathon Bellows, MD [address] [place], [place], [place], [address] [address], [place], [place], [place], [address] Phone: [phone]  Fax: [phone]  Primary Care Physician:  Jearld Fenton, NP   Pre-Procedure History & Physical: HPI:  Devon Rios is a 54 y.o. male is here for an colonoscopy.   Past Medical History:  Diagnosis Date  . Arthritis    oa left knee worse than right  . DM2 (diabetes mellitus, type 2) ([place]) 07/21/2014   H/O HAD GASTRIC SLEEVE IN 2018-LOST 150 LBS AND NOW IS UNDER CONTROL WITH DIET AND EXERCISE  . Frequent headaches    weekly  . Hypertension    H/O BEEN OF BP MEDS 06-2018 DUE TO CONTROL -PT HAD GASTRIC SLEEVE SURGERY AND LOST 150 LBS AND NOW ITS CONTROLLED WITH DIET AND EXERCISE  . OSA on CPAP 2013  . Severe obesity (BMI >= 40) ([place]) 07/14/2014    Past Surgical History:  Procedure Laterality Date  . coloscopy  age 52  . INCISION AND DRAINAGE ABSCESS N/A 09/24/2018   Procedure: INCISION AND DRAINAGE OF CYST, POSTERIOR SCALP;  Surgeon: Benjamine Sprague, DO;  Location: [facility] ORS;  Service: General;  Laterality: N/A;  . LAPAROSCOPIC GASTRIC SLEEVE RESECTION N/A 06/11/2017   Procedure: LAPAROSCOPIC GASTRIC SLEEVE RESECTION WITH UPPER ENDO;  Surgeon: Johnathan Hausen, MD;  Location: [facility];  Service: General;  Laterality: N/A;  . left knee fluid drained and cortisone injection  03/2017  . MENISCUS REPAIR Right    Right knee  . VASECTOMY      Prior to Admission medications   Medication Sig Start Date End Date Taking? Authorizing Provider  Acetaminophen (TYLENOL ARTHRITIS PAIN PO) Take by mouth.   Yes [provider]  Blood Glucose Monitoring Suppl (BAYER CONTOUR MONITOR) w/Device KIT Check blood sugar twice a day and as directed Dx E11.9 06/13/17  Yes Baity, Coralie Keens, NP  Calcium 500 MG CHEW Chew 1 each by mouth 2 (two) times daily.    Yes [provider]  diclofenac Sodium (VOLTAREN) 1 % GEL Apply topically 4 (four) times  daily.   Yes [provider]  losartan (COZAAR) 50 MG tablet Take 1 tablet (50 mg total) by mouth daily. 04/15/20  Yes Baity, Coralie Keens, NP  Misc Natural Products (COLON CLEANSE PO) Take by mouth. As Needed   Yes [provider]  Multiple Vitamins-Minerals (CELEBRATE MULTI-COMPLETE 45) CAPS Take 1 each by mouth 2 (two) times daily.    Yes [provider]  [facility] DELICA LANCETS 66A MISC 1 each by Does not apply route 2 (two) times daily. Dx E11.9 11/05/14  Yes Jearld Fenton, NP    Allergies as of 04/19/2020  . (No Known Allergies)    Family History  Problem Relation Age of Onset  . Hypertension Mother   . Diabetes Mother   . Cancer Maternal Grandmother        Lung  . Stroke Neg Hx     Social History   Socioeconomic History  . Marital status: Married    Spouse name: Not on file  . Number of children: Not on file  . Years of education: Not on file  . Highest education level: Not on file  Occupational History  . Not on file  Tobacco Use  . Smoking status: Never Smoker  . Smokeless tobacco: Never Used  Vaping Use  . Vaping Use: Never used  Substance and Sexual Activity  . Alcohol use: Not Currently  Alcohol/week: 0.0 standard drinks  . Drug use: No  . Sexual activity: Yes  Other Topics Concern  . Not on file  Social History Narrative  . Not on file   Social Determinants of Health   Financial Resource Strain:   . Difficulty of Paying Living Expenses: Not on file  Food Insecurity:   . Worried About Charity fundraiser in the Last Year: Not on file  . Ran Out of Food in the Last Year: Not on file  Transportation Needs:   . Lack of Transportation (Medical): Not on file  . Lack of Transportation (Non-Medical): Not on file  Physical Activity:   . Days of Exercise per Week: Not on file  . Minutes of Exercise per Session: Not on file  Stress:   . Feeling of Stress : Not on file  Social Connections:   . Frequency of Communication with  Friends and Family: Not on file  . Frequency of Social Gatherings with Friends and Family: Not on file  . Attends Religious Services: Not on file  . Active Member of Clubs or Organizations: Not on file  . Attends Archivist Meetings: Not on file  . Marital Status: Not on file  Intimate Partner Violence:   . Fear of Current or Ex-Partner: Not on file  . Emotionally Abused: Not on file  . Physically Abused: Not on file  . Sexually Abused: Not on file    Review of Systems: See HPI, otherwise negative ROS  Physical Exam: BP (!) 172/94   Pulse 67   Temp (!) 96.6 F (35.9 C) (Temporal)   Resp 16   Ht '5\' 6"'  (1.676 m)   Wt 121.6 kg   SpO2 99%   BMI 43.26 kg/m  General:   Alert,  pleasant and cooperative in NAD Head:  Normocephalic and atraumatic. Neck:  Supple; no masses or thyromegaly. Lungs:  Clear throughout to auscultation, normal respiratory effort.    Heart:  +S1, +S2, Regular rate and rhythm, No edema. Abdomen:  Soft, nontender and nondistended. Normal bowel sounds, without guarding, and without rebound.   Neurologic:  Alert and  oriented x4;  grossly normal neurologically.  Impression/Plan: [name] is here for an colonoscopy to be performed for Screening colonoscopy average risk   Risks, benefits, limitations, and alternatives regarding  colonoscopy have been reviewed with the patient.  Questions have been answered.  All parties agreeable.   Jonathon Bellows, MD  05/13/2020, 8:02 AM

## 2020-05-13 NOTE — Anesthesia Postprocedure Evaluation (Signed)
Anesthesia Post Note  Patient: Devon Rios  Procedure(s) Performed: COLONOSCOPY WITH PROPOFOL (N/A )  Patient location during evaluation: Endoscopy Anesthesia Type: General Level of consciousness: awake and alert Pain management: pain level controlled Vital Signs Assessment: post-procedure vital signs reviewed and stable Respiratory status: spontaneous breathing, nonlabored ventilation and respiratory function stable Cardiovascular status: blood pressure returned to baseline and stable Postop Assessment: no apparent nausea or vomiting Anesthetic complications: no   No complications documented.   Last Vitals:  Vitals:   05/13/20 0825 05/13/20 0845  BP: 137/87 (!) 157/98  Pulse:    Resp:    Temp: (!) 35.9 C   SpO2:      Last Pain:  Vitals:   05/13/20 0845  TempSrc:   PainSc: 0-No pain                 Alphonsus Sias

## 2020-05-13 NOTE — Anesthesia Preprocedure Evaluation (Addendum)
Anesthesia Evaluation  Patient identified by MRN, date of birth, ID band Patient awake    Reviewed: Allergy & Precautions, H&P , NPO status , reviewed documented beta blocker date and time   Airway Mallampati: III  TM Distance: >3 FB Neck ROM: full    Dental  (+) Teeth Intact   Pulmonary sleep apnea and Continuous Positive Airway Pressure Ventilation ,    Pulmonary exam normal        Cardiovascular hypertension, Normal cardiovascular exam     Neuro/Psych  Headaches,    GI/Hepatic neg GERD  ,  Endo/Other  diabetes, Type 2Morbid obesity  Renal/GU      Musculoskeletal  (+) Arthritis ,   Abdominal   Peds  Hematology   Anesthesia Other Findings Past Medical History: No date: Arthritis     Comment:  oa left knee worse than right 07/21/2014: DM2 (diabetes mellitus, type 2) ([place])     Comment:  H/O HAD GASTRIC SLEEVE IN 2018-LOST 150 LBS AND NOW IS               UNDER CONTROL WITH DIET AND EXERCISE No date: Frequent headaches     Comment:  weekly No date: Hypertension     Comment:  H/O BEEN OF BP MEDS 06-2018 DUE TO CONTROL -PT HAD               GASTRIC SLEEVE SURGERY AND LOST 150 LBS AND NOW ITS               CONTROLLED WITH DIET AND EXERCISE 2013: OSA on CPAP 07/14/2014: Severe obesity (BMI >= 40) ([facility])  Past Surgical History: age 17: coloscopy 09/24/2018: INCISION AND DRAINAGE ABSCESS; N/A     Comment:  Procedure: INCISION AND DRAINAGE OF CYST, POSTERIOR               SCALP;  Surgeon: Benjamine Sprague, DO;  Location: [facility] ORS;                Service: General;  Laterality: N/A; 06/11/2017: LAPAROSCOPIC GASTRIC SLEEVE RESECTION; N/A     Comment:  Procedure: LAPAROSCOPIC GASTRIC SLEEVE RESECTION WITH               UPPER ENDO;  Surgeon: Johnathan Hausen, MD;  Location: [facility]               ORS;  Service: General;  Laterality: N/A; 03/2017: left knee fluid drained and cortisone injection No date: MENISCUS REPAIR; Right      Comment:  Right knee No date: VASECTOMY  BMI    Body Mass Index: 43.26 kg/m      Reproductive/Obstetrics                            Anesthesia Physical Anesthesia Plan  ASA: III  Anesthesia Plan: General   Post-op Pain Management:    Induction: Intravenous  PONV Risk Score and Plan: Treatment may vary due to age or medical condition and TIVA  Airway Management Planned: Nasal Cannula and Natural Airway  Additional Equipment:   Intra-op Plan:   Post-operative Plan:   Informed Consent: I have reviewed the patients History and Physical, chart, labs and discussed the procedure including the risks, benefits and alternatives for the proposed anesthesia with the patient or authorized representative who has indicated his/her understanding and acceptance.     Dental Advisory Given  Plan Discussed with: CRNA  Anesthesia Plan Comments:  Anesthesia Quick Evaluation  

## 2020-05-13 NOTE — Transfer of Care (Signed)
Immediate Anesthesia Transfer of Care Note  Patient: Devon Rios  Procedure(s) Performed: COLONOSCOPY WITH PROPOFOL (N/A )  Patient Location: Endoscopy Unit  Anesthesia Type:General  Level of Consciousness: sedated  Airway & Oxygen Therapy: Patient Spontanous Breathing and Patient connected to nasal cannula oxygen  Post-op Assessment: Report given to RN and Post -op Vital signs reviewed and stable  Post vital signs: Reviewed  Last Vitals:  Vitals Value Taken Time  BP    Temp    Pulse 72 05/13/20 0824  Resp 21 05/13/20 0824  SpO2 99 % 05/13/20 0824  Vitals shown include unvalidated device data.  Last Pain:  Vitals:   05/13/20 0707  TempSrc: Temporal  PainSc: 0-No pain         Complications: No complications documented.

## 2020-05-14 ENCOUNTER — Encounter: Payer: Self-pay | Admitting: Gastroenterology

## 2020-07-01 ENCOUNTER — Other Ambulatory Visit: Payer: Self-pay | Admitting: Internal Medicine

## 2020-07-01 DIAGNOSIS — E1165 Type 2 diabetes mellitus with hyperglycemia: Secondary | ICD-10-CM

## 2020-07-15 ENCOUNTER — Other Ambulatory Visit: Payer: PRIVATE HEALTH INSURANCE

## 2020-11-08 ENCOUNTER — Encounter: Payer: Self-pay | Admitting: Internal Medicine

## 2020-11-08 MED ORDER — LOSARTAN POTASSIUM 50 MG PO TABS: 50.0000 mg | ORAL_TABLET | Freq: Every day | ORAL | 1 refills | Status: DC

## 2020-12-31 ENCOUNTER — Encounter (HOSPITAL_COMMUNITY): Payer: Self-pay | Admitting: *Deleted

## 2020-12-31 IMAGING — CT CT NECK W/ CM
4 of 6 series · 13 of 35 positions shown, 15 images · IV contrast (iopamidol)
Comparison: None.

CLINICAL DATA: Palpable mass, midline posterior neck.

EXAM:
CT NECK WITH CONTRAST
TECHNIQUE: Multidetector CT imaging of the neck was performed using the
standard protocol following the bolus administration of intravenous
contrast.
CONTRAST:  75mL E31SRC-XJJ IOPAMIDOL (E31SRC-XJJ) INJECTION 61%

[Series 3: axial bone neck · axial · 0.59mm/px · z∈[-752,-662]mm · 2 of 137 slices shown]
[im 46/137  bone]
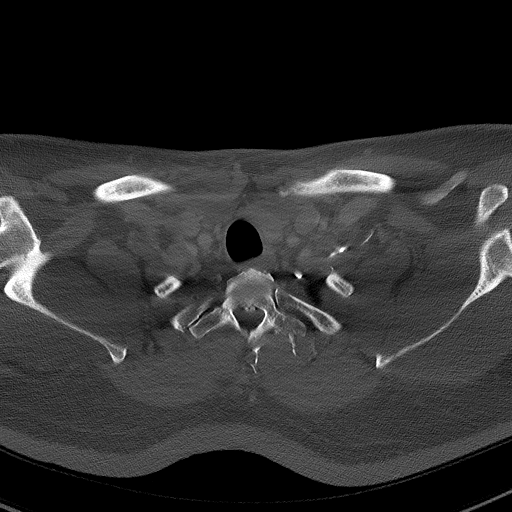
[im 91/137  bone]
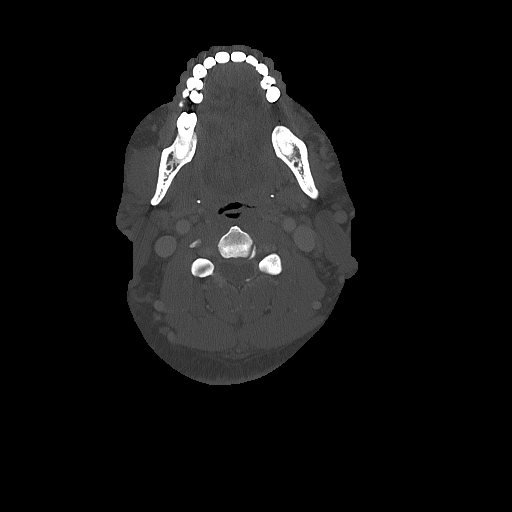

[Series 5: coronal neck neck (person_name) · coronal · 0.54mm/px · 3 of 140 slices shown]
[im 28/140  bone]
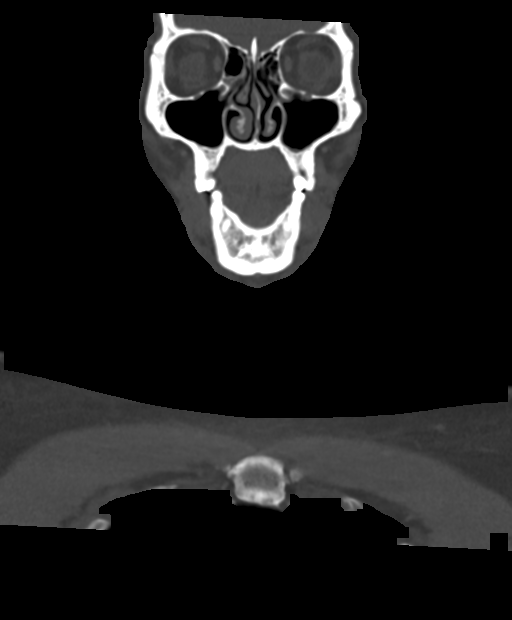
[im 56/140  bone]
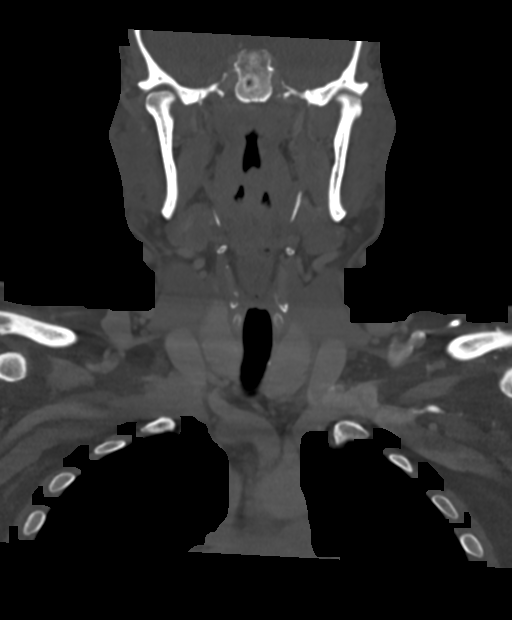
[im 84/140  bone]
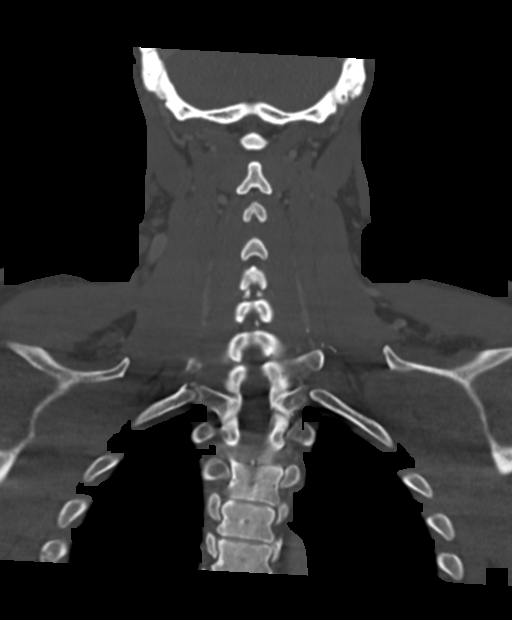

[Series 7: sagittal neck neck (person_name) · sagittal · 0.51mm/px · 5 of 138 slices shown, 6 images]
[im 46/138  bone]
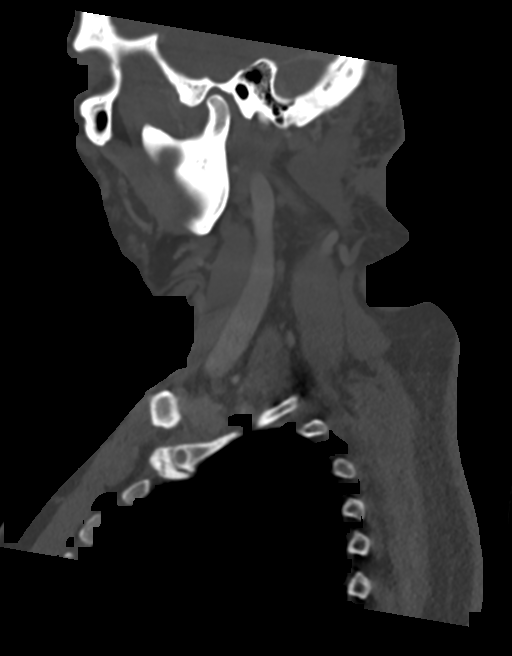
[im 58/138  bone]
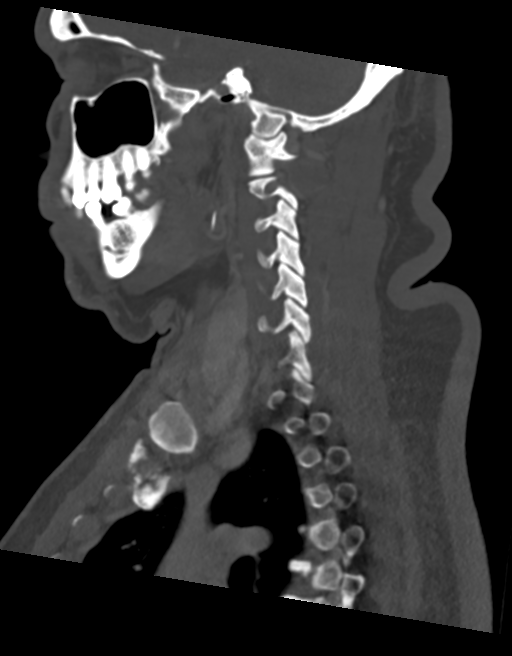
[im 69/138  soft-tissue]
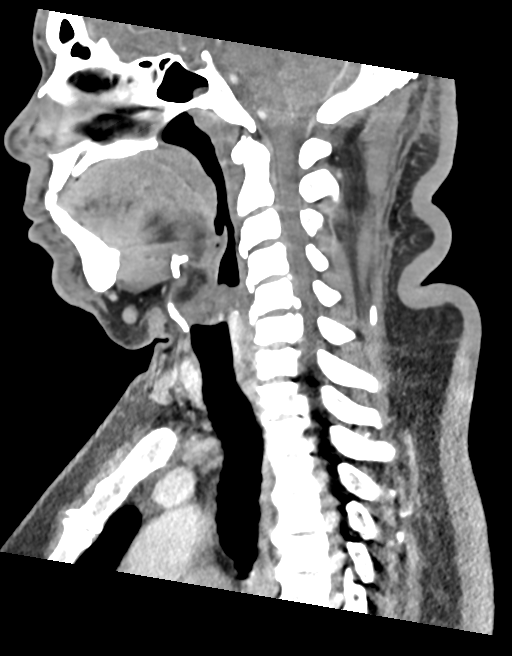
[im 69/138  bone]
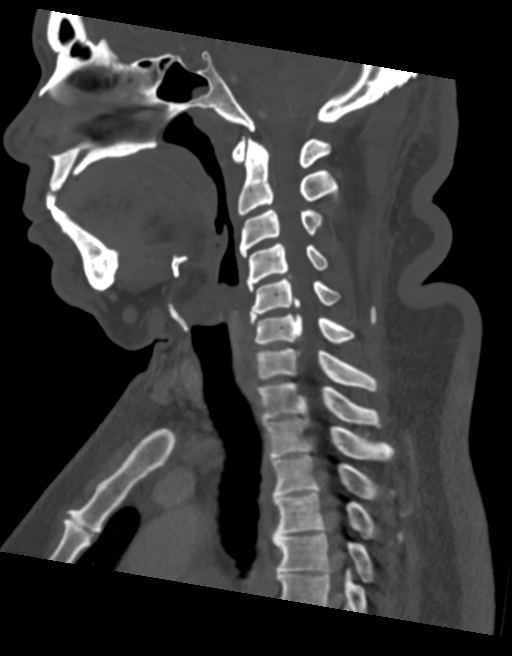
[im 80/138  bone]
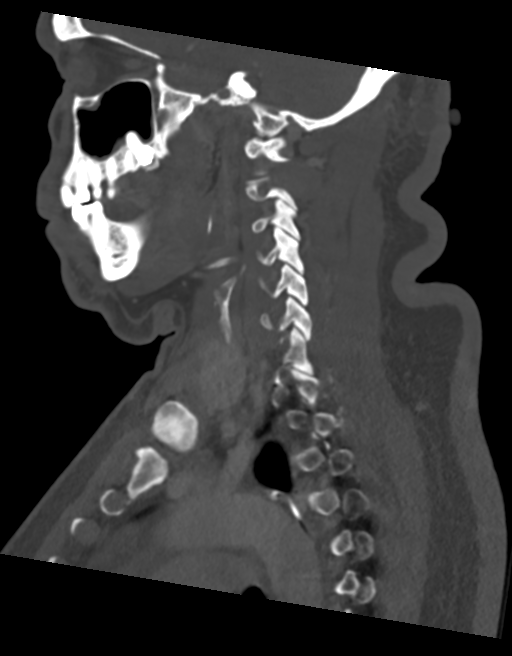
[im 92/138  bone]
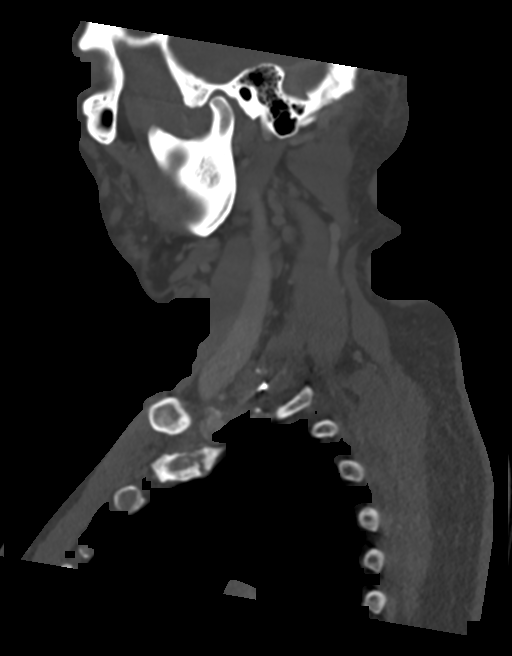

[Series 9: ax oropharynx neck neck (person_name) · axial · 0.54mm/px · z∈[-821,-658]mm · 3 of 167 slices shown, 4 images]
[im 42/167  soft-tissue]
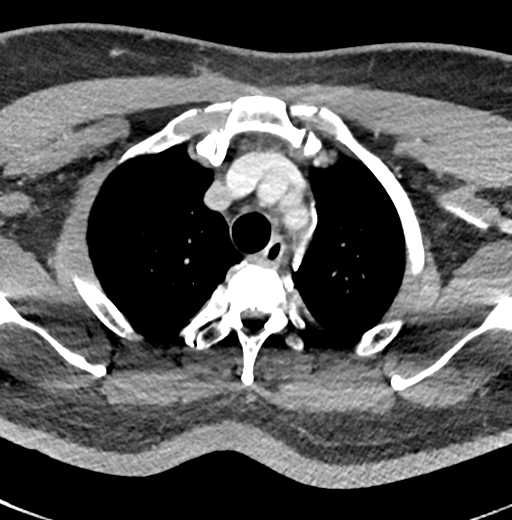
[im 42/167  bone]
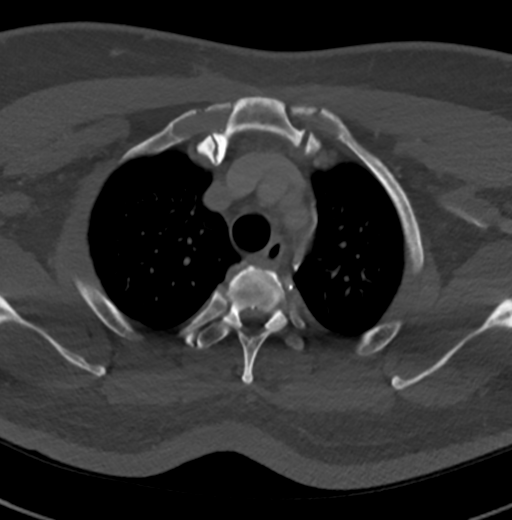
[im 84/167  bone]
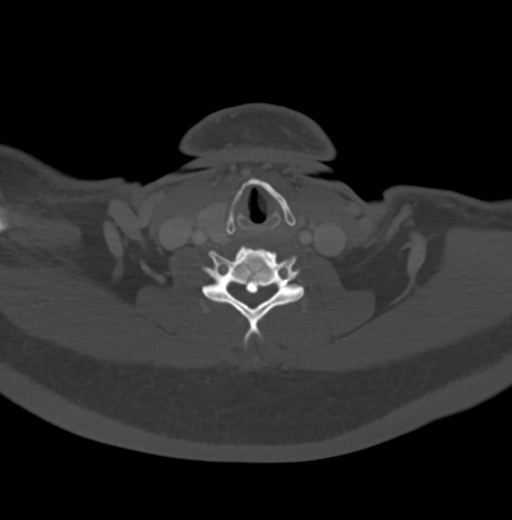
[im 125/167  bone]
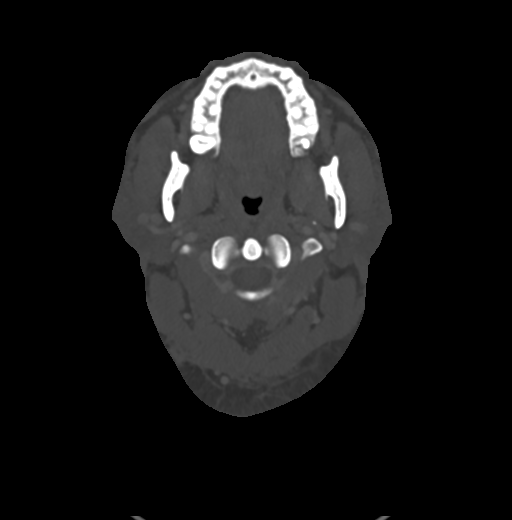

[13 of 35 positions shown; findings below may reference images not displayed]

FINDINGS: Pharynx and larynx: Normal. No mass or swelling.

Salivary glands: No inflammation, mass, or stone.

Thyroid: Normal.

Lymph nodes: None enlarged or abnormal density.

Vascular: Negative.

Limited intracranial: Negative.

Visualized orbits: Negative.

Mastoids and visualized paranasal sinuses: Clear.

Skeleton: Spondylosis.

Upper chest: No mass or pneumothorax.

Other: In the posterior paramedian soft tissues of the neck, there
is a subcutaneous low-attenuation fluid collection, estimated 18 x
13 x 14 mm cross-section, with a tract extending to the scan,
consistent with a abscess or infected sebaceous cyst. There is
extensive surrounding cellulitic change. Chronic appearing thickened
dermis.
IMPRESSION: 18 x 13 x 14 mm subcutaneous abscess or infected sebaceous cyst in
the LEFT posterior paramedian neck.

## 2021-12-29 ENCOUNTER — Encounter (HOSPITAL_COMMUNITY): Payer: Self-pay | Admitting: *Deleted

## 2022-03-05 ENCOUNTER — Encounter: Payer: Self-pay | Admitting: Emergency Medicine

## 2022-03-05 ENCOUNTER — Other Ambulatory Visit: Payer: Self-pay

## 2022-03-05 ENCOUNTER — Inpatient Hospital Stay
Admission: EM | Admit: 2022-03-05 | Discharge: 2022-03-08 | DRG: 291 | Disposition: A | Discharge status: Home / Self Care | Payer: 59 | Attending: Internal Medicine | Admitting: Internal Medicine

## 2022-03-05 ENCOUNTER — Emergency Department: Payer: 59

## 2022-03-05 DIAGNOSIS — M170 Bilateral primary osteoarthritis of knee: Secondary | ICD-10-CM | POA: Diagnosis present

## 2022-03-05 DIAGNOSIS — J9811 Atelectasis: Secondary | ICD-10-CM | POA: Diagnosis present

## 2022-03-05 DIAGNOSIS — I160 Hypertensive urgency: Secondary | ICD-10-CM | POA: Diagnosis not present

## 2022-03-05 DIAGNOSIS — I161 Hypertensive emergency: Secondary | ICD-10-CM | POA: Diagnosis present

## 2022-03-05 DIAGNOSIS — R0600 Dyspnea, unspecified: Secondary | ICD-10-CM

## 2022-03-05 DIAGNOSIS — R509 Fever, unspecified: Secondary | ICD-10-CM

## 2022-03-05 DIAGNOSIS — R778 Other specified abnormalities of plasma proteins: Secondary | ICD-10-CM | POA: Diagnosis present

## 2022-03-05 DIAGNOSIS — E1122 Type 2 diabetes mellitus with diabetic chronic kidney disease: Secondary | ICD-10-CM | POA: Diagnosis present

## 2022-03-05 DIAGNOSIS — R319 Hematuria, unspecified: Secondary | ICD-10-CM | POA: Diagnosis present

## 2022-03-05 DIAGNOSIS — Z833 Family history of diabetes mellitus: Secondary | ICD-10-CM

## 2022-03-05 DIAGNOSIS — G4733 Obstructive sleep apnea (adult) (pediatric): Secondary | ICD-10-CM | POA: Diagnosis present

## 2022-03-05 DIAGNOSIS — R059 Cough, unspecified: Secondary | ICD-10-CM | POA: Diagnosis not present

## 2022-03-05 DIAGNOSIS — N1831 Chronic kidney disease, stage 3a: Secondary | ICD-10-CM | POA: Diagnosis present

## 2022-03-05 DIAGNOSIS — E1129 Type 2 diabetes mellitus with other diabetic kidney complication: Secondary | ICD-10-CM | POA: Diagnosis present

## 2022-03-05 DIAGNOSIS — E876 Hypokalemia: Secondary | ICD-10-CM | POA: Diagnosis present

## 2022-03-05 DIAGNOSIS — E66813 Obesity, class 3: Secondary | ICD-10-CM | POA: Diagnosis present

## 2022-03-05 DIAGNOSIS — N179 Acute kidney failure, unspecified: Secondary | ICD-10-CM | POA: Diagnosis present

## 2022-03-05 DIAGNOSIS — J40 Bronchitis, not specified as acute or chronic: Secondary | ICD-10-CM | POA: Diagnosis present

## 2022-03-05 DIAGNOSIS — Z801 Family history of malignant neoplasm of trachea, bronchus and lung: Secondary | ICD-10-CM

## 2022-03-05 DIAGNOSIS — E875 Hyperkalemia: Secondary | ICD-10-CM | POA: Diagnosis present

## 2022-03-05 DIAGNOSIS — Z91148 Patient's other noncompliance with medication regimen for other reason: Secondary | ICD-10-CM

## 2022-03-05 DIAGNOSIS — I5A Non-ischemic myocardial injury (non-traumatic): Secondary | ICD-10-CM | POA: Diagnosis present

## 2022-03-05 DIAGNOSIS — Z8249 Family history of ischemic heart disease and other diseases of the circulatory system: Secondary | ICD-10-CM

## 2022-03-05 DIAGNOSIS — R7303 Prediabetes: Secondary | ICD-10-CM | POA: Diagnosis present

## 2022-03-05 DIAGNOSIS — I130 Hypertensive heart and chronic kidney disease with heart failure and stage 1 through stage 4 chronic kidney disease, or unspecified chronic kidney disease: Secondary | ICD-10-CM | POA: Diagnosis not present

## 2022-03-05 DIAGNOSIS — D631 Anemia in chronic kidney disease: Secondary | ICD-10-CM | POA: Diagnosis present

## 2022-03-05 DIAGNOSIS — Z6841 Body Mass Index (BMI) 40.0 and over, adult: Secondary | ICD-10-CM

## 2022-03-05 DIAGNOSIS — I5031 Acute diastolic (congestive) heart failure: Secondary | ICD-10-CM | POA: Diagnosis present

## 2022-03-05 DIAGNOSIS — Z20822 Contact with and (suspected) exposure to covid-19: Secondary | ICD-10-CM | POA: Diagnosis present

## 2022-03-05 DIAGNOSIS — Z9989 Dependence on other enabling machines and devices: Secondary | ICD-10-CM

## 2022-03-05 LAB — URINE DRUG SCREEN, QUALITATIVE (ARMC ONLY)
Amphetamines, Ur Screen: NOT DETECTED
Barbiturates, Ur Screen: NOT DETECTED
Benzodiazepine, Ur Scrn: NOT DETECTED
Cannabinoid 50 Ng, Ur ~~LOC~~: NOT DETECTED
Cocaine Metabolite,Ur ~~LOC~~: NOT DETECTED
MDMA (Ecstasy)Ur Screen: NOT DETECTED
Methadone Scn, Ur: NOT DETECTED
Opiate, Ur Screen: NOT DETECTED
Phencyclidine (PCP) Ur S: NOT DETECTED
Tricyclic, Ur Screen: NOT DETECTED

## 2022-03-05 LAB — CBC WITH DIFFERENTIAL/PLATELET
Abs Immature Granulocytes: 0.05 10*3/uL (ref 0.00–0.07)
Basophils Absolute: 0.1 10*3/uL (ref 0.0–0.1)
Basophils Relative: 1 %
Eosinophils Absolute: 0 10*3/uL (ref 0.0–0.5)
Eosinophils Relative: 1 %
HCT: 38.6 % — ABNORMAL LOW (ref 39.0–52.0)
Hemoglobin: 12.6 g/dL — ABNORMAL LOW (ref 13.0–17.0)
Immature Granulocytes: 1 %
Lymphocytes Relative: 25 %
Lymphs Abs: 1.5 10*3/uL (ref 0.7–4.0)
MCH: 28.1 pg (ref 26.0–34.0)
MCHC: 32.6 g/dL (ref 30.0–36.0)
MCV: 86 fL (ref 80.0–100.0)
Monocytes Absolute: 0.4 10*3/uL (ref 0.1–1.0)
Monocytes Relative: 6 %
Neutro Abs: 4.1 10*3/uL (ref 1.7–7.7)
Neutrophils Relative %: 66 %
Platelets: 183 10*3/uL (ref 150–400)
RBC: 4.49 MIL/uL (ref 4.22–5.81)
RDW: 13.6 % (ref 11.5–15.5)
WBC: 6.1 10*3/uL (ref 4.0–10.5)
nRBC: 0 % (ref 0.0–0.2)

## 2022-03-05 LAB — PROTIME-INR
INR: 1.2 (ref 0.8–1.2)
Prothrombin Time: 15.1 seconds (ref 11.4–15.2)

## 2022-03-05 LAB — COMPREHENSIVE METABOLIC PANEL
ALT: 24 U/L (ref 0–44)
AST: 31 U/L (ref 15–41)
Albumin: 3.5 g/dL (ref 3.5–5.0)
Alkaline Phosphatase: 73 U/L (ref 38–126)
Anion gap: 11 (ref 5–15)
BUN: 38 mg/dL — ABNORMAL HIGH (ref 6–20)
CO2: 22 mmol/L (ref 22–32)
Calcium: 8.8 mg/dL — ABNORMAL LOW (ref 8.9–10.3)
Chloride: 107 mmol/L (ref 98–111)
Creatinine, Ser: 3.88 mg/dL — ABNORMAL HIGH (ref 0.61–1.24)
GFR, Estimated: 17 mL/min — ABNORMAL LOW (ref >60)
Glucose, Bld: 100 mg/dL — ABNORMAL HIGH (ref 70–99)
Potassium: 3.3 mmol/L — ABNORMAL LOW (ref 3.5–5.1)
Sodium: 140 mmol/L (ref 135–145)
Total Bilirubin: 0.8 mg/dL (ref 0.3–1.2)
Total Protein: 7.8 g/dL (ref 6.5–8.1)

## 2022-03-05 LAB — LIPID PANEL
Cholesterol: 162 mg/dL (ref 0–200)
HDL: 39 mg/dL — ABNORMAL LOW (ref >40)
LDL Cholesterol: 101 mg/dL — ABNORMAL HIGH (ref 0–99)
Total CHOL/HDL Ratio: 4.2 RATIO
Triglycerides: 108 mg/dL (ref <150)
VLDL: 22 mg/dL (ref 0–40)

## 2022-03-05 LAB — RESP PANEL BY RT-PCR (FLU A&B, COVID) ARPGX2
Influenza A by PCR: NEGATIVE
Influenza B by PCR: NEGATIVE
SARS Coronavirus 2 by RT PCR: NEGATIVE

## 2022-03-05 LAB — URINALYSIS, ROUTINE W REFLEX MICROSCOPIC
Bilirubin Urine: NEGATIVE
Glucose, UA: NEGATIVE mg/dL
Ketones, ur: 5 mg/dL — AB
Leukocytes,Ua: NEGATIVE
Nitrite: NEGATIVE
Protein, ur: >=300 mg/dL — AB
Specific Gravity, Urine: 1.008 (ref 1.005–1.030)
Squamous Epithelial / HPF: NONE SEEN (ref 0–5)
pH: 6 (ref 5.0–8.0)

## 2022-03-05 LAB — TROPONIN I (HIGH SENSITIVITY)
Troponin I (High Sensitivity): 73 ng/L — ABNORMAL HIGH (ref <18)
Troponin I (High Sensitivity): 79 ng/L — ABNORMAL HIGH (ref <18)
Troponin I (High Sensitivity): 81 ng/L — ABNORMAL HIGH (ref <18)
Troponin I (High Sensitivity): 75 ng/L — ABNORMAL HIGH (ref <18)

## 2022-03-05 LAB — LACTIC ACID, PLASMA: Lactic Acid, Venous: 0.7 mmol/L (ref 0.5–1.9)

## 2022-03-05 LAB — MAGNESIUM: Magnesium: 2.3 mg/dL (ref 1.7–2.4)

## 2022-03-05 LAB — PROCALCITONIN: Procalcitonin: 0.91 ng/mL

## 2022-03-05 MED ORDER — ASPIRIN 81 MG PO TBEC
81.0000 mg | DELAYED_RELEASE_TABLET | Freq: Every day | ORAL | Status: DC
  Administered 2022-03-05 – 2022-03-08 (×4): 81 mg via ORAL
  Filled 2022-03-05 (×4): qty 1

## 2022-03-05 MED ORDER — DM-GUAIFENESIN ER 30-600 MG PO TB12: 1.0000 | ORAL_TABLET | Freq: Two times a day (BID) | ORAL | Status: DC | PRN

## 2022-03-05 MED ORDER — HYDRALAZINE HCL 20 MG/ML IJ SOLN
5.0000 mg | INTRAMUSCULAR | Status: DC | PRN
Start: 2022-03-05 — End: 2022-03-08
  Administered 2022-03-06 (×2): 5 mg via INTRAVENOUS
  Filled 2022-03-05 (×3): qty 1

## 2022-03-05 MED ORDER — ONDANSETRON HCL 4 MG/2ML IJ SOLN: 4.0000 mg | Freq: Three times a day (TID) | INTRAMUSCULAR | Status: DC | PRN

## 2022-03-05 MED ORDER — ALBUTEROL SULFATE (2.5 MG/3ML) 0.083% IN NEBU: 2.5000 mg | INHALATION_SOLUTION | RESPIRATORY_TRACT | Status: DC | PRN

## 2022-03-05 MED ORDER — POTASSIUM CHLORIDE CRYS ER 20 MEQ PO TBCR
40.0000 meq | EXTENDED_RELEASE_TABLET | Freq: Once | ORAL | Status: AC
  Administered 2022-03-05: 40 meq via ORAL
  Filled 2022-03-05: qty 2

## 2022-03-05 MED ORDER — NITROGLYCERIN 0.4 MG SL SUBL: 0.4000 mg | SUBLINGUAL_TABLET | SUBLINGUAL | Status: DC | PRN

## 2022-03-05 MED ORDER — SODIUM CHLORIDE 0.9 % IV SOLN: INTRAVENOUS | Status: DC

## 2022-03-05 MED ORDER — LABETALOL HCL 5 MG/ML IV SOLN
10.0000 mg | Freq: Once | INTRAVENOUS | Status: AC
  Administered 2022-03-05: 10 mg via INTRAVENOUS
  Filled 2022-03-05: qty 4

## 2022-03-05 MED ORDER — HEPARIN SODIUM (PORCINE) 5000 UNIT/ML IJ SOLN
5000.0000 [IU] | Freq: Three times a day (TID) | INTRAMUSCULAR | Status: DC
  Administered 2022-03-06 – 2022-03-08 (×5): 5000 [IU] via SUBCUTANEOUS
  Filled 2022-03-05 (×6): qty 1

## 2022-03-05 MED ORDER — ACETAMINOPHEN 500 MG PO TABS
1000.0000 mg | ORAL_TABLET | Freq: Once | ORAL | Status: AC
  Administered 2022-03-05: 1000 mg via ORAL
  Filled 2022-03-05: qty 2

## 2022-03-05 MED ORDER — AMLODIPINE BESYLATE 10 MG PO TABS
10.0000 mg | ORAL_TABLET | Freq: Every day | ORAL | Status: DC
  Administered 2022-03-05 – 2022-03-06 (×2): 10 mg via ORAL
  Filled 2022-03-05: qty 2
  Filled 2022-03-05: qty 1

## 2022-03-05 MED ORDER — CALCIUM CARBONATE ANTACID 500 MG PO CHEW
200.0000 mg | CHEWABLE_TABLET | Freq: Two times a day (BID) | ORAL | Status: DC
Start: 2022-03-05 — End: 2022-03-08
  Administered 2022-03-05 – 2022-03-08 (×6): 200 mg via ORAL
  Filled 2022-03-05 (×6): qty 1

## 2022-03-05 MED ORDER — ACETAMINOPHEN 325 MG PO TABS
650.0000 mg | ORAL_TABLET | Freq: Four times a day (QID) | ORAL | Status: DC | PRN
  Administered 2022-03-05 – 2022-03-06 (×2): 650 mg via ORAL
  Filled 2022-03-05 (×2): qty 2

## 2022-03-05 MED ORDER — ADULT MULTIVITAMIN W/MINERALS CH
1.0000 | ORAL_TABLET | Freq: Two times a day (BID) | ORAL | Status: DC
  Administered 2022-03-05 – 2022-03-08 (×6): 1 via ORAL
  Filled 2022-03-05 (×6): qty 1

## 2022-03-05 MED ORDER — AZITHROMYCIN 500 MG PO TABS
500.0000 mg | ORAL_TABLET | Freq: Every day | ORAL | Status: AC
  Administered 2022-03-05: 500 mg via ORAL
  Filled 2022-03-05: qty 1

## 2022-03-05 MED ORDER — AZITHROMYCIN 250 MG PO TABS
250.0000 mg | ORAL_TABLET | Freq: Every day | ORAL | Status: AC
  Administered 2022-03-06 – 2022-03-08 (×3): 250 mg via ORAL
  Filled 2022-03-05 (×3): qty 1

## 2022-03-05 NOTE — Assessment & Plan Note (Addendum)
Renal ultrasound is negative for obstruction. Held Cozaar and Voltaren gel.  Secondary in part due to uncontrolled hypertension and some heart failure.  Patient has never had a nephrologist before.  Last creatinine on record at 1.452 years ago.  On arrival at 3.88.  Will monitor while on Lasix.  Down 3.66 today.  Nephrology following and will also see him as outpatient.

## 2022-03-05 NOTE — Assessment & Plan Note (Addendum)
Elevated troponins secondary to CHF.  No evidence of ACS.

## 2022-03-05 NOTE — ED Provider Notes (Signed)
[facility] Provider Note    Event Date/Time   First MD Initiated Contact with Patient 03/05/22 1050     (approximate)   History   Fever, Shortness of Breath, Hypertension, and Weakness   HPI  Marwin Primmer is a 56 y.o. male who comes in complaining of some shortness of breath and it cough which is dry.  He is running intermittent fever off and on for several days.  His cough is nonproductive.  He is not febrile here.  Blood pressure here was 259/122.  We got an IV and gave him 10 of labetalol which brought his blood pressure down nicely.    [facility]  he is now 145/99.      Physical Exam   Triage Vital Signs: ED Triage Vitals  Enc Vitals Group     BP 03/05/22 1047 (!) 259/122     Pulse Rate 03/05/22 1047 (!) 102     Resp 03/05/22 1047 20     Temp 03/05/22 1047 98.9 F (37.2 C)     Temp src --      SpO2 03/05/22 1047 98 %     Weight 03/05/22 1046 265 lb (120.2 kg)     Height 03/05/22 1046 '5\' 6"'  (1.676 m)     Head Circumference --      Peak Flow --      Pain Score 03/05/22 1046 8     Pain Loc --      Pain Edu? --      Excl. in [place]? --     Most recent vital signs: Vitals:   03/05/22 1330 03/05/22 1400  BP: (!) 119/51 132/60  Pulse: 97 99  Resp: (!) 0 (!) 22  Temp:    SpO2: 95% 95%     General: Awake, no distress.  Looks well CV:  Good peripheral perfusion.  Heart regular rate and rhythm no audible murmurs Resp:  Normal effort.  Lungs no wheezing no audible crackles Abd:  No distention.  Soft and nontender no organic Extremities: Slight trace edema very minimal   ED Results / Procedures / Treatments   Labs (all labs ordered are listed, but only abnormal results are displayed) Labs Reviewed  COMPREHENSIVE METABOLIC PANEL - Abnormal; Notable for the following components:      Result Value   Potassium 3.3 (*)    Glucose, Bld 100 (*)    BUN 38 (*)    Creatinine, Ser 3.88 (*)    Calcium 8.8 (*)    GFR, Estimated 17  (*)    All other components within normal limits  CBC WITH DIFFERENTIAL/PLATELET - Abnormal; Notable for the following components:   Hemoglobin 12.6 (*)    HCT 38.6 (*)    All other components within normal limits  URINALYSIS, ROUTINE W REFLEX MICROSCOPIC - Abnormal; Notable for the following components:   Color, Urine YELLOW (*)    APPearance CLEAR (*)    Hgb urine dipstick MODERATE (*)    Ketones, ur 5 (*)    Protein, ur >=300 (*)    Bacteria, UA RARE (*)    All other components within normal limits  BLOOD GAS, VENOUS - Abnormal; Notable for the following components:   pCO2, Ven 41 (*)    All other components within normal limits  TROPONIN I (HIGH SENSITIVITY) - Abnormal; Notable for the following components:   Troponin I (High Sensitivity) 73 (*)    All other components within normal limits  TROPONIN I (HIGH SENSITIVITY) -  Abnormal; Notable for the following components:   Troponin I (High Sensitivity) 79 (*)    All other components within normal limits  RESP PANEL BY RT-PCR (FLU A&B, COVID) ARPGX2  CULTURE, BLOOD (ROUTINE X 2)  CULTURE, BLOOD (ROUTINE X 2)  LACTIC ACID, PLASMA  PROCALCITONIN  D-DIMER, QUANTITATIVE    SARS was negative yesterday  EKG  EKG read interpreted by me shows sinus tachycardia rate of 102 normal axis no acute ST-T wave changes   RADIOLOGY  Chest x-ray read by me and reading later confirmed by radiology shows some haziness in the right base.  Radiologist reads this is atelectasis.  No other marked changes  PROCEDURES:  Critical Care performed:   Procedures   MEDICATIONS ORDERED IN ED: Medications  labetalol (NORMODYNE) injection 10 mg (10 mg Intravenous Given 03/05/22 1113)  acetaminophen (TYLENOL) tablet 1,000 mg (1,000 mg Oral Given 03/05/22 1405)     IMPRESSION / MDM / ASSESSMENT AND PLAN / ED COURSE  I reviewed the triage vital signs and the nursing notes. Lab work take some time to come back when it does AKI is present with EGFR of  17 this is new his last GFR 2 years ago was 44.  Its possible that this is due to his hypertension.  Patient also has some microscopic blood in his urine.     The patient is on the cardiac monitor to evaluate for evidence of arrhythmia and/or significant heart rate cha were seen     FINAL CLINICAL IMPRESSION(S) / ED DIAGNOSES   Final diagnoses:  AKI (acute kidney injury) ([place])  Dyspnea, unspecified type  Fever, unspecified fever cause     Rx / DC Orders   ED Discharge Orders     None        Note:  This document was prepared using Dragon voice recognition software and may include unintentional dictation errors.   Nena Polio, MD 03/05/22 1416

## 2022-03-05 NOTE — Assessment & Plan Note (Addendum)
Replacing as needed.  Magnesium level normal.

## 2022-03-05 NOTE — Assessment & Plan Note (Addendum)
Patient has been without his blood pressure medicines x2 weeks.  His PCP had retired and he had not yet established with a new one.  This was likely the triggering point for his CHF which only worsened his blood pressure.  I have started p.o. Imdur, p.o. hydralazine and IV Lasix.

## 2022-03-05 NOTE — Assessment & Plan Note (Addendum)
Diet-controlled diabetes.  A1c pending.

## 2022-03-05 NOTE — Assessment & Plan Note (Addendum)
Etiology is not clear.  Noted subjective fever x1 week.  Cough only started 2 days ago which may be more likely due to the heart failure.  Procalcitonin on admission at 0.91, so have started Zithromax.

## 2022-03-05 NOTE — Assessment & Plan Note (Addendum)
Meets criteria for BMI greater than 40. 

## 2022-03-05 NOTE — ED Triage Notes (Addendum)
Pt reports started with fever and chills and started to feel worse and SOB so this am he went to UC and was advised to come to the ED for evaluation. Pt reports feels really weak and SOB. Pt states HA as well and reports BP has been exteremly high

## 2022-03-05 NOTE — H&P (Signed)
History and Physical    Devon Rios ACZ:[id number] DOB: 1966-05-15 DOA: 03/05/2022  Referring MD/NP/PA:   PCP: Jearld Fenton, NP   Patient coming from:  The patient is coming from home.   Chief Complaint: Cough, shortness of breath, weakness, intermittent fever  HPI: Devon Rios is a 56 y.o. male with medical history significant of hypertension, diabetes mellitus, morbid obesity with BMI 42.77, OSA on CPAP, CKD-3, who presents with cough, shortness breath, weakness, intermittent fever.  Patient states that he has dry cough, mild shortness of breath, intermittent mild subjective fever, chills, generalized weakness for about 1 week.  Denies chest pain.  No nausea, vomiting, diarrhea or abdominal pain.  No symptoms of UTI.  He states that he ran out of his blood pressure medications for more than a week, and his blood pressure is elevated.  Patient  was seen in urgent care initially, and was found to have significantly elevated blood pressure and fever of 101.7. Patient was sent to ED for further evaluation and treatment.    Data reviewed independently and ED Course: pt was found to have WBC 6.1, urinalysis (negative for UTI, but showed RBC 21-50, protein> 300), worsening renal function with creatinine 3.88, BUN 38, GFR 17 (baseline creatinine 1.45 on 03/04/2020), troponin level 73, 79, lactic acid 0.7, temperature normal, blood pressure 259/122, heart rate 102, RR 28, oxygen saturation 99% on room air.  Chest x-ray showed cardiomegaly and mild right basilar atelectasis.  Renal ultrasound is negative for obstruction. Patient is placed on telemetry bed for observation.  Message sent to Dr. Holley Raring of renal for consultation.   EKG: I have personally reviewed.  Sinus rhythm, QTc 461, LAE, nonspecific T wave change  Review of Systems:   General: Has subjective fevers, chills, no body weight gain, has fatigue HEENT: no blurry vision, hearing changes or sore throat Respiratory: has dyspnea, coughing,  no wheezing CV: no chest pain, no palpitations GI: no nausea, vomiting, abdominal pain, diarrhea, constipation GU: no dysuria, burning on urination, increased urinary frequency, hematuria  Ext: no leg edema Neuro: no unilateral weakness, numbness, or tingling, no vision change or hearing loss Skin: no rash, no skin tear. MSK: No muscle spasm, no deformity, no limitation of range of movement in spin Heme: No easy bruising.  Travel history: No recent long distant travel.   Allergy: No Known Allergies  Past Medical History:  Diagnosis Date   Arthritis    oa left knee worse than right   DM2 (diabetes mellitus, type 2) ([place]) 07/21/2014   H/O HAD GASTRIC SLEEVE IN 2018-LOST 150 LBS AND NOW IS UNDER CONTROL WITH DIET AND EXERCISE   Frequent headaches    weekly   Hypertension    H/O BEEN OF BP MEDS 06-2018 DUE TO CONTROL -PT HAD GASTRIC SLEEVE SURGERY AND LOST 150 LBS AND NOW ITS CONTROLLED WITH DIET AND EXERCISE   OSA on CPAP 2013   Severe obesity (BMI >= 40) ([place]) 07/14/2014    Past Surgical History:  Procedure Laterality Date   COLONOSCOPY WITH PROPOFOL N/A 05/13/2020   Procedure: COLONOSCOPY WITH PROPOFOL;  Surgeon: Jonathon Bellows, MD;  Location: [facility] ENDOSCOPY;  Service: Gastroenterology;  Laterality: N/A;   coloscopy  age 32   INCISION AND DRAINAGE ABSCESS N/A 09/24/2018   Procedure: INCISION AND DRAINAGE OF CYST, POSTERIOR SCALP;  Surgeon: Benjamine Sprague, DO;  Location: [facility] ORS;  Service: General;  Laterality: N/A;   LAPAROSCOPIC GASTRIC SLEEVE RESECTION N/A 06/11/2017   Procedure: LAPAROSCOPIC GASTRIC SLEEVE RESECTION WITH  UPPER ENDO;  Surgeon: Johnathan Hausen, MD;  Location: [facility] ORS;  Service: General;  Laterality: N/A;   left knee fluid drained and cortisone injection  03/2017   MENISCUS REPAIR Right    Right knee   VASECTOMY      Social History:  reports that he has never smoked. He has never used smokeless tobacco. He reports that he does not currently use alcohol. He reports  that he does not use drugs.  Family History:  Family History  Problem Relation Age of Onset   Hypertension Mother    Diabetes Mother    Cancer Maternal Grandmother        Lung   Stroke Neg Hx      Prior to Admission medications   Medication Sig Start Date End Date Taking? Authorizing Provider  Acetaminophen (TYLENOL ARTHRITIS PAIN PO) Take by mouth.    [provider]  Blood Glucose Monitoring Suppl (BAYER CONTOUR MONITOR) w/Device KIT Check blood sugar twice a day and as directed Dx E11.9 06/13/17   Jearld Fenton, NP  Calcium 500 MG CHEW Chew 1 each by mouth 2 (two) times daily.     [provider]  diclofenac Sodium (VOLTAREN) 1 % GEL Apply topically 4 (four) times daily.    [provider]  losartan (COZAAR) 50 MG tablet Take 1 tablet (50 mg total) by mouth daily. 11/08/20   Jearld Fenton, NP  Misc Natural Products (COLON CLEANSE PO) Take by mouth. As Needed    [provider]  Multiple Vitamins-Minerals (CELEBRATE MULTI-COMPLETE 45) CAPS Take 1 each by mouth 2 (two) times daily.     [provider]  [facility] DELICA LANCETS 91Y MISC 1 each by Does not apply route 2 (two) times daily. Dx E11.9 11/05/14   Jearld Fenton, NP    Physical Exam: Vitals:   03/05/22 1500 03/05/22 1600 03/05/22 1630 03/05/22 1825  BP: (!) 114/53 (!) 130/55 134/61 (!) 157/70  Pulse: 94 88 88 81  Resp: (!) 22   20  Temp:      SpO2: 96% 97% 96% 95%  Weight:      Height:       General: Not in acute distress HEENT:       Eyes: PERRL, EOMI, no scleral icterus.       ENT: No discharge from the ears and nose, no pharynx injection, no tonsillar enlargement.        Neck: No JVD, no bruit, no mass felt. Heme: No neck lymph node enlargement. Cardiac: S1/S2, RRR, No murmurs, No gallops or rubs. Respiratory: No rales, wheezing, rhonchi or rubs. GI: Soft, nondistended, nontender, no rebound pain, no organomegaly, BS present. GU: No hematuria Ext: No pitting leg  edema bilaterally. 1+DP/PT pulse bilaterally. Musculoskeletal: No joint deformities, No joint redness or warmth, no limitation of ROM in spin. Skin: No rashes.  Neuro: Alert, oriented X3, cranial nerves II-XII grossly intact, moves all extremities normally. Psych: Patient is not psychotic, no suicidal or hemocidal ideation.  Labs on Admission: I have personally reviewed following labs and imaging studies  CBC: Recent Labs  Lab 03/05/22 1102  WBC 6.1  NEUTROABS 4.1  HGB 12.6*  HCT 38.6*  MCV 86.0  PLT 782   Basic Metabolic Panel: Recent Labs  Lab 03/05/22 1102  NA 140  K 3.3*  CL 107  CO2 22  GLUCOSE 100*  BUN 38*  CREATININE 3.88*  CALCIUM 8.8*   GFR: Estimated Creatinine Clearance: 26.3 mL/min (A) (by  C-G formula based on SCr of 3.88 mg/dL (H)). Liver Function Tests: Recent Labs  Lab 03/05/22 1102  AST 31  ALT 24  ALKPHOS 73  BILITOT 0.8  PROT 7.8  ALBUMIN 3.5   No results for input(s): "LIPASE", "AMYLASE" in the last 168 hours. No results for input(s): "AMMONIA" in the last 168 hours. Coagulation Profile: No results for input(s): "INR", "PROTIME" in the last 168 hours. Cardiac Enzymes: No results for input(s): "CKTOTAL", "CKMB", "CKMBINDEX", "TROPONINI" in the last 168 hours. BNP (last 3 results) No results for input(s): "PROBNP" in the last 8760 hours. HbA1C: No results for input(s): "HGBA1C" in the last 72 hours. CBG: No results for input(s): "GLUCAP" in the last 168 hours. Lipid Profile: No results for input(s): "CHOL", "HDL", "LDLCALC", "TRIG", "CHOLHDL", "LDLDIRECT" in the last 72 hours. Thyroid Function Tests: No results for input(s): "TSH", "T4TOTAL", "FREET4", "T3FREE", "THYROIDAB" in the last 72 hours. Anemia Panel: No results for input(s): "VITAMINB12", "FOLATE", "FERRITIN", "TIBC", "IRON", "RETICCTPCT" in the last 72 hours. Urine analysis:    Component Value Date/Time   COLORURINE YELLOW (A) 03/05/2022 1102   APPEARANCEUR CLEAR (A)  03/05/2022 1102   LABSPEC 1.008 03/05/2022 1102   PHURINE 6.0 03/05/2022 1102   GLUCOSEU NEGATIVE 03/05/2022 1102   HGBUR MODERATE (A) 03/05/2022 1102   BILIRUBINUR NEGATIVE 03/05/2022 1102   KETONESUR 5 (A) 03/05/2022 1102   PROTEINUR >=300 (A) 03/05/2022 1102   NITRITE NEGATIVE 03/05/2022 1102   LEUKOCYTESUR NEGATIVE 03/05/2022 1102   Sepsis Labs: '@LABRCNTIP' (procalcitonin:4,lacticidven:4) ) Recent Results (from the past 240 hour(s))  Resp Panel by RT-PCR (Flu A&B, Covid) Anterior Nasal Swab     Status: None   Collection Time: 03/05/22 11:02 AM   Specimen: Anterior Nasal Swab  Result Value Ref Range Status   SARS Coronavirus 2 by RT PCR NEGATIVE NEGATIVE Final    Comment: (NOTE) SARS-CoV-2 target nucleic acids are NOT DETECTED.  The SARS-CoV-2 RNA is generally detectable in upper respiratory specimens during the acute phase of infection. The lowest concentration of SARS-CoV-2 viral copies this assay can detect is 138 copies/mL. A negative result does not preclude SARS-Cov-2 infection and should not be used as the sole basis for treatment or other patient management decisions. A negative result may occur with  improper specimen collection/handling, submission of specimen other than nasopharyngeal swab, presence of viral mutation(s) within the areas targeted by this assay, and inadequate number of viral copies(<138 copies/mL). A negative result must be combined with clinical observations, patient history, and epidemiological information. The expected result is Negative.  Fact Sheet for Patients:  EntrepreneurPulse.com.au  Fact Sheet for Healthcare Providers:  IncredibleEmployment.be  This test is no t yet approved or cleared by the [place] FDA and  has been authorized for detection and/or diagnosis of SARS-CoV-2 by FDA under an Emergency Use Authorization (EUA). This EUA will remain  in effect (meaning this test can be used) for the  duration of the COVID-19 declaration under Section 564(b)(1) of the Act, 21 U.S.C.section 360bbb-3(b)(1), unless the authorization is terminated  or revoked sooner.       Influenza A by PCR NEGATIVE NEGATIVE Final   Influenza B by PCR NEGATIVE NEGATIVE Final    Comment: (NOTE) The Xpert Xpress SARS-CoV-2/FLU/RSV plus assay is intended as an aid in the diagnosis of influenza from Nasopharyngeal swab specimens and should not be used as a sole basis for treatment. Nasal washings and aspirates are unacceptable for Xpert Xpress SARS-CoV-2/FLU/RSV testing.  Fact Sheet for Patients: EntrepreneurPulse.com.au  Fact  Sheet for Healthcare Providers: IncredibleEmployment.be  This test is not yet approved or cleared by the [place] and has been authorized for detection and/or diagnosis of SARS-CoV-2 by FDA under an Emergency Use Authorization (EUA). This EUA will remain in effect (meaning this test can be used) for the duration of the COVID-19 declaration under Section 564(b)(1) of the Act, 21 U.S.C. section 360bbb-3(b)(1), unless the authorization is terminated or revoked.  Performed at [facility], [address] [place], [place] [address]      Radiological Exams on Admission: US Renal  Result Date: 03/05/2022 CLINICAL DATA:  New onset AKI with fever EXAM: RENAL / URINARY TRACT ULTRASOUND COMPLETE COMPARISON:  None Available. FINDINGS: Right Kidney: Renal measurements: 8.7 x 5.2 x 4.3 cm = volume: 103 mL. Echogenicity increased. A 1 x 1.2 x 1.1 cm cystic lesion likely representing a simple renal cyst. Simple renal cysts, in the absence of clinically indicated signs/symptoms, require no independent follow-up. No solid mass or hydronephrosis visualized. Left Kidney: Renal measurements: 9.1 x 4.9 x 4.7 cm = volume: 109 mL. Echogenicity increased. No mass or hydronephrosis visualized. Urinary bladder: Not visualized-empty. Other: None.  IMPRESSION: Increased renal parenchymal echogenicity suggestive of renal parenchymal disease. Electronically Signed   By: Iven Finn M.D.   On: 03/05/2022 15:20   DG Chest Portable 1 View  Result Date: 03/05/2022 CLINICAL DATA:  Fever, chills, shortness of breath, weakness EXAM: PORTABLE CHEST 1 VIEW COMPARISON:  Portable exam 1101 hours compared to 11/08/2016 FINDINGS: Enlargement of cardiac silhouette. Mediastinal contours and pulmonary vascularity normal. Minimal RIGHT basilar atelectasis. Lungs otherwise clear. No infiltrate, pleural effusion, or pneumothorax. IMPRESSION: Enlargement of cardiac silhouette with minimal RIGHT basilar atelectasis. Electronically Signed   By: Lavonia Dana M.D.   On: 03/05/2022 11:18      Assessment/Plan Principal Problem:   Hypertensive urgency Active Problems:   Myocardial injury   Acute renal failure superimposed on stage 3a chronic kidney disease (HCC)   Cough   Hypokalemia   Type II diabetes mellitus with renal manifestations (HCC)   Obesity, Class III, BMI 40-49.9 (morbid obesity) (HCC)   OSA on CPAP   Principal Problem:   Hypertensive urgency Active Problems:   Myocardial injury   Acute renal failure superimposed on stage 3a chronic kidney disease (HCC)   Cough   Hypokalemia   Type II diabetes mellitus with renal manifestations (HCC)   Obesity, Class III, BMI 40-49.9 (morbid obesity) (HCC)   OSA on CPAP   Assessment and Plan: * Hypertensive urgency Initial blood pressure 259/122, which improved to 132/60 after given 10 mg of labetalol by EDP.  This is most likely due to medication noncompliance.  -Placed on telemetry bed for observation -IV hydralazine as needed -Start amlodipine 10 mg daily -Did counseling about importance of taking medications consistently  Myocardial injury Troponin level 73, 79, denies chest pain, likely due to elevated blood pressure  -Aspirin 81 mg daily -Trend troponin -Check A1c, FLP -Check UDS -prn  NTG  Acute renal failure superimposed on stage 3a chronic kidney disease (HCC) Renal ultrasound is negative for obstruction. -Hold Cozaar and Voltaren gel -IV fluid: 75 cc/h of normal saline -Message sent to Dr. Holley Raring of renal for consultation  Cough Etiology is not clear.  Patient reports subjective fever, but his temperature is 98.9 in the emergency room.  No leukocytosis.  Chest x-ray showed mild right basilar atelectasis does not seem to have pneumonia.  COVID PCR negative.  May be due to upper  respiratory infection. -Start Z-Pak empirically -As needed albuterol and Mucinex   Hypokalemia Potassium 3.3 -Repleted potassium -Check magnesium level  Type II diabetes mellitus with renal manifestations (HCC) Diet-controlled diabetes. Recent A1c 5.8, well controlled.  Patient is not taking medications. -Check CBG every morning  Obesity, Class III, BMI 40-49.9 (morbid obesity) (HCC)  BMI=42.77   and BW=120.2 -Diet and exercise.   -Encouraged to lose weight.              DVT ppx: SQ Heparin    Code Status: Full code  Family Communication:  Yes, patient's  wife  at bed side.   Disposition Plan:  Anticipate discharge back to previous environment  Consults called:  Message sent to Dr. Holley Raring of renal for consultation.  Admission status and Level of care: Telemetry Cardiac:    for obs      Severity of Illness:  The appropriate patient status for this patient is OBSERVATION. Observation status is judged to be reasonable and necessary in order to provide the required intensity of service to ensure the patient's safety. The patient's presenting symptoms, physical exam findings, and initial radiographic and laboratory data in the context of their medical condition is felt to place them at decreased risk for further clinical deterioration. Furthermore, it is anticipated that the patient will be medically stable for discharge from the hospital within 2 midnights of  admission.        Date of Service 03/05/2022    Ivor Costa [facility]   If 7PM-7AM, please contact night-coverage [email or web address] 03/05/2022, 6:48 PM

## 2022-03-06 ENCOUNTER — Observation Stay (HOSPITAL_COMMUNITY)
Admit: 2022-03-06 | Discharge: 2022-03-06 | Disposition: A | Discharge status: Home / Self Care | Payer: 59 | Attending: Internal Medicine | Admitting: Internal Medicine

## 2022-03-06 DIAGNOSIS — J9811 Atelectasis: Secondary | ICD-10-CM | POA: Diagnosis present

## 2022-03-06 DIAGNOSIS — G4733 Obstructive sleep apnea (adult) (pediatric): Secondary | ICD-10-CM | POA: Diagnosis present

## 2022-03-06 DIAGNOSIS — J40 Bronchitis, not specified as acute or chronic: Secondary | ICD-10-CM

## 2022-03-06 DIAGNOSIS — Z8249 Family history of ischemic heart disease and other diseases of the circulatory system: Secondary | ICD-10-CM | POA: Diagnosis not present

## 2022-03-06 DIAGNOSIS — N179 Acute kidney failure, unspecified: Secondary | ICD-10-CM | POA: Diagnosis present

## 2022-03-06 DIAGNOSIS — I5021 Acute systolic (congestive) heart failure: Secondary | ICD-10-CM

## 2022-03-06 DIAGNOSIS — R319 Hematuria, unspecified: Secondary | ICD-10-CM | POA: Diagnosis present

## 2022-03-06 DIAGNOSIS — I160 Hypertensive urgency: Secondary | ICD-10-CM | POA: Diagnosis present

## 2022-03-06 DIAGNOSIS — Z20822 Contact with and (suspected) exposure to covid-19: Secondary | ICD-10-CM | POA: Diagnosis present

## 2022-03-06 DIAGNOSIS — M170 Bilateral primary osteoarthritis of knee: Secondary | ICD-10-CM | POA: Diagnosis present

## 2022-03-06 DIAGNOSIS — E875 Hyperkalemia: Secondary | ICD-10-CM | POA: Diagnosis present

## 2022-03-06 DIAGNOSIS — Z833 Family history of diabetes mellitus: Secondary | ICD-10-CM | POA: Diagnosis not present

## 2022-03-06 DIAGNOSIS — I5031 Acute diastolic (congestive) heart failure: Secondary | ICD-10-CM

## 2022-03-06 DIAGNOSIS — I161 Hypertensive emergency: Secondary | ICD-10-CM | POA: Diagnosis present

## 2022-03-06 DIAGNOSIS — Z801 Family history of malignant neoplasm of trachea, bronchus and lung: Secondary | ICD-10-CM | POA: Diagnosis not present

## 2022-03-06 DIAGNOSIS — E876 Hypokalemia: Secondary | ICD-10-CM | POA: Diagnosis present

## 2022-03-06 DIAGNOSIS — Z91148 Patient's other noncompliance with medication regimen for other reason: Secondary | ICD-10-CM | POA: Diagnosis not present

## 2022-03-06 DIAGNOSIS — D631 Anemia in chronic kidney disease: Secondary | ICD-10-CM | POA: Diagnosis present

## 2022-03-06 DIAGNOSIS — I5A Non-ischemic myocardial injury (non-traumatic): Secondary | ICD-10-CM | POA: Diagnosis present

## 2022-03-06 DIAGNOSIS — I130 Hypertensive heart and chronic kidney disease with heart failure and stage 1 through stage 4 chronic kidney disease, or unspecified chronic kidney disease: Secondary | ICD-10-CM | POA: Diagnosis present

## 2022-03-06 DIAGNOSIS — N1831 Chronic kidney disease, stage 3a: Secondary | ICD-10-CM | POA: Diagnosis present

## 2022-03-06 DIAGNOSIS — E1122 Type 2 diabetes mellitus with diabetic chronic kidney disease: Secondary | ICD-10-CM | POA: Diagnosis present

## 2022-03-06 DIAGNOSIS — R778 Other specified abnormalities of plasma proteins: Secondary | ICD-10-CM | POA: Diagnosis not present

## 2022-03-06 DIAGNOSIS — Z6841 Body Mass Index (BMI) 40.0 and over, adult: Secondary | ICD-10-CM | POA: Diagnosis not present

## 2022-03-06 LAB — BASIC METABOLIC PANEL
Anion gap: 9 (ref 5–15)
BUN: 36 mg/dL — ABNORMAL HIGH (ref 6–20)
CO2: 22 mmol/L (ref 22–32)
Calcium: 8.7 mg/dL — ABNORMAL LOW (ref 8.9–10.3)
Chloride: 110 mmol/L (ref 98–111)
Creatinine, Ser: 3.66 mg/dL — ABNORMAL HIGH (ref 0.61–1.24)
GFR, Estimated: 19 mL/min — ABNORMAL LOW (ref >60)
Glucose, Bld: 99 mg/dL (ref 70–99)
Potassium: 3.4 mmol/L — ABNORMAL LOW (ref 3.5–5.1)
Sodium: 141 mmol/L (ref 135–145)

## 2022-03-06 LAB — ECHOCARDIOGRAM COMPLETE
AR max vel: 2.7 cm2
AV Area VTI: 2.91 cm2
AV Area mean vel: 2.55 cm2
AV Mean grad: 6 mmHg
AV Peak grad: 10.6 mmHg
Ao pk vel: 1.63 m/s
Area-P 1/2: 3.2 cm2
Height: 66 in
S' Lateral: 2.91 cm
Weight: 4240 oz

## 2022-03-06 LAB — HIV ANTIBODY (ROUTINE TESTING W REFLEX): HIV Screen 4th Generation wRfx: NONREACTIVE

## 2022-03-06 LAB — TSH: TSH: 3.779 u[IU]/mL (ref 0.350–4.500)

## 2022-03-06 LAB — PROTEIN / CREATININE RATIO, URINE
Creatinine, Urine: 17 mg/dL
Protein Creatinine Ratio: 5.18 mg/mg{Cre} — ABNORMAL HIGH (ref 0.00–0.15)
Total Protein, Urine: 88 mg/dL

## 2022-03-06 LAB — BRAIN NATRIURETIC PEPTIDE: B Natriuretic Peptide: 127.7 pg/mL — ABNORMAL HIGH (ref 0.0–100.0)

## 2022-03-06 LAB — GLUCOSE, CAPILLARY: Glucose-Capillary: 132 mg/dL — ABNORMAL HIGH (ref 70–99)

## 2022-03-06 MED ORDER — FUROSEMIDE 10 MG/ML IJ SOLN
20.0000 mg | Freq: Two times a day (BID) | INTRAMUSCULAR | Status: DC
  Administered 2022-03-06 – 2022-03-07 (×2): 20 mg via INTRAVENOUS
  Filled 2022-03-06 (×3): qty 2

## 2022-03-06 MED ORDER — FUROSEMIDE 10 MG/ML IJ SOLN
40.0000 mg | Freq: Once | INTRAMUSCULAR | Status: AC
  Administered 2022-03-06: 40 mg via INTRAVENOUS
  Filled 2022-03-06: qty 4

## 2022-03-06 MED ORDER — ISOSORBIDE MONONITRATE ER 30 MG PO TB24
30.0000 mg | ORAL_TABLET | Freq: Every day | ORAL | Status: DC
  Administered 2022-03-06 – 2022-03-07 (×2): 30 mg via ORAL
  Filled 2022-03-06 (×2): qty 1

## 2022-03-06 MED ORDER — HYDRALAZINE HCL 50 MG PO TABS
100.0000 mg | ORAL_TABLET | Freq: Three times a day (TID) | ORAL | Status: DC
  Administered 2022-03-06 – 2022-03-08 (×7): 100 mg via ORAL
  Filled 2022-03-06 (×7): qty 2

## 2022-03-06 NOTE — Progress Notes (Signed)
[facility] Progress Note  Patient: Devon Rios    GEX:[id number]  DOA: 03/05/2022    Date of Service: the patient was seen and examined on 03/06/2022  Brief hospital course: 56 year old male with past medical history of morbid obesity, hypertension, diabetes mellitus, obstructive sleep apnea and stage III chronic kidney disease presented to the emergency room with cough, shortness of breath and subjective fever.  In the emergency room, patient found to have markedly elevated blood pressure with initial blood pressure of 259/122, acute kidney injury with creatinine 3.88 and GFR 17 and elevated procalcitonin level of 0.9.  Assessment and Plan: Assessment and Plan: * Hypertensive urgency Patient has been without his blood pressure medicines x2 weeks.  His PCP had retired and he had not yet established with a new one.  This was likely the triggering point for his CHF which only worsened his blood pressure.  I have started p.o. Imdur, p.o. hydralazine and IV Lasix.   Acute diastolic (congestive) heart failure (HCC) BNP mildly elevated.  Echocardiogram notes grade 1 diastolic dysfunction.  Have started some education about weighing himself daily, taking medications and over time, checking sodium content  Type II diabetes mellitus with renal manifestations ([place]) Diet-controlled diabetes.  A1c pending.  Elevated troponin Elevated troponins secondary to CHF.  No evidence of ACS.  Acute renal failure superimposed on stage 3a chronic kidney disease ([place]) Renal ultrasound is negative for obstruction. Held Cozaar and Voltaren gel.  Secondary in part due to uncontrolled hypertension and some heart failure.  Patient has never had a nephrologist before.  Last creatinine on record at 1.452 years ago.  On arrival at 3.88.  Will monitor while on Lasix.  Down 3.66 today.  Nephrology following and will also see him as outpatient.  Hypokalemia Replacing as needed.  Magnesium level  normal.  Bronchitis Etiology is not clear.  Noted subjective fever x1 week.  Cough only started 2 days ago which may be more likely due to the heart failure.  Procalcitonin on admission at 0.91, so have started Zithromax.  OSA on CPAP Patient remains compliant with his CPAP, he did bring his machine here  Obesity, Class III, BMI 40-49.9 (morbid obesity) (HCC) Meets criteria for BMI greater than 40        Body mass index is 42.77 kg/m.        Consultants: Nephrology  Procedures: Echocardiogram noting grade 1 diastolic dysfunction  Antimicrobials: Zithromax 7/3-present  Code Status: Full code   Subjective: Patient states he is feeling a bit better, less headache  Objective: Blood pressure still significantly elevated although improved from previous day Vitals:   03/06/22 1421 03/06/22 1747  BP: (!) 165/80 (!) 178/100  Pulse: 99   Resp: 20 20  Temp:    SpO2: 98% 97%    Intake/Output Summary (Last 24 hours) at 03/06/2022 1818 Last data filed at 03/06/2022 0505 Gross per 24 hour  Intake 1155.85 ml  Output --  Net 1155.85 ml   Filed Weights   03/05/22 1046  Weight: 120.2 kg   Body mass index is 42.77 kg/m.  Exam:  General: Alert and oriented x3, no acute distress HEENT: Normocephalic atraumatic, mucous membranes are moist, narrow airway Cardiovascular: Regular rate and rhythm, S1-S2 Respiratory: Clear to auscultation bilaterally Abdomen: Soft, nontender, nondistended with positive bowel sounds Musculoskeletal: Clubbing or cyanosis or edema Skin: No skin break, tears or lesions Psychiatry: Appropriate, no evidence of psychoses Neurology: No focal deficits  Data Reviewed: Improvement of creatinine to 3.66.  BNP  of 128.  Troponin down to 75  Disposition:  Status is: Inpatient Remains inpatient appropriate because: Improvement in blood pressure and further diuresis    Anticipated discharge date: 7/4 or 7/5  Family Communication: Wife at the  bedside DVT Prophylaxis: heparin injection 5,000 Units Start: 03/05/22 2200    Author: Annita Brod ,MD 03/06/2022 6:18 PM  To reach On-call, see care teams to locate the attending and reach out via [email or web address]. Between 7PM-7AM, please contact night-coverage If you still have difficulty reaching the attending provider, please page the [facility] (Director on Call) for [facility] on amion for assistance.

## 2022-03-06 NOTE — Assessment & Plan Note (Signed)
Patient remains compliant with his CPAP, he did bring his machine here

## 2022-03-06 NOTE — Consult Note (Signed)
[facility]  CONSULT NOTE    Date: 03/06/2022                  Patient Name:  Devon Rios  MRN: [id number]  DOB: 26-May-1966  Age / Sex: 56 y.o., male         PCP: Jearld Fenton, NP                 Service Requesting Consult: [place]                 Reason for Consult: Acute Kidney Injury            History of Present Illness: Mr. Keithen Capo is a 56 y.o.  male with past medical history of diabetes, hypertension, obesity, OSA with CPAP and chronic kidney disease, who was admitted to [facility] on 03/05/2022 for Hypertensive urgency [I16.0] AKI (acute kidney injury) ([place]) [N17.9] Fever, unspecified fever cause [R50.9] Dyspnea, unspecified type [R06.00]  Patient states he began to feel sick a week ago, developed fever, chills and fatigue. He reports taking Advil for body aches. Reports dry cough started on Saturday and persisted throughout the weekend. Denies history of kidney disease with self or family. States he drinks plenty of water, 60+ oz, daily and walks 2 miles daily with dogs. States he maintained fluid intake but decreased flood intake due to generalized malaise and took Advil. He also states he ran out of Losartan and due to reconstruction at his PCP, he has not followed up to have an appt for refill. He was told on presentation to the urgent that his pressure was elevated and would need to seek treatment from ED. Denies shortness of breath. Denies diarrhea or vomiting.   Labs on ED arrival significant for potassium 3.3, BUN 38, creatinine 3.88 with GFR 17. UA positive for hematuria and proteuria. Chest xray shows  an enlarged cardiac silhouette with right basilar atelectasis. Renal ultrasound shows right simple cyst, but no obstruction.    Medications: Outpatient medications: Medications Prior to Admission  Medication Sig Dispense Refill Last Dose   Calcium 500 MG CHEW Chew 1 each by mouth 2 (two) times daily.    03/05/2022   losartan (COZAAR) 50 MG tablet Take  1 tablet (50 mg total) by mouth daily. 90 tablet 1 Past Week   Multiple Vitamins-Minerals (CELEBRATE MULTI-COMPLETE 45) CAPS Take 1 each by mouth 2 (two) times daily.    03/05/2022   Acetaminophen (TYLENOL ARTHRITIS PAIN PO) Take by mouth.   prn at prn   Blood Glucose Monitoring Suppl (BAYER CONTOUR MONITOR) w/Device KIT Check blood sugar twice a day and as directed Dx E11.9 1 kit 0    diclofenac Sodium (VOLTAREN) 1 % GEL Apply topically 4 (four) times daily.   prn at prn   Misc Natural Products (COLON CLEANSE PO) Take by mouth. As Needed (Patient not taking: Reported on 03/05/2022)   Not Taking   ONETOUCH DELICA LANCETS 86P MISC 1 each by Does not apply route 2 (two) times daily. Dx E11.9 100 each 6     Current medications: Current Facility-Administered Medications  Medication Dose Route Frequency Provider Last Rate Last Admin   acetaminophen (TYLENOL) tablet 650 mg  650 mg Oral Q6H PRN Ivor Costa, MD   650 mg at 03/05/22 1857   albuterol (PROVENTIL) (2.5 MG/3ML) 0.083% nebulizer solution 2.5 mg  2.5 mg Nebulization Q4H PRN Ivor Costa, MD       aspirin EC tablet 81 mg  81 mg Oral Daily Ivor Costa, MD   81 mg at 03/06/22 0802   azithromycin [facility]) tablet 250 mg  250 mg Oral Daily Ivor Costa, MD   250 mg at 03/06/22 0800   calcium carbonate (TUMS - dosed in mg elemental calcium) chewable tablet 200 mg of elemental calcium  200 mg of elemental calcium Oral BID Ivor Costa, MD   200 mg of elemental calcium at 03/06/22 0802   dextromethorphan-guaiFENesin ([place] DM) 30-600 MG per 12 hr tablet 1 tablet  1 tablet Oral BID PRN Ivor Costa, MD       heparin injection 5,000 Units  5,000 Units Subcutaneous Q8H Ivor Costa, MD   5,000 Units at 03/06/22 1422   hydrALAZINE (APRESOLINE) injection 5 mg  5 mg Intravenous Q2H PRN Ivor Costa, MD   5 mg at 03/06/22 2947   hydrALAZINE (APRESOLINE) tablet 100 mg  100 mg Oral Q8H Annita Brod, MD   100 mg at 03/06/22 1014   isosorbide mononitrate (IMDUR) 24 hr  tablet 30 mg  30 mg Oral Daily Annita Brod, MD   30 mg at 03/06/22 1014   multivitamin with minerals tablet 1 tablet  1 tablet Oral BID Ivor Costa, MD   1 tablet at 03/06/22 0802   nitroGLYCERIN (NITROSTAT) SL tablet 0.4 mg  0.4 mg Sublingual Q5 min PRN Ivor Costa, MD       ondansetron [facility]) injection 4 mg  4 mg Intravenous Q8H PRN Ivor Costa, MD          Allergies: No Known Allergies    Past Medical History: Past Medical History:  Diagnosis Date   Arthritis    oa left knee worse than right   DM2 (diabetes mellitus, type 2) ([place]) 07/21/2014   H/O HAD GASTRIC SLEEVE IN 2018-LOST 150 LBS AND NOW IS UNDER CONTROL WITH DIET AND EXERCISE   Frequent headaches    weekly   Hypertension    H/O BEEN OF BP MEDS 06-2018 DUE TO CONTROL -PT HAD GASTRIC SLEEVE SURGERY AND LOST 150 LBS AND NOW ITS CONTROLLED WITH DIET AND EXERCISE   OSA on CPAP 2013   Severe obesity (BMI >= 40) ([place]) 07/14/2014     Past Surgical History: Past Surgical History:  Procedure Laterality Date   COLONOSCOPY WITH PROPOFOL N/A 05/13/2020   Procedure: COLONOSCOPY WITH PROPOFOL;  Surgeon: Jonathon Bellows, MD;  Location: [facility] ENDOSCOPY;  Service: Gastroenterology;  Laterality: N/A;   coloscopy  age 76   INCISION AND DRAINAGE ABSCESS N/A 09/24/2018   Procedure: INCISION AND DRAINAGE OF CYST, POSTERIOR SCALP;  Surgeon: Benjamine Sprague, DO;  Location: [facility] ORS;  Service: General;  Laterality: N/A;   LAPAROSCOPIC GASTRIC SLEEVE RESECTION N/A 06/11/2017   Procedure: LAPAROSCOPIC GASTRIC SLEEVE RESECTION WITH UPPER ENDO;  Surgeon: Johnathan Hausen, MD;  Location: [facility] ORS;  Service: General;  Laterality: N/A;   left knee fluid drained and cortisone injection  03/2017   MENISCUS REPAIR Right    Right knee   VASECTOMY       Family History: Family History  Problem Relation Age of Onset   Hypertension Mother    Diabetes Mother    Cancer Maternal Grandmother        Lung   Stroke Neg Hx      Social History: Social  History   Socioeconomic History   Marital status: Married    Spouse name: Not on file   Number of children: Not on file   Years of education: Not on  file   Highest education level: Not on file  Occupational History   Not on file  Tobacco Use   Smoking status: Never   Smokeless tobacco: Never  Vaping Use   Vaping Use: Never used  Substance and Sexual Activity   Alcohol use: Not Currently    Alcohol/week: 0.0 standard drinks of alcohol   Drug use: No   Sexual activity: Yes  Other Topics Concern   Not on file  Social History Narrative   Not on file   Social Determinants of Health   Financial Resource Strain: Not on file  Food Insecurity: Not on file  Transportation Needs: Not on file  Physical Activity: Not on file  Stress: Not on file  Social Connections: Not on file  Intimate Partner Violence: Not on file     Review of Systems: Review of Systems  Constitutional:  Positive for chills, fever and malaise/fatigue.  HENT:  Negative for congestion, sore throat and tinnitus.   Eyes:  Negative for blurred vision and redness.  Respiratory:  Positive for cough. Negative for shortness of breath and wheezing.   Cardiovascular:  Negative for chest pain, palpitations, claudication and leg swelling.  Gastrointestinal:  Negative for abdominal pain, blood in stool, diarrhea, nausea and vomiting.  Genitourinary:  Negative for flank pain, frequency and hematuria.  Musculoskeletal:  Negative for back pain, falls and myalgias.  Skin:  Negative for rash.  Neurological:  Negative for dizziness, weakness and headaches.  Endo/Heme/Allergies:  Does not bruise/bleed easily.  Psychiatric/Behavioral:  Negative for depression. The patient is not nervous/anxious and does not have insomnia.     Vital Signs: Blood pressure (!) 165/80, pulse 99, temperature 98.9 F (37.2 C), resp. rate 20, height 5' 6" (1.676 m), weight 120.2 kg, SpO2 98 %.  Weight trends: Filed Weights   03/05/22 1046   Weight: 120.2 kg    Physical Exam: General: NAD  Head: Normocephalic, atraumatic. Moist oral mucosal membranes  Eyes: Anicteric  Lungs:  Clear to auscultation, normal effort, room air  Heart: Regular rate and rhythm  Abdomen:  Soft, nontender, obese  Extremities:  trace peripheral edema.  Neurologic: Nonfocal, moving all four extremities  Skin: No lesions  Access: None     Lab results: Basic Metabolic Panel: Recent Labs  Lab 03/05/22 1102 03/05/22 1251 03/06/22 0453  NA 140  --  141  K 3.3*  --  3.4*  CL 107  --  110  CO2 22  --  22  GLUCOSE 100*  --  99  BUN 38*  --  36*  CREATININE 3.88*  --  3.66*  CALCIUM 8.8*  --  8.7*  MG  --  2.3  --     Liver Function Tests: Recent Labs  Lab 03/05/22 1102  AST 31  ALT 24  ALKPHOS 73  BILITOT 0.8  PROT 7.8  ALBUMIN 3.5   No results for input(s): "LIPASE", "AMYLASE" in the last 168 hours. No results for input(s): "AMMONIA" in the last 168 hours.  CBC: Recent Labs  Lab 03/05/22 1102  WBC 6.1  NEUTROABS 4.1  HGB 12.6*  HCT 38.6*  MCV 86.0  PLT 183    Cardiac Enzymes: No results for input(s): "CKTOTAL", "CKMB", "CKMBINDEX", "TROPONINI" in the last 168 hours.  BNP: Invalid input(s): "POCBNP"  CBG: Recent Labs  Lab 03/06/22 1223  GLUCAP 132*    Microbiology: Results for orders placed or performed during the hospital encounter of 03/05/22  Resp Panel by RT-PCR (Flu A&B, Covid)  Anterior Nasal Swab     Status: None   Collection Time: 03/05/22 11:02 AM   Specimen: Anterior Nasal Swab  Result Value Ref Range Status   SARS Coronavirus 2 by RT PCR NEGATIVE NEGATIVE Final    Comment: (NOTE) SARS-CoV-2 target nucleic acids are NOT DETECTED.  The SARS-CoV-2 RNA is generally detectable in upper respiratory specimens during the acute phase of infection. The lowest concentration of SARS-CoV-2 viral copies this assay can detect is 138 copies/mL. A negative result does not preclude SARS-Cov-2 infection and  should not be used as the sole basis for treatment or other patient management decisions. A negative result may occur with  improper specimen collection/handling, submission of specimen other than nasopharyngeal swab, presence of viral mutation(s) within the areas targeted by this assay, and inadequate number of viral copies(<138 copies/mL). A negative result must be combined with clinical observations, patient history, and epidemiological information. The expected result is Negative.  Fact Sheet for Patients:  EntrepreneurPulse.com.au  Fact Sheet for Healthcare Providers:  IncredibleEmployment.be  This test is no t yet approved or cleared by the [place] FDA and  has been authorized for detection and/or diagnosis of SARS-CoV-2 by FDA under an Emergency Use Authorization (EUA). This EUA will remain  in effect (meaning this test can be used) for the duration of the COVID-19 declaration under Section 564(b)(1) of the Act, 21 U.S.C.section 360bbb-3(b)(1), unless the authorization is terminated  or revoked sooner.       Influenza A by PCR NEGATIVE NEGATIVE Final   Influenza B by PCR NEGATIVE NEGATIVE Final    Comment: (NOTE) The Xpert Xpress SARS-CoV-2/FLU/RSV plus assay is intended as an aid in the diagnosis of influenza from Nasopharyngeal swab specimens and should not be used as a sole basis for treatment. Nasal washings and aspirates are unacceptable for Xpert Xpress SARS-CoV-2/FLU/RSV testing.  Fact Sheet for Patients: EntrepreneurPulse.com.au  Fact Sheet for Healthcare Providers: IncredibleEmployment.be  This test is not yet approved or cleared by the [place] FDA and has been authorized for detection and/or diagnosis of SARS-CoV-2 by FDA under an Emergency Use Authorization (EUA). This EUA will remain in effect (meaning this test can be used) for the duration of the COVID-19 declaration  under Section 564(b)(1) of the Act, 21 U.S.C. section 360bbb-3(b)(1), unless the authorization is terminated or revoked.  Performed at [facility], [address] [place], [place] [address]   Culture, blood (routine x 2)     Status: None (Preliminary result)   Collection Time: 03/05/22 11:02 AM   Specimen: BLOOD  Result Value Ref Range Status   Specimen Description BLOOD RIGHT ANTECUBITAL  Final   Special Requests   Final    BOTTLES DRAWN AEROBIC AND ANAEROBIC Blood Culture adequate volume   Culture   Final    NO GROWTH < 24 HOURS Performed at [facility], [address] [place], [place] [address]    Report Status PENDING  Incomplete  Culture, blood (Routine X 2) w Reflex to ID Panel     Status: None (Preliminary result)   Collection Time: 03/05/22  9:39 PM   Specimen: BLOOD  Result Value Ref Range Status   Specimen Description BLOOD LEFT AC  Final   Special Requests   Final    BOTTLES DRAWN AEROBIC AND ANAEROBIC Blood Culture adequate volume   Culture   Final    NO GROWTH < 12 HOURS Performed at [facility], [address] [place], [place] [address]  Report Status PENDING  Incomplete    Coagulation Studies: Recent Labs    03/05/22 1932  LABPROT 15.1  INR 1.2    Urinalysis: Recent Labs    03/05/22 1102  COLORURINE YELLOW*  LABSPEC 1.008  PHURINE 6.0  GLUCOSEU NEGATIVE  HGBUR MODERATE*  BILIRUBINUR NEGATIVE  KETONESUR 5*  PROTEINUR >=300*  NITRITE NEGATIVE  LEUKOCYTESUR NEGATIVE      Imaging: US Renal  Result Date: 03/05/2022 CLINICAL DATA:  New onset AKI with fever EXAM: RENAL / URINARY TRACT ULTRASOUND COMPLETE COMPARISON:  None Available. FINDINGS: Right Kidney: Renal measurements: 8.7 x 5.2 x 4.3 cm = volume: 103 mL. Echogenicity increased. A 1 x 1.2 x 1.1 cm cystic lesion likely representing a simple renal cyst. Simple renal cysts, in the absence of clinically indicated signs/symptoms, require no independent  follow-up. No solid mass or hydronephrosis visualized. Left Kidney: Renal measurements: 9.1 x 4.9 x 4.7 cm = volume: 109 mL. Echogenicity increased. No mass or hydronephrosis visualized. Urinary bladder: Not visualized-empty. Other: None. IMPRESSION: Increased renal parenchymal echogenicity suggestive of renal parenchymal disease. Electronically Signed   By: Iven Finn M.D.   On: 03/05/2022 15:20   DG Chest Portable 1 View  Result Date: 03/05/2022 CLINICAL DATA:  Fever, chills, shortness of breath, weakness EXAM: PORTABLE CHEST 1 VIEW COMPARISON:  Portable exam 1101 hours compared to 11/08/2016 FINDINGS: Enlargement of cardiac silhouette. Mediastinal contours and pulmonary vascularity normal. Minimal RIGHT basilar atelectasis. Lungs otherwise clear. No infiltrate, pleural effusion, or pneumothorax. IMPRESSION: Enlargement of cardiac silhouette with minimal RIGHT basilar atelectasis. Electronically Signed   By: Lavonia Dana M.D.   On: 03/05/2022 11:18     Assessment & Plan: Mr. Selden Noteboom is a 56 y.o.  male with past medical history of diabetes, hypertension, obesity, OSA with CPAP and chronic kidney disease, who was admitted to [facility] on 03/05/2022 for Hypertensive urgency [I16.0] AKI (acute kidney injury) ([place]) [N17.9] Fever, unspecified fever cause [R50.9] Dyspnea, unspecified type [R06.00]  Acute kidney injury with hypokalemia/hematuria/proteinuria on chronic kidney disease stage II with GFR 61 on 03/04/20. Acute kidney injury appears multifactorial from poor oral intake, NSAID use and hypertension. Renal ultrasound negative for obstruction. Agree with held Losartan. No acute need for dialysis at this time. Will order ANA, Creatinine-protein ratio, SPEP, ANCA, and GBM to evaluate abnormal UA. Continue to avoid nephrotoxic agents and therapies, if possible. Patient may require renal biopsy to evaluate cause and extent of kidney injury. Will need improved blood pressure control for this procedure.  Continue to encourage oral intake provide supportive care as needed. Oral potassium supplementation ordered.   2. Hypertensive emergency, Blood pressure 259/122 on admission. Home regimen includes Losartan 51m daily. Currently held. Receiving Amlodipine. Blood pressure controlled overnight, but increased this morning to 209/110. Primary team has added Hydralazine, isosorbide and furosemide. Blood pressure 165/80. May consider Carvedilol also.   3 Anemia of chronic kidney disease Lab Results  Component Value Date   HGB 12.6 (L) 03/05/2022    Hgb acceptable. Will continue to monitor    LOS: 0 Shantelle Breeze 7/3/20232:26 PM

## 2022-03-06 NOTE — TOC Initial Note (Signed)
Transition of Care [facility]) - Initial/Assessment Note    Patient Details  Name: Devon Rios MRN: [id number] Date of Birth: 05/22/1966  Transition of Care [facility]) CM/SW Contact:    Laurena Slimmer, RN Phone Number: 03/06/2022, 3:02 PM  Clinical Narrative:                  Transition of Care [facility]) Screening Note   Patient Details  Name: Devon Rios Date of Birth: December 13, 1965   Transition of Care [facility]) CM/SW Contact:    Laurena Slimmer, RN Phone Number: 03/06/2022, 3:02 PM    Transition of Care Department [facility]) has reviewed patient and no TOC needs have been identified at this time. We will continue to monitor patient advancement through interdisciplinary progression rounds. If new patient transition needs arise, please place a TOC consult.          Patient Goals and CMS Choice        Expected Discharge Plan and Services                                                Prior Living Arrangements/Services                       Activities of Daily Living      Permission Sought/Granted                  Emotional Assessment              Admission diagnosis:  Hypertensive urgency [I16.0] AKI (acute kidney injury) ([place]) [N17.9] Fever, unspecified fever cause [R50.9] Dyspnea, unspecified type [R06.00] Patient Active Problem List   Diagnosis Date Noted   Hypertensive urgency 03/05/2022   Type II diabetes mellitus with renal manifestations ([place]) 03/05/2022   Acute renal failure superimposed on stage 3a chronic kidney disease ([place]) 03/05/2022   Hypokalemia 03/05/2022   Obesity, Class III, BMI 40-49.9 (morbid obesity) ([place]) 03/05/2022   Myocardial injury 03/05/2022   Primary osteoarthritis of right knee 01/09/2020   Morbid obesity with BMI of 45.0-49.9, adult ([place]) 01/09/2020   Abscess, scalp 09/26/2018   Osteoarthritis 08/24/2017   Cough 03/08/2015   OSA on CPAP    Diabetes mellitus without complication ([place]) 31/49/7026   Essential  hypertension 07/14/2014   PCP:  Jearld Fenton, NP Pharmacy:   [facility] [address] [place] [facility] [place] [address] [place] [address] Phone: [phone] Fax: [phone]  [id number] [place], [place] [place] [address] Phone: [phone] Fax: [phone]     Social Determinants of Health (SDOH) Interventions    Readmission Risk Interventions     No data to display

## 2022-03-06 NOTE — Assessment & Plan Note (Addendum)
BNP mildly elevated.  Echocardiogram notes grade 1 diastolic dysfunction.  Have started some education about weighing himself daily, taking medications and over time, checking sodium content.  Despite Lasix, only diuresed less than 1 L.  Given increasing creatinine, he is likely over diuresed and so getting some gentle IV fluids on day of discharge.  Patient counseled about weighing himself daily.

## 2022-03-06 NOTE — Hospital Course (Signed)
56 year old male with past medical history of morbid obesity, hypertension, diabetes mellitus, obstructive sleep apnea and stage III chronic kidney disease presented to the emergency room with cough, shortness of breath and subjective fever.  In the emergency room, patient found to have markedly elevated blood pressure with initial blood pressure of 259/122, acute kidney injury with creatinine 3.88 and GFR 17 and elevated procalcitonin level of 0.9.  Patient had been without his blood pressure medicines for several weeks and these were restarted.  Started on some Lasix and echocardiogram only noted mild diastolic dysfunction.  Blood pressure remains difficult to control.  Creatinine which had improved on 7/3 trended back up to 3.82 on 7/4.  Nephrology recommending renal biopsy.

## 2022-03-07 DIAGNOSIS — N179 Acute kidney failure, unspecified: Secondary | ICD-10-CM | POA: Diagnosis not present

## 2022-03-07 DIAGNOSIS — I160 Hypertensive urgency: Secondary | ICD-10-CM | POA: Diagnosis not present

## 2022-03-07 DIAGNOSIS — G4733 Obstructive sleep apnea (adult) (pediatric): Secondary | ICD-10-CM | POA: Diagnosis not present

## 2022-03-07 LAB — ANA W/REFLEX IF POSITIVE: Anti Nuclear Antibody (ANA): NEGATIVE

## 2022-03-07 LAB — CBC
HCT: 38.8 % — ABNORMAL LOW (ref 39.0–52.0)
Hemoglobin: 12.7 g/dL — ABNORMAL LOW (ref 13.0–17.0)
MCH: 27.7 pg (ref 26.0–34.0)
MCHC: 32.7 g/dL (ref 30.0–36.0)
MCV: 84.5 fL (ref 80.0–100.0)
Platelets: 242 10*3/uL (ref 150–400)
RBC: 4.59 MIL/uL (ref 4.22–5.81)
RDW: 13.9 % (ref 11.5–15.5)
WBC: 7.1 10*3/uL (ref 4.0–10.5)
nRBC: 0 % (ref 0.0–0.2)

## 2022-03-07 LAB — BASIC METABOLIC PANEL
Anion gap: 10 (ref 5–15)
BUN: 36 mg/dL — ABNORMAL HIGH (ref 6–20)
CO2: 23 mmol/L (ref 22–32)
Calcium: 9.2 mg/dL (ref 8.9–10.3)
Chloride: 105 mmol/L (ref 98–111)
Creatinine, Ser: 3.82 mg/dL — ABNORMAL HIGH (ref 0.61–1.24)
GFR, Estimated: 18 mL/min — ABNORMAL LOW (ref >60)
Glucose, Bld: 123 mg/dL — ABNORMAL HIGH (ref 70–99)
Potassium: 3.5 mmol/L (ref 3.5–5.1)
Sodium: 138 mmol/L (ref 135–145)

## 2022-03-07 LAB — HEMOGLOBIN A1C
Hgb A1c MFr Bld: 6 % — ABNORMAL HIGH (ref 4.8–5.6)
Mean Plasma Glucose: 126 mg/dL

## 2022-03-07 LAB — GLUCOSE, CAPILLARY
Glucose-Capillary: 127 mg/dL — ABNORMAL HIGH (ref 70–99)
Glucose-Capillary: 134 mg/dL — ABNORMAL HIGH (ref 70–99)
Glucose-Capillary: 116 mg/dL — ABNORMAL HIGH (ref 70–99)

## 2022-03-07 MED ORDER — CARVEDILOL 25 MG PO TABS
25.0000 mg | ORAL_TABLET | Freq: Two times a day (BID) | ORAL | Status: DC
  Administered 2022-03-07 – 2022-03-08 (×3): 25 mg via ORAL
  Filled 2022-03-07 (×3): qty 1

## 2022-03-07 MED ORDER — ISOSORBIDE MONONITRATE ER 60 MG PO TB24
60.0000 mg | ORAL_TABLET | Freq: Every day | ORAL | Status: DC
  Administered 2022-03-08: 60 mg via ORAL
  Filled 2022-03-07: qty 1

## 2022-03-07 MED ORDER — ISOSORBIDE MONONITRATE ER 30 MG PO TB24
30.0000 mg | ORAL_TABLET | ORAL | Status: AC
  Administered 2022-03-07: 30 mg via ORAL
  Filled 2022-03-07: qty 1

## 2022-03-07 MED ORDER — CARVEDILOL 6.25 MG PO TABS: 6.2500 mg | ORAL_TABLET | Freq: Two times a day (BID) | ORAL | Status: DC

## 2022-03-07 MED ORDER — AMLODIPINE BESYLATE 10 MG PO TABS
10.0000 mg | ORAL_TABLET | Freq: Every day | ORAL | Status: DC
  Administered 2022-03-07 – 2022-03-08 (×2): 10 mg via ORAL
  Filled 2022-03-07 (×2): qty 1

## 2022-03-07 NOTE — Progress Notes (Signed)
[facility]  ROUNDING NOTE   Subjective:  Patient seen and evaluated today. Sitting up in a chair at the moment. He is a bit frustrated regarding the time is taking to determine the issues at hand. In regards to renal function creatinine currently 3.8 with an EGFR of 18. Blood pressure still a bit high at 165/78.  Objective:  Vital signs in last 24 hours:  Temp:  [97.7 F (36.5 C)-99 F (37.2 C)] 97.9 F (36.6 C) (07/04 1455) Pulse Rate:  [87-107] 107 (07/04 1455) Resp:  [16-21] 16 (07/04 1455) BP: (137-178)/(72-100) 165/78 (07/04 1455) SpO2:  [94 %-100 %] 98 % (07/04 1455)  Weight change:  Filed Weights   03/05/22 1046  Weight: 120.2 kg    Intake/Output: I/O last 3 completed shifts: In: 1435.9 [P.O.:690; I.V.:745.9] Out: -    Intake/Output this shift:  Total I/O In: -  Out: 750 [Urine:750]  Physical Exam: General: No acute distress  Head: Normocephalic, atraumatic. Moist oral mucosal membranes  Neck: Supple  Lungs:  Clear to auscultation, normal effort  Heart: S1S2 no rubs  Abdomen:  Soft, nontender, bowel sounds present  Extremities: Trace peripheral edema.  Neurologic: Awake, alert, following commands  Skin: No acute rash  Access: None    Basic Metabolic Panel: Recent Labs  Lab 03/05/22 1102 03/05/22 1251 03/06/22 0453 03/07/22 0606  NA 140  --  141 138  K 3.3*  --  3.4* 3.5  CL 107  --  110 105  CO2 22  --  22 23  GLUCOSE 100*  --  99 123*  BUN 38*  --  36* 36*  CREATININE 3.88*  --  3.66* 3.82*  CALCIUM 8.8*  --  8.7* 9.2  MG  --  2.3  --   --     Liver Function Tests: Recent Labs  Lab 03/05/22 1102  AST 31  ALT 24  ALKPHOS 73  BILITOT 0.8  PROT 7.8  ALBUMIN 3.5   No results for input(s): "LIPASE", "AMYLASE" in the last 168 hours. No results for input(s): "AMMONIA" in the last 168 hours.  CBC: Recent Labs  Lab 03/05/22 1102 03/07/22 0606  WBC 6.1 7.1  NEUTROABS 4.1  --   HGB 12.6* 12.7*  HCT 38.6* 38.8*  MCV  86.0 84.5  PLT 183 242    Cardiac Enzymes: No results for input(s): "CKTOTAL", "CKMB", "CKMBINDEX", "TROPONINI" in the last 168 hours.  BNP: Invalid input(s): "POCBNP"  CBG: Recent Labs  Lab 03/06/22 1223 03/07/22 0728 03/07/22 1149  GLUCAP 132* 127* 134*    Microbiology: Results for orders placed or performed during the hospital encounter of 03/05/22  Resp Panel by RT-PCR (Flu A&B, Covid) Anterior Nasal Swab     Status: None   Collection Time: 03/05/22 11:02 AM   Specimen: Anterior Nasal Swab  Result Value Ref Range Status   SARS Coronavirus 2 by RT PCR NEGATIVE NEGATIVE Final    Comment: (NOTE) SARS-CoV-2 target nucleic acids are NOT DETECTED.  The SARS-CoV-2 RNA is generally detectable in upper respiratory specimens during the acute phase of infection. The lowest concentration of SARS-CoV-2 viral copies this assay can detect is 138 copies/mL. A negative result does not preclude SARS-Cov-2 infection and should not be used as the sole basis for treatment or other patient management decisions. A negative result may occur with  improper specimen collection/handling, submission of specimen other than nasopharyngeal swab, presence of viral mutation(s) within the areas targeted by this assay, and inadequate number of  viral copies(<138 copies/mL). A negative result must be combined with clinical observations, patient history, and epidemiological information. The expected result is Negative.  Fact Sheet for Patients:  EntrepreneurPulse.com.au  Fact Sheet for Healthcare Providers:  IncredibleEmployment.be  This test is no t yet approved or cleared by the [place] FDA and  has been authorized for detection and/or diagnosis of SARS-CoV-2 by FDA under an Emergency Use Authorization (EUA). This EUA will remain  in effect (meaning this test can be used) for the duration of the COVID-19 declaration under Section 564(b)(1) of the Act,  21 U.S.C.section 360bbb-3(b)(1), unless the authorization is terminated  or revoked sooner.       Influenza A by PCR NEGATIVE NEGATIVE Final   Influenza B by PCR NEGATIVE NEGATIVE Final    Comment: (NOTE) The Xpert Xpress SARS-CoV-2/FLU/RSV plus assay is intended as an aid in the diagnosis of influenza from Nasopharyngeal swab specimens and should not be used as a sole basis for treatment. Nasal washings and aspirates are unacceptable for Xpert Xpress SARS-CoV-2/FLU/RSV testing.  Fact Sheet for Patients: EntrepreneurPulse.com.au  Fact Sheet for Healthcare Providers: IncredibleEmployment.be  This test is not yet approved or cleared by the [place] FDA and has been authorized for detection and/or diagnosis of SARS-CoV-2 by FDA under an Emergency Use Authorization (EUA). This EUA will remain in effect (meaning this test can be used) for the duration of the COVID-19 declaration under Section 564(b)(1) of the Act, 21 U.S.C. section 360bbb-3(b)(1), unless the authorization is terminated or revoked.  Performed at [facility], [address] [place], [place] [address]   Culture, blood (routine x 2)     Status: None (Preliminary result)   Collection Time: 03/05/22 11:02 AM   Specimen: BLOOD  Result Value Ref Range Status   Specimen Description BLOOD RIGHT ANTECUBITAL  Final   Special Requests   Final    BOTTLES DRAWN AEROBIC AND ANAEROBIC Blood Culture adequate volume   Culture   Final    NO GROWTH 2 DAYS Performed at [facility], [address] [place], [place] [address]    Report Status PENDING  Incomplete  Culture, blood (Routine X 2) w Reflex to ID Panel     Status: None (Preliminary result)   Collection Time: 03/05/22  9:39 PM   Specimen: BLOOD  Result Value Ref Range Status   Specimen Description BLOOD LEFT AC  Final   Special Requests   Final    BOTTLES DRAWN AEROBIC AND ANAEROBIC Blood Culture adequate  volume   Culture   Final    NO GROWTH 2 DAYS Performed at [facility], [address], [place], [place] [address]    Report Status PENDING  Incomplete    Coagulation Studies: Recent Labs    03/05/22 1932  LABPROT 15.1  INR 1.2    Urinalysis: Recent Labs    03/05/22 1102  COLORURINE YELLOW*  LABSPEC 1.008  PHURINE 6.0  GLUCOSEU NEGATIVE  HGBUR MODERATE*  BILIRUBINUR NEGATIVE  KETONESUR 5*  PROTEINUR >=300*  NITRITE NEGATIVE  LEUKOCYTESUR NEGATIVE      Imaging: ECHOCARDIOGRAM COMPLETE  Result Date: 03/06/2022    ECHOCARDIOGRAM REPORT   Patient Name:   Devon Rios Date of Exam: 03/06/2022 Medical Rec #:  [id number]   Height:       66.0 in Accession #:    [id number]  Weight:       265.0 lb Date of Birth:  02/06/1966  BSA:  2.254 m Patient Age:    56 years    BP:           189/101 mmHg Patient Gender: M           HR:           95 bpm. Exam Location:  [facility] Procedure: 2D Echo, Cardiac Doppler and Color Doppler Indications:     V91.66 CHF acute systolic  History:         Patient has no prior history of Echocardiogram examinations.                  Risk Factors:Hypertension, Diabetes and Sleep Apnea.  Sonographer:     Rosalia Hammers Referring Phys:  [place] Diagnosing Phys: Kathlyn Sacramento MD  Sonographer Comments: Suboptimal apical window. Image acquisition challenging due to patient body habitus. IMPRESSIONS  1. Left ventricular ejection fraction, by estimation, is 60 to 65%. The left ventricle has normal function. The left ventricle has no regional wall motion abnormalities. There is severe concentric left ventricular hypertrophy. Left ventricular diastolic  parameters are consistent with Grade I diastolic dysfunction (impaired relaxation).  2. Right ventricular systolic function is normal. The right ventricular size is normal. Tricuspid regurgitation signal is inadequate for assessing PA pressure.  3. Left atrial size was mildly dilated.  4. The mitral  valve is normal in structure. No evidence of mitral valve regurgitation. No evidence of mitral stenosis.  5. The aortic valve is normal in structure. Aortic valve regurgitation is not visualized. No aortic stenosis is present.  6. The inferior vena cava is normal in size with greater than 50% respiratory variability, suggesting right atrial pressure of 3 mmHg. FINDINGS  Left Ventricle: Left ventricular ejection fraction, by estimation, is 60 to 65%. The left ventricle has normal function. The left ventricle has no regional wall motion abnormalities. The left ventricular internal cavity size was normal in size. There is  severe concentric left ventricular hypertrophy. Left ventricular diastolic parameters are consistent with Grade I diastolic dysfunction (impaired relaxation). Right Ventricle: The right ventricular size is normal. No increase in right ventricular wall thickness. Right ventricular systolic function is normal. Tricuspid regurgitation signal is inadequate for assessing PA pressure. Left Atrium: Left atrial size was mildly dilated. Right Atrium: Right atrial size was normal in size. Pericardium: There is no evidence of pericardial effusion. Mitral Valve: The mitral valve is normal in structure. No evidence of mitral valve regurgitation. No evidence of mitral valve stenosis. Tricuspid Valve: The tricuspid valve is normal in structure. Tricuspid valve regurgitation is trivial. No evidence of tricuspid stenosis. Aortic Valve: The aortic valve is normal in structure. Aortic valve regurgitation is not visualized. No aortic stenosis is present. Aortic valve mean gradient measures 6.0 mmHg. Aortic valve peak gradient measures 10.6 mmHg. Aortic valve area, by VTI measures 2.91 cm. Pulmonic Valve: The pulmonic valve was normal in structure. Pulmonic valve regurgitation is not visualized. No evidence of pulmonic stenosis. Aorta: The aortic root is normal in size and structure. Venous: The inferior vena cava is  normal in size with greater than 50% respiratory variability, suggesting right atrial pressure of 3 mmHg. IAS/Shunts: No atrial level shunt detected by color flow Doppler.  LEFT VENTRICLE PLAX 2D LVIDd:         4.70 cm   Diastology LVIDs:         2.91 cm   LV e' medial:    8.59 cm/s LV PW:  1.93 cm   LV E/e' medial:  9.9 LV IVS:        1.63 cm   LV e' lateral:   7.62 cm/s LVOT diam:     2.00 cm   LV E/e' lateral: 11.2 LV SV:         70 LV SV Index:   31 LVOT Area:     3.14 cm  RIGHT VENTRICLE RV Basal diam:  3.25 cm RV S prime:     21.90 cm/s LEFT ATRIUM             Index        RIGHT ATRIUM           Index LA diam:        4.00 cm 1.77 cm/m   RA Area:     15.40 cm LA Vol (A2C):   67.8 ml 30.08 ml/m  RA Volume:   37.20 ml  16.50 ml/m LA Vol (A4C):   50.6 ml 22.45 ml/m LA Biplane Vol: 59.4 ml 26.35 ml/m  AORTIC VALVE AV Area (Vmax):    2.70 cm AV Area (Vmean):   2.55 cm AV Area (VTI):     2.91 cm AV Vmax:           163.00 cm/s AV Vmean:          117.000 cm/s AV VTI:            0.241 m AV Peak Grad:      10.6 mmHg AV Mean Grad:      6.0 mmHg LVOT Vmax:         140.00 cm/s LVOT Vmean:        94.900 cm/s LVOT VTI:          0.223 m LVOT/AV VTI ratio: 0.93  AORTA Ao Root diam: 3.10 cm MITRAL VALVE MV Area (PHT): 3.20 cm     SHUNTS MV Decel Time: 237 msec     Systemic VTI:  0.22 m MV E velocity: 85.10 cm/s   Systemic Diam: 2.00 cm MV A velocity: 119.00 cm/s MV E/A ratio:  0.72 Kathlyn Sacramento MD Electronically signed by Kathlyn Sacramento MD Signature Date/Time: 03/06/2022/4:49:59 PM    Final    US Renal  Result Date: 03/05/2022 CLINICAL DATA:  New onset AKI with fever EXAM: RENAL / URINARY TRACT ULTRASOUND COMPLETE COMPARISON:  None Available. FINDINGS: Right Kidney: Renal measurements: 8.7 x 5.2 x 4.3 cm = volume: 103 mL. Echogenicity increased. A 1 x 1.2 x 1.1 cm cystic lesion likely representing a simple renal cyst. Simple renal cysts, in the absence of clinically indicated signs/symptoms, require no  independent follow-up. No solid mass or hydronephrosis visualized. Left Kidney: Renal measurements: 9.1 x 4.9 x 4.7 cm = volume: 109 mL. Echogenicity increased. No mass or hydronephrosis visualized. Urinary bladder: Not visualized-empty. Other: None. IMPRESSION: Increased renal parenchymal echogenicity suggestive of renal parenchymal disease. Electronically Signed   By: Iven Finn M.D.   On: 03/05/2022 15:20     Medications:     aspirin EC  81 mg Oral Daily   azithromycin  250 mg Oral Daily   calcium carbonate  200 mg of elemental calcium Oral BID   furosemide  20 mg Intravenous BID   heparin  5,000 Units Subcutaneous Q8H   hydrALAZINE  100 mg Oral Q8H   isosorbide mononitrate  30 mg Oral Daily   multivitamin with minerals  1 tablet Oral BID   acetaminophen, albuterol, dextromethorphan-guaiFENesin, hydrALAZINE, nitroGLYCERIN, ondansetron (ZOFRAN) IV  Assessment/ Plan:  56 y.o. male with past medical history of diabetes mellitus type 2, hypertension, obesity, obstructive sleep apnea on CPAP, chronic kidney disease stage II baseline EGFR 61 who was admitted to [facility] with hypertensive emergency.  1.  Acute kidney injury with hyperkalemia/microscopichematuria, nephrotic range proteinuria with chronic kidney disease stage II baseline EGFR of 61.  Acute kidney injury appears to be multifactorial however of concern the patient has hematuria and proteinuria.  Malignant hypertension could potentially account for these findings however search for underlying glomerulonephritis has been undertaken as well.  We talked about the potential for renal biopsy but we will need to control blood pressure well prior to pursuing this.  Patient verbalized understanding.  Awaiting further serologic work-up however urine protein creatinine ratio was 5 and ANA was negative.  Awaiting additional serologic work-up.  2.  Hypertensive emergency.  Blood pressure was 259/122 on admission.  Was  on amlodipine, not on it at the moment, unclear why this was stopped.  Currently on hydralazine.  Add carvedilol 6.72m BID, consider adding back amlodipine but will await discussion with Dr. KMaryland Pink    LOS: 1 Venida Tsukamoto 7/4/20232:58 PM

## 2022-03-07 NOTE — Progress Notes (Signed)
[facility] Progress Note  Patient: Devon Rios    PYP:[id number]  DOA: 03/05/2022    Date of Service: the patient was seen and examined on 03/07/2022  Brief hospital course: 56 year old male with past medical history of morbid obesity, hypertension, diabetes mellitus, obstructive sleep apnea and stage III chronic kidney disease presented to the emergency room with cough, shortness of breath and subjective fever.  In the emergency room, patient found to have markedly elevated blood pressure with initial blood pressure of 259/122, acute kidney injury with creatinine 3.88 and GFR 17 and elevated procalcitonin level of 0.9.  Patient had been without his blood pressure medicines for several weeks and these were restarted.  Started on some Lasix and echocardiogram only noted mild diastolic dysfunction.  Blood pressure remains difficult to control.  Creatinine which had improved on 7/3 trended back up to 3.82 on 7/4.  Nephrology recommending renal biopsy.  Assessment and Plan: Assessment and Plan: * Hypertensive urgency Patient has been without his blood pressure medicines x2 weeks.  His PCP had retired and he had not yet established with a new one.  CHF likely playing only mild factor in this.  Increasing Imdur and Coreg.  Restarting Norvasc.  Acute renal failure superimposed on stage 3a chronic kidney disease ([place]) Renal ultrasound is negative for obstruction. Held Cozaar and Voltaren gel.  Patient has never had a nephrologist before.  Last creatinine on record at 1.45 two years ago.  On arrival at 3.88.  Initially improved, but has since trended back up 2 days later.  Discussed with nephrology and could be secondary to renal disease, however needs renal biopsy to rule out other etiology.  Acute diastolic (congestive) heart failure (HCC) BNP mildly elevated.  Echocardiogram notes grade 1 diastolic dysfunction.  Have started some education about weighing himself daily, taking medications and over  time, checking sodium content.  Despite Lasix, only diuresed less than 1 L.  Now appears euvolemic, have stopped Lasix to limit worsening renal function  Elevated troponin Elevated troponins secondary to CHF.  No evidence of ACS.  Prediabetes Diet-controlled diabetes.  A1c at 6.0.  Hypokalemia Replacing as needed.  Magnesium level normal.  Bronchitis Etiology is not clear.  Noted subjective fever x1 week.  Cough only started 2 days ago which may be more likely due to the heart failure.  Procalcitonin on admission at 0.91, so have started Zithromax.  OSA on CPAP Patient remains compliant with his CPAP, he did bring his machine here  Obesity, Class III, BMI 40-49.9 (morbid obesity) (HCC) Meets criteria for BMI greater than 40        Body mass index is 42.77 kg/m.        Consultants: Nephrology  Procedures: Echocardiogram noting grade 1 diastolic dysfunction  Antimicrobials: Zithromax 7/3-present  Code Status: Full code   Subjective: Feels somewhat better.  Slightly lightheaded as blood pressures have improved  Objective: Blood pressure still significantly elevated although improved from previous day Vitals:   03/07/22 1150 03/07/22 1455  BP: (!) 164/84 (!) 165/78  Pulse: 94 (!) 107  Resp: 18 16  Temp: 97.7 F (36.5 C) 97.9 F (36.6 C)  SpO2: 98% 98%    Intake/Output Summary (Last 24 hours) at 03/07/2022 1907 Last data filed at 03/07/2022 1600 Gross per 24 hour  Intake 280 ml  Output 950 ml  Net -670 ml    Filed Weights   03/05/22 1046  Weight: 120.2 kg   Body mass index is 42.77 kg/m.  Exam:  General: Alert and oriented x3, no acute distress HEENT: Normocephalic atraumatic, mucous membranes are moist, narrow airway Cardiovascular: Regular rate and rhythm, S1-S2 Respiratory: Clear to auscultation bilaterally Abdomen: Soft, nontender, nondistended with positive bowel sounds Musculoskeletal: Clubbing or cyanosis or edema Skin: No skin break,  tears or lesions Psychiatry: Appropriate, no evidence of psychoses Neurology: No focal deficits  Data Reviewed: Creatinine up to 3.82  Disposition:  Status is: Inpatient Remains inpatient appropriate because: Improvement in blood pressure and renal biopsy  Anticipated discharge date: 7/5  Family Communication: Wife at the bedside DVT Prophylaxis: heparin injection 5,000 Units Start: 03/05/22 2200    Author: Annita Brod ,MD 03/07/2022 7:07 PM  To reach On-call, see care teams to locate the attending and reach out via [email or web address]. Between 7PM-7AM, please contact night-coverage If you still have difficulty reaching the attending provider, please page the [facility] (Director on Call) for [facility] on amion for assistance.

## 2022-03-08 ENCOUNTER — Other Ambulatory Visit: Payer: Self-pay | Admitting: Internal Medicine

## 2022-03-08 DIAGNOSIS — I160 Hypertensive urgency: Secondary | ICD-10-CM | POA: Diagnosis not present

## 2022-03-08 DIAGNOSIS — N179 Acute kidney failure, unspecified: Secondary | ICD-10-CM | POA: Diagnosis not present

## 2022-03-08 DIAGNOSIS — R778 Other specified abnormalities of plasma proteins: Secondary | ICD-10-CM

## 2022-03-08 DIAGNOSIS — I5031 Acute diastolic (congestive) heart failure: Secondary | ICD-10-CM | POA: Diagnosis not present

## 2022-03-08 LAB — BASIC METABOLIC PANEL
Anion gap: 8 (ref 5–15)
BUN: 41 mg/dL — ABNORMAL HIGH (ref 6–20)
CO2: 24 mmol/L (ref 22–32)
Calcium: 9.3 mg/dL (ref 8.9–10.3)
Chloride: 104 mmol/L (ref 98–111)
Creatinine, Ser: 4.28 mg/dL — ABNORMAL HIGH (ref 0.61–1.24)
GFR, Estimated: 16 mL/min — ABNORMAL LOW (ref >60)
Glucose, Bld: 113 mg/dL — ABNORMAL HIGH (ref 70–99)
Potassium: 3.6 mmol/L (ref 3.5–5.1)
Sodium: 136 mmol/L (ref 135–145)

## 2022-03-08 LAB — GLUCOSE, CAPILLARY: Glucose-Capillary: 120 mg/dL — ABNORMAL HIGH (ref 70–99)

## 2022-03-08 LAB — IMMUNOFIXATION, URINE

## 2022-03-08 LAB — GLOMERULAR BASEMENT MEMBRANE ANTIBODIES: GBM Ab: <0.2 units (ref 0.0–0.9)

## 2022-03-08 MED ORDER — ISOSORBIDE MONONITRATE ER 60 MG PO TB24: 60.0000 mg | ORAL_TABLET | Freq: Every day | ORAL | 1 refills | Status: AC

## 2022-03-08 MED ORDER — SODIUM CHLORIDE 0.9 % IV SOLN: INTRAVENOUS | Status: DC

## 2022-03-08 MED ORDER — HYDRALAZINE HCL 100 MG PO TABS: 100.0000 mg | ORAL_TABLET | Freq: Three times a day (TID) | ORAL | 1 refills | Status: AC

## 2022-03-08 MED ORDER — CARVEDILOL 25 MG PO TABS: 25.0000 mg | ORAL_TABLET | Freq: Two times a day (BID) | ORAL | 1 refills | Status: AC

## 2022-03-08 MED ORDER — POLYETHYLENE GLYCOL 3350 17 G PO PACK
17.0000 g | PACK | Freq: Every day | ORAL | Status: DC
  Administered 2022-03-08: 17 g via ORAL
  Filled 2022-03-08: qty 1

## 2022-03-08 MED ORDER — AMLODIPINE BESYLATE 10 MG PO TABS: 10.0000 mg | ORAL_TABLET | Freq: Every day | ORAL | 1 refills | Status: AC

## 2022-03-08 NOTE — Progress Notes (Signed)
[facility]  ROUNDING NOTE   Subjective:   Patient seen sitting in chair Completed breakfast tray at bedside Patient appears frustrated with care what appears to be delays with treatments. Spent some time discussing this with the patient and how some treatments take priority over others at that time.  Explained that his case is multifactorial and must be handled as such.  950 mL urine output recorded on day shift yesterday Creatinine increased to 4.28 from 3.82 yesterday   Objective:  Vital signs in last 24 hours:  Temp:  [97.9 F (36.6 C)-98.8 F (37.1 C)] 98.2 F (36.8 C) (07/05 1146) Pulse Rate:  [74-107] 74 (07/05 1146) Resp:  [14-18] 18 (07/05 1146) BP: (101-165)/(61-78) 120/72 (07/05 1146) SpO2:  [93 %-100 %] 95 % (07/05 1146)  Weight change:  Filed Weights   03/05/22 1046  Weight: 120.2 kg    Intake/Output: I/O last 3 completed shifts: In: 280 [P.O.:280] Out: 950 [Urine:950]   Intake/Output this shift:  No intake/output data recorded.  Physical Exam: General: No acute distress  Head: Normocephalic, atraumatic. Moist oral mucosal membranes  Lungs:  Clear to auscultation, normal effort  Heart: S1S2 no rubs  Abdomen:  Soft, nontender, bowel sounds present  Extremities: Trace peripheral edema.  Neurologic: Awake, alert, following commands  Skin: No acute rash  Access: None    Basic Metabolic Panel: Recent Labs  Lab 03/05/22 1102 03/05/22 1251 03/06/22 0453 03/07/22 0606 03/08/22 0631  NA 140  --  141 138 136  K 3.3*  --  3.4* 3.5 3.6  CL 107  --  110 105 104  CO2 22  --  '22 23 24  ' GLUCOSE 100*  --  99 123* 113*  BUN 38*  --  36* 36* 41*  CREATININE 3.88*  --  3.66* 3.82* 4.28*  CALCIUM 8.8*  --  8.7* 9.2 9.3  MG  --  2.3  --   --   --      Liver Function Tests: Recent Labs  Lab 03/05/22 1102  AST 31  ALT 24  ALKPHOS 73  BILITOT 0.8  PROT 7.8  ALBUMIN 3.5    No results for input(s): "LIPASE", "AMYLASE" in the last 168  hours. No results for input(s): "AMMONIA" in the last 168 hours.  CBC: Recent Labs  Lab 03/05/22 1102 03/07/22 0606  WBC 6.1 7.1  NEUTROABS 4.1  --   HGB 12.6* 12.7*  HCT 38.6* 38.8*  MCV 86.0 84.5  PLT 183 242     Cardiac Enzymes: No results for input(s): "CKTOTAL", "CKMB", "CKMBINDEX", "TROPONINI" in the last 168 hours.  BNP: Invalid input(s): "POCBNP"  CBG: Recent Labs  Lab 03/06/22 1223 03/07/22 0728 03/07/22 1149 03/07/22 1649 03/08/22 0747  GLUCAP 132* 127* 134* 116* 120*     Microbiology: Results for orders placed or performed during the hospital encounter of 03/05/22  Resp Panel by RT-PCR (Flu A&B, Covid) Anterior Nasal Swab     Status: None   Collection Time: 03/05/22 11:02 AM   Specimen: Anterior Nasal Swab  Result Value Ref Range Status   SARS Coronavirus 2 by RT PCR NEGATIVE NEGATIVE Final    Comment: (NOTE) SARS-CoV-2 target nucleic acids are NOT DETECTED.  The SARS-CoV-2 RNA is generally detectable in upper respiratory specimens during the acute phase of infection. The lowest concentration of SARS-CoV-2 viral copies this assay can detect is 138 copies/mL. A negative result does not preclude SARS-Cov-2 infection and should not be used as the sole basis for  treatment or other patient management decisions. A negative result may occur with  improper specimen collection/handling, submission of specimen other than nasopharyngeal swab, presence of viral mutation(s) within the areas targeted by this assay, and inadequate number of viral copies(<138 copies/mL). A negative result must be combined with clinical observations, patient history, and epidemiological information. The expected result is Negative.  Fact Sheet for Patients:  EntrepreneurPulse.com.au  Fact Sheet for Healthcare Providers:  IncredibleEmployment.be  This test is no t yet approved or cleared by the [place] FDA and  has been authorized for  detection and/or diagnosis of SARS-CoV-2 by FDA under an Emergency Use Authorization (EUA). This EUA will remain  in effect (meaning this test can be used) for the duration of the COVID-19 declaration under Section 564(b)(1) of the Act, 21 U.S.C.section 360bbb-3(b)(1), unless the authorization is terminated  or revoked sooner.       Influenza A by PCR NEGATIVE NEGATIVE Final   Influenza B by PCR NEGATIVE NEGATIVE Final    Comment: (NOTE) The Xpert Xpress SARS-CoV-2/FLU/RSV plus assay is intended as an aid in the diagnosis of influenza from Nasopharyngeal swab specimens and should not be used as a sole basis for treatment. Nasal washings and aspirates are unacceptable for Xpert Xpress SARS-CoV-2/FLU/RSV testing.  Fact Sheet for Patients: EntrepreneurPulse.com.au  Fact Sheet for Healthcare Providers: IncredibleEmployment.be  This test is not yet approved or cleared by the [place] FDA and has been authorized for detection and/or diagnosis of SARS-CoV-2 by FDA under an Emergency Use Authorization (EUA). This EUA will remain in effect (meaning this test can be used) for the duration of the COVID-19 declaration under Section 564(b)(1) of the Act, 21 U.S.C. section 360bbb-3(b)(1), unless the authorization is terminated or revoked.  Performed at [facility], [address] [place], [place] [address]   Culture, blood (routine x 2)     Status: None (Preliminary result)   Collection Time: 03/05/22 11:02 AM   Specimen: BLOOD  Result Value Ref Range Status   Specimen Description BLOOD RIGHT ANTECUBITAL  Final   Special Requests   Final    BOTTLES DRAWN AEROBIC AND ANAEROBIC Blood Culture adequate volume   Culture   Final    NO GROWTH 3 DAYS Performed at [facility], [address] [place], [place] [address]    Report Status PENDING  Incomplete  Culture, blood (Routine X 2) w Reflex to ID Panel     Status: None  (Preliminary result)   Collection Time: 03/05/22  9:39 PM   Specimen: BLOOD  Result Value Ref Range Status   Specimen Description BLOOD LEFT [facility]  Final   Special Requests   Final    BOTTLES DRAWN AEROBIC AND ANAEROBIC Blood Culture adequate volume   Culture   Final    NO GROWTH 3 DAYS Performed at [facility], [address], [place], [place] [address]    Report Status PENDING  Incomplete    Coagulation Studies: Recent Labs    03/05/22 1932  LABPROT 15.1  INR 1.2     Urinalysis: No results for input(s): "COLORURINE", "LABSPEC", "PHURINE", "GLUCOSEU", "HGBUR", "BILIRUBINUR", "KETONESUR", "PROTEINUR", "UROBILINOGEN", "NITRITE", "LEUKOCYTESUR" in the last 72 hours.  Invalid input(s): "APPERANCEUR"     Imaging: ECHOCARDIOGRAM COMPLETE  Result Date: 03/06/2022    ECHOCARDIOGRAM REPORT   Patient Name:   Devon Rios Date of Exam: 03/06/2022 Medical Rec #:  [id number]   Height:       66.0 in Accession #:    [id number]  Weight:       265.0 lb Date of Birth:  [birth year]  BSA:          2.254 m Patient Age:    25 years    BP:           189/101 mmHg Patient Gender: M           HR:           95 bpm. Exam Location:  [facility] Procedure: 2D Echo, Cardiac Doppler and Color Doppler Indications:     Y04.59 CHF acute systolic  History:         Patient has no prior history of Echocardiogram examinations.                  Risk Factors:Hypertension, Diabetes and Sleep Apnea.  Sonographer:     Rosalia Hammers Referring Phys:  [place] Diagnosing Phys: Kathlyn Sacramento MD  Sonographer Comments: Suboptimal apical window. Image acquisition challenging due to patient body habitus. IMPRESSIONS  1. Left ventricular ejection fraction, by estimation, is 60 to 65%. The left ventricle has normal function. The left ventricle has no regional wall motion abnormalities. There is severe concentric left ventricular hypertrophy. Left ventricular diastolic  parameters are consistent with Grade I diastolic  dysfunction (impaired relaxation).  2. Right ventricular systolic function is normal. The right ventricular size is normal. Tricuspid regurgitation signal is inadequate for assessing PA pressure.  3. Left atrial size was mildly dilated.  4. The mitral valve is normal in structure. No evidence of mitral valve regurgitation. No evidence of mitral stenosis.  5. The aortic valve is normal in structure. Aortic valve regurgitation is not visualized. No aortic stenosis is present.  6. The inferior vena cava is normal in size with greater than 50% respiratory variability, suggesting right atrial pressure of 3 mmHg. FINDINGS  Left Ventricle: Left ventricular ejection fraction, by estimation, is 60 to 65%. The left ventricle has normal function. The left ventricle has no regional wall motion abnormalities. The left ventricular internal cavity size was normal in size. There is  severe concentric left ventricular hypertrophy. Left ventricular diastolic parameters are consistent with Grade I diastolic dysfunction (impaired relaxation). Right Ventricle: The right ventricular size is normal. No increase in right ventricular wall thickness. Right ventricular systolic function is normal. Tricuspid regurgitation signal is inadequate for assessing PA pressure. Left Atrium: Left atrial size was mildly dilated. Right Atrium: Right atrial size was normal in size. Pericardium: There is no evidence of pericardial effusion. Mitral Valve: The mitral valve is normal in structure. No evidence of mitral valve regurgitation. No evidence of mitral valve stenosis. Tricuspid Valve: The tricuspid valve is normal in structure. Tricuspid valve regurgitation is trivial. No evidence of tricuspid stenosis. Aortic Valve: The aortic valve is normal in structure. Aortic valve regurgitation is not visualized. No aortic stenosis is present. Aortic valve mean gradient measures 6.0 mmHg. Aortic valve peak gradient measures 10.6 mmHg. Aortic valve area, by VTI  measures 2.91 cm. Pulmonic Valve: The pulmonic valve was normal in structure. Pulmonic valve regurgitation is not visualized. No evidence of pulmonic stenosis. Aorta: The aortic root is normal in size and structure. Venous: The inferior vena cava is normal in size with greater than 50% respiratory variability, suggesting right atrial pressure of 3 mmHg. IAS/Shunts: No atrial level shunt detected by color flow Doppler.  LEFT VENTRICLE PLAX 2D LVIDd:         4.70 cm   Diastology LVIDs:  2.91 cm   LV e' medial:    8.59 cm/s LV PW:         1.93 cm   LV E/e' medial:  9.9 LV IVS:        1.63 cm   LV e' lateral:   7.62 cm/s LVOT diam:     2.00 cm   LV E/e' lateral: 11.2 LV SV:         70 LV SV Index:   31 LVOT Area:     3.14 cm  RIGHT VENTRICLE RV Basal diam:  3.25 cm RV S prime:     21.90 cm/s LEFT ATRIUM             Index        RIGHT ATRIUM           Index LA diam:        4.00 cm 1.77 cm/m   RA Area:     15.40 cm LA Vol (A2C):   67.8 ml 30.08 ml/m  RA Volume:   37.20 ml  16.50 ml/m LA Vol (A4C):   50.6 ml 22.45 ml/m LA Biplane Vol: 59.4 ml 26.35 ml/m  AORTIC VALVE AV Area (Vmax):    2.70 cm AV Area (Vmean):   2.55 cm AV Area (VTI):     2.91 cm AV Vmax:           163.00 cm/s AV Vmean:          117.000 cm/s AV VTI:            0.241 m AV Peak Grad:      10.6 mmHg AV Mean Grad:      6.0 mmHg LVOT Vmax:         140.00 cm/s LVOT Vmean:        94.900 cm/s LVOT VTI:          0.223 m LVOT/AV VTI ratio: 0.93  AORTA Ao Root diam: 3.10 cm MITRAL VALVE MV Area (PHT): 3.20 cm     SHUNTS MV Decel Time: 237 msec     Systemic VTI:  0.22 m MV E velocity: 85.10 cm/s   Systemic Diam: 2.00 cm MV A velocity: 119.00 cm/s MV E/A ratio:  0.72 Kathlyn Sacramento MD Electronically signed by Kathlyn Sacramento MD Signature Date/Time: 03/06/2022/4:49:59 PM    Final      Medications:    sodium chloride 75 mL/hr at 03/08/22 0901    amLODipine  10 mg Oral Daily   calcium carbonate  200 mg of elemental calcium Oral BID   carvedilol   25 mg Oral BID WC   heparin  5,000 Units Subcutaneous Q8H   hydrALAZINE  100 mg Oral Q8H   isosorbide mononitrate  60 mg Oral Daily   multivitamin with minerals  1 tablet Oral BID   polyethylene glycol  17 g Oral Daily   acetaminophen, albuterol, dextromethorphan-guaiFENesin, hydrALAZINE, nitroGLYCERIN, ondansetron (ZOFRAN) IV  Assessment/ Plan:  56 y.o. male with past medical history of diabetes mellitus type 2, hypertension, obesity, obstructive sleep apnea on CPAP, chronic kidney disease stage II baseline EGFR 61 who was admitted to [facility] with hypertensive emergency.  1.  Acute kidney injury with hyperkalemia/microscopichematuria, nephrotic range proteinuria with chronic kidney disease stage II baseline EGFR of 61.  Acute kidney injury appears to be multifactorial however of concern the patient has hematuria and proteinuria.  Malignant hypertension could potentially account for these findings however search for underlying glomerulonephritis has been undertaken as  well.   We would like to continue work-up with renal biopsy however aspirin use earlier during admission has delayed this procedure.  Patient must be aspirin free for 5 days before procedure can be conducted.  Patient continues to have adequate urine output, appropriate appetite, and no symptoms of uremia.  Patient is cleared to discharge from renal stance, with very close outpatient follow-up next week in office to monitor.  Will reach out to IR to schedule renal biopsy next week.  2.  Hypertensive emergency.  Blood pressure was 259/122 on admission.    Adequate blood pressure control achieved, 128/67, with addition of carvedilol and continuation of amlodipine yesterday.   LOS: 2 Faduma Cho 7/5/202312:50 PM

## 2022-03-08 NOTE — Discharge Summary (Addendum)
Physician Discharge Summary   Patient: Devon Rios MRN: [id number] DOB: 06-06-1966  Admit date:     03/05/2022  Discharge date: 03/08/22  Discharge Physician: Annita Brod   PCP: Jearld Fenton, NP   Recommendations at discharge:   Patient will have outpatient renal biopsy Patient will follow-up with nephrology next week Patient given information to see establish with new primary care physician New medication: Norvasc 10 p.o. daily New medication: Coreg 25 p.o. twice daily New medication: Hydralazine 100 mg every 8 hours New medication: Imdur 60 mg p.o. daily Medication change: Although patient had not been taking Cozaar for some time, this medication is being discontinued due to worsening kidney function Medication change: Voltaren gel discontinued Medication change: Aspirin discontinued.  Patient also advised to avoid all aspirin products including Motrin, ibuprofen, Aleve, Advil, Goody powders  Discharge Diagnoses: Active Problems:   Acute renal failure superimposed on stage 3a chronic kidney disease (HCC)   Acute diastolic (congestive) heart failure (HCC)   Elevated troponin   Prediabetes   Hypokalemia   Bronchitis   OSA on CPAP   Obesity, Class III, BMI 40-49.9 (morbid obesity) ([place])  Principal Problem (Resolved):   Hypertensive emergency  Hospital Course: 56 year old male with past medical history of morbid obesity, hypertension, diabetes mellitus, obstructive sleep apnea and stage III chronic kidney disease presented to the emergency room with cough, shortness of breath and subjective fever.  In the emergency room, patient found to have markedly elevated blood pressure with initial blood pressure of 259/122, acute kidney injury with creatinine 3.88 and GFR 17 and elevated procalcitonin level of 0.9.  Patient had been without his blood pressure medicines for several weeks and these were restarted.  Started on some Lasix and echocardiogram only noted mild diastolic  dysfunction.  Blood pressure remains difficult to control.  Creatinine which had improved on 7/3 trended back up to 3.82 on 7/4.  Nephrology recommending renal biopsy however patient unable to have this done as he has been on aspirin and will need to be off this medication for 5 days.  By 7/5 after adjustments of blood pressure medications, blood pressure much improved with systolic in the 409W to 119J.  Plan will be for patient to be discharged with outpatient renal biopsy.  Assessment and Plan: * Hypertensive emergency-resolved as of 03/08/2022 Patient has been without his blood pressure medicines x2 weeks.  His PCP had retired and he had not yet established with a new one.  CHF likely playing only mild factor in this.  Patient none now much improved blood pressure regimen of Norvasc, Coreg, hydralazine and Imdur and patient will stop Cozaar, although he had not been taking Cozaar for some time.  Acute renal failure superimposed on stage 3a chronic kidney disease ([place]) Renal ultrasound is negative for obstruction. Held Cozaar and Voltaren gel and these will be discontinued.  Patient will follow-up with nephrology as outpatient.  Patient will have outpatient renal biopsy.  Last creatinine on record at 1.45 two years ago.  On arrival at 3.88.  Initially improved, but since then, has trended upwards and on day of discharge at 4.2.  Acute changes during hospitalization may be due to Lasix and so Lasix was stopped and patient put on very gentle IV fluids.  However, clearly an underlying component and renal biopsy needed.  Patient does not have any history of CAD or stroke, so aspirin and all aspirin products discontinued.  Acute diastolic (congestive) heart failure (HCC) BNP mildly elevated.  Echocardiogram notes  grade 1 diastolic dysfunction.  Have started some education about weighing himself daily, taking medications and over time, checking sodium content.  Despite Lasix, only diuresed less than 1 L.  Given  increasing creatinine, he is likely over diuresed and so getting some gentle IV fluids on day of discharge.  Patient counseled about weighing himself daily.  Elevated troponin Elevated troponins secondary to CHF and hypertensive urgency.  No evidence of ACS.  Prediabetes Diet-controlled diabetes.  A1c at 6.0.  Hypokalemia Replacing as needed.  Magnesium level normal.  Bronchitis Etiology is not clear.  Noted subjective fever x1 week.  Cough only started 2 days ago which may be more likely due to the heart failure.  Procalcitonin on admission at 0.91, so have started Zithromax.  Patient completed full course prior to discharge.  OSA on CPAP Patient remains compliant with his CPAP, he did bring his machine here  Obesity, Class III, BMI 40-49.9 (morbid obesity) (HCC) Meets criteria for BMI greater than 40          Consultants: Nephrology Procedures performed: Echocardiogram noting grade 1 diastolic dysfunction Disposition: Home Diet recommendation:  Discharge Diet Orders (From admission, onward)     Start     Ordered   03/08/22 0000  Diet - low sodium heart healthy        03/08/22 1312           Cardiac diet DISCHARGE MEDICATION: Allergies as of 03/08/2022   No Known Allergies      Medication List     STOP taking these medications    losartan 50 MG tablet Commonly known as: COZAAR   Voltaren 1 % Gel Generic drug: diclofenac Sodium       TAKE these medications    amLODipine 10 MG tablet Commonly known as: NORVASC Take 1 tablet (10 mg total) by mouth daily. Start taking on: March 09, 2022   Bayer Contour Monitor w/Device Kit Check blood sugar twice a day and as directed Dx E11.9   Calcium 500 MG Chew Chew 1 each by mouth 2 (two) times daily.   carvedilol 25 MG tablet Commonly known as: COREG Take 1 tablet (25 mg total) by mouth 2 (two) times daily with a meal.   Celebrate Multi-Complete 45 Caps Take 1 each by mouth 2 (two) times daily.    hydrALAZINE 100 MG tablet Commonly known as: APRESOLINE Take 1 tablet (100 mg total) by mouth every 8 (eight) hours.   isosorbide mononitrate 60 MG 24 hr tablet Commonly known as: IMDUR Take 1 tablet (60 mg total) by mouth daily. Start taking on: March 09, 3733   OneTouch Delica Lancets 28J Misc 1 each by Does not apply route 2 (two) times daily. Dx E11.9   TYLENOL ARTHRITIS PAIN PO Take by mouth.        Follow-up Information     Lesleigh Noe, MD. Schedule an appointment as soon as possible for a visit in 1 month(s).   Specialty: Family Medicine Why: New primary care physician Contact information: [place] [address] [phone]         Anthonette Legato, MD Follow up in 1 week(s).   Specialty: Nephrology Why: Office will call you to set up appointment.  If you have not heard from them by end of day Thursday, call them on Friday Contact information: [place] [address] [phone]         [place]. Schedule an appointment as  soon as possible for a visit.   Why: Call to schedule kidney biopsy.  They already have the referral.  The earliest you can have this done would be Tuesday, 7/11. Contact information: [place]               Discharge Exam: Danley Danker Weights   03/05/22 1046  Weight: 120.2 kg   General: Alert and oriented x3, no acute distress Cardiovascular: Regular rate and rhythm, S1-S2 Lungs: Clear to auscultation bilaterally  Condition at discharge: good  The results of significant diagnostics from this hospitalization (including imaging, microbiology, ancillary and laboratory) are listed below for reference.   Imaging Studies: ECHOCARDIOGRAM COMPLETE  Result Date: 03/06/2022    ECHOCARDIOGRAM REPORT   Patient Name:   CHISTOPHER MANGINO Date of Exam: 03/06/2022 Medical Rec #:  [id number]   Height:       66.0 in Accession #:    [id number]  Weight:        265.0 lb Date of Birth:  [birth year]  BSA:          2.254 m Patient Age:    56 years    BP:           189/101 mmHg Patient Gender: M           HR:           95 bpm. Exam Location:  [facility] Procedure: 2D Echo, Cardiac Doppler and Color Doppler Indications:     H47.65 CHF acute systolic  History:         Patient has no prior history of Echocardiogram examinations.                  Risk Factors:Hypertension, Diabetes and Sleep Apnea.  Sonographer:     Rosalia Hammers Referring Phys:  [place] Diagnosing Phys: Kathlyn Sacramento MD  Sonographer Comments: Suboptimal apical window. Image acquisition challenging due to patient body habitus. IMPRESSIONS  1. Left ventricular ejection fraction, by estimation, is 60 to 65%. The left ventricle has normal function. The left ventricle has no regional wall motion abnormalities. There is severe concentric left ventricular hypertrophy. Left ventricular diastolic  parameters are consistent with Grade I diastolic dysfunction (impaired relaxation).  2. Right ventricular systolic function is normal. The right ventricular size is normal. Tricuspid regurgitation signal is inadequate for assessing PA pressure.  3. Left atrial size was mildly dilated.  4. The mitral valve is normal in structure. No evidence of mitral valve regurgitation. No evidence of mitral stenosis.  5. The aortic valve is normal in structure. Aortic valve regurgitation is not visualized. No aortic stenosis is present.  6. The inferior vena cava is normal in size with greater than 50% respiratory variability, suggesting right atrial pressure of 3 mmHg. FINDINGS  Left Ventricle: Left ventricular ejection fraction, by estimation, is 60 to 65%. The left ventricle has normal function. The left ventricle has no regional wall motion abnormalities. The left ventricular internal cavity size was normal in size. There is  severe concentric left ventricular hypertrophy. Left ventricular diastolic parameters are consistent with  Grade I diastolic dysfunction (impaired relaxation). Right Ventricle: The right ventricular size is normal. No increase in right ventricular wall thickness. Right ventricular systolic function is normal. Tricuspid regurgitation signal is inadequate for assessing PA pressure. Left Atrium: Left atrial size was mildly dilated. Right Atrium: Right atrial size was normal in size. Pericardium: There is no evidence of pericardial effusion. Mitral Valve: The mitral  valve is normal in structure. No evidence of mitral valve regurgitation. No evidence of mitral valve stenosis. Tricuspid Valve: The tricuspid valve is normal in structure. Tricuspid valve regurgitation is trivial. No evidence of tricuspid stenosis. Aortic Valve: The aortic valve is normal in structure. Aortic valve regurgitation is not visualized. No aortic stenosis is present. Aortic valve mean gradient measures 6.0 mmHg. Aortic valve peak gradient measures 10.6 mmHg. Aortic valve area, by VTI measures 2.91 cm. Pulmonic Valve: The pulmonic valve was normal in structure. Pulmonic valve regurgitation is not visualized. No evidence of pulmonic stenosis. Aorta: The aortic root is normal in size and structure. Venous: The inferior vena cava is normal in size with greater than 50% respiratory variability, suggesting right atrial pressure of 3 mmHg. IAS/Shunts: No atrial level shunt detected by color flow Doppler.  LEFT VENTRICLE PLAX 2D LVIDd:         4.70 cm   Diastology LVIDs:         2.91 cm   LV e' medial:    8.59 cm/s LV PW:         1.93 cm   LV E/e' medial:  9.9 LV IVS:        1.63 cm   LV e' lateral:   7.62 cm/s LVOT diam:     2.00 cm   LV E/e' lateral: 11.2 LV SV:         70 LV SV Index:   31 LVOT Area:     3.14 cm  RIGHT VENTRICLE RV Basal diam:  3.25 cm RV S prime:     21.90 cm/s LEFT ATRIUM             Index        RIGHT ATRIUM           Index LA diam:        4.00 cm 1.77 cm/m   RA Area:     15.40 cm LA Vol (A2C):   67.8 ml 30.08 ml/m  RA Volume:    37.20 ml  16.50 ml/m LA Vol (A4C):   50.6 ml 22.45 ml/m LA Biplane Vol: 59.4 ml 26.35 ml/m  AORTIC VALVE AV Area (Vmax):    2.70 cm AV Area (Vmean):   2.55 cm AV Area (VTI):     2.91 cm AV Vmax:           163.00 cm/s AV Vmean:          117.000 cm/s AV VTI:            0.241 m AV Peak Grad:      10.6 mmHg AV Mean Grad:      6.0 mmHg LVOT Vmax:         140.00 cm/s LVOT Vmean:        94.900 cm/s LVOT VTI:          0.223 m LVOT/AV VTI ratio: 0.93  AORTA Ao Root diam: 3.10 cm MITRAL VALVE MV Area (PHT): 3.20 cm     SHUNTS MV Decel Time: 237 msec     Systemic VTI:  0.22 m MV E velocity: 85.10 cm/s   Systemic Diam: 2.00 cm MV A velocity: 119.00 cm/s MV E/A ratio:  0.72 Kathlyn Sacramento MD Electronically signed by Kathlyn Sacramento MD Signature Date/Time: 03/06/2022/4:49:59 PM    Final    US Renal  Result Date: 03/05/2022 CLINICAL DATA:  New onset AKI with fever EXAM: RENAL / URINARY TRACT ULTRASOUND COMPLETE COMPARISON:  None Available. FINDINGS: Right  Kidney: Renal measurements: 8.7 x 5.2 x 4.3 cm = volume: 103 mL. Echogenicity increased. A 1 x 1.2 x 1.1 cm cystic lesion likely representing a simple renal cyst. Simple renal cysts, in the absence of clinically indicated signs/symptoms, require no independent follow-up. No solid mass or hydronephrosis visualized. Left Kidney: Renal measurements: 9.1 x 4.9 x 4.7 cm = volume: 109 mL. Echogenicity increased. No mass or hydronephrosis visualized. Urinary bladder: Not visualized-empty. Other: None. IMPRESSION: Increased renal parenchymal echogenicity suggestive of renal parenchymal disease. Electronically Signed   By: Iven Finn M.D.   On: 03/05/2022 15:20   DG Chest Portable 1 View  Result Date: 03/05/2022 CLINICAL DATA:  Fever, chills, shortness of breath, weakness EXAM: PORTABLE CHEST 1 VIEW COMPARISON:  Portable exam 1101 hours compared to 11/08/2016 FINDINGS: Enlargement of cardiac silhouette. Mediastinal contours and pulmonary vascularity normal. Minimal RIGHT  basilar atelectasis. Lungs otherwise clear. No infiltrate, pleural effusion, or pneumothorax. IMPRESSION: Enlargement of cardiac silhouette with minimal RIGHT basilar atelectasis. Electronically Signed   By: Lavonia Dana M.D.   On: 03/05/2022 11:18    Microbiology: Results for orders placed or performed during the hospital encounter of 03/05/22  Resp Panel by RT-PCR (Flu A&B, Covid) Anterior Nasal Swab     Status: None   Collection Time: 03/05/22 11:02 AM   Specimen: Anterior Nasal Swab  Result Value Ref Range Status   SARS Coronavirus 2 by RT PCR NEGATIVE NEGATIVE Final    Comment: (NOTE) SARS-CoV-2 target nucleic acids are NOT DETECTED.  The SARS-CoV-2 RNA is generally detectable in upper respiratory specimens during the acute phase of infection. The lowest concentration of SARS-CoV-2 viral copies this assay can detect is 138 copies/mL. A negative result does not preclude SARS-Cov-2 infection and should not be used as the sole basis for treatment or other patient management decisions. A negative result may occur with  improper specimen collection/handling, submission of specimen other than nasopharyngeal swab, presence of viral mutation(s) within the areas targeted by this assay, and inadequate number of viral copies(<138 copies/mL). A negative result must be combined with clinical observations, patient history, and epidemiological information. The expected result is Negative.  Fact Sheet for Patients:  EntrepreneurPulse.com.au  Fact Sheet for Healthcare Providers:  IncredibleEmployment.be  This test is no t yet approved or cleared by the [place] FDA and  has been authorized for detection and/or diagnosis of SARS-CoV-2 by FDA under an Emergency Use Authorization (EUA). This EUA will remain  in effect (meaning this test can be used) for the duration of the COVID-19 declaration under Section 564(b)(1) of the Act, 21 U.S.C.section  360bbb-3(b)(1), unless the authorization is terminated  or revoked sooner.       Influenza A by PCR NEGATIVE NEGATIVE Final   Influenza B by PCR NEGATIVE NEGATIVE Final    Comment: (NOTE) The Xpert Xpress SARS-CoV-2/FLU/RSV plus assay is intended as an aid in the diagnosis of influenza from Nasopharyngeal swab specimens and should not be used as a sole basis for treatment. Nasal washings and aspirates are unacceptable for Xpert Xpress SARS-CoV-2/FLU/RSV testing.  Fact Sheet for Patients: EntrepreneurPulse.com.au  Fact Sheet for Healthcare Providers: IncredibleEmployment.be  This test is not yet approved or cleared by the [place] FDA and has been authorized for detection and/or diagnosis of SARS-CoV-2 by FDA under an Emergency Use Authorization (EUA). This EUA will remain in effect (meaning this test can be used) for the duration of the COVID-19 declaration under Section 564(b)(1) of the Act, 21 U.S.C. section 360bbb-3(b)(1), unless  the authorization is terminated or revoked.  Performed at [facility], [address] [place], [place] [address]   Culture, blood (routine x 2)     Status: None (Preliminary result)   Collection Time: 03/05/22 11:02 AM   Specimen: BLOOD  Result Value Ref Range Status   Specimen Description BLOOD RIGHT ANTECUBITAL  Final   Special Requests   Final    BOTTLES DRAWN AEROBIC AND ANAEROBIC Blood Culture adequate volume   Culture   Final    NO GROWTH 3 DAYS Performed at [facility], [address] [place], [place] [address]    Report Status PENDING  Incomplete  Culture, blood (Routine X 2) w Reflex to ID Panel     Status: None (Preliminary result)   Collection Time: 03/05/22  9:39 PM   Specimen: BLOOD  Result Value Ref Range Status   Specimen Description BLOOD LEFT AC  Final   Special Requests   Final    BOTTLES DRAWN AEROBIC AND ANAEROBIC Blood Culture adequate volume   Culture    Final    NO GROWTH 3 DAYS Performed at [facility], [address] [place], [place] [address]    Report Status PENDING  Incomplete    Labs: CBC: Recent Labs  Lab 03/05/22 1102 03/07/22 0606  WBC 6.1 7.1  NEUTROABS 4.1  --   HGB 12.6* 12.7*  HCT 38.6* 38.8*  MCV 86.0 84.5  PLT 183 258   Basic Metabolic Panel: Recent Labs  Lab 03/05/22 1102 03/05/22 1251 03/06/22 0453 03/07/22 0606 03/08/22 0631  NA 140  --  141 138 136  K 3.3*  --  3.4* 3.5 3.6  CL 107  --  110 105 104  CO2 22  --  _0 GLUCOSE 100*  --  99 123* 113*  BUN 38*  --  36* 36* 41*  CREATININE 3.88*  --  3.66* 3.82* 4.28*  CALCIUM 8.8*  --  8.7* 9.2 9.3  MG  --  2.3  --   --   --    Liver Function Tests: Recent Labs  Lab 03/05/22 1102  AST 31  ALT 24  ALKPHOS 73  BILITOT 0.8  PROT 7.8  ALBUMIN 3.5   CBG: Recent Labs  Lab 03/06/22 1223 03/07/22 0728 03/07/22 1149 03/07/22 1649 03/08/22 0747  GLUCAP 132* 127* 134* 116* 120*    Discharge time spent: greater than 30 minutes.  Signed: Annita Brod, MD [facility] 03/08/2022

## 2022-03-08 NOTE — Discharge Instructions (Signed)
Avoid aspirin and not aspirin related products including: Motrin, Aleve, ibuprofen, Advil, Goody powders, Voltaren gel Tylenol/acetaminophen okay for pain.   Weigh yourself first thing every morning before shower or breakfast.  Record your weight daily.  Contact your nephrologist or primary care doctor if your weight has increased by more than 3 pounds in 1 day or 5 pounds over 2 days.

## 2022-03-08 NOTE — Progress Notes (Signed)
IR received request for non target renal biopsy, patient is on Aspirin and will need to be off aspirin for 5 days prior to biopsy, this was discussed with my attending, Dr. Denna Haggard who agrees with the above plan. This procedure is likely to be done as an outpatient next week and ordering team is aware to order biopsy as an outpatient. Inpatient order has been cancelled.   Tsosie Billing D, PA-C 03/08/2022, 9:08 AM

## 2022-03-09 ENCOUNTER — Telehealth: Payer: Self-pay | Admitting: *Deleted

## 2022-03-09 LAB — BLOOD GAS, VENOUS
Acid-base deficit: 0.2 mmol/L (ref 0.0–2.0)
Bicarbonate: 24.8 mmol/L (ref 20.0–28.0)
O2 Saturation: 52.9 %
Patient temperature: 37
pCO2, Ven: 41 mmHg — ABNORMAL LOW (ref 44–60)
pH, Ven: 7.39 (ref 7.25–7.43)

## 2022-03-09 LAB — ANCA PROFILE
Anti-MPO Antibodies: <0.2 units (ref 0.0–0.9)
Anti-PR3 Antibodies: <0.2 units (ref 0.0–0.9)
Atypical P-ANCA titer: <1:20 {titer}
C-ANCA: <1:20 {titer}
P-ANCA: <1:20 {titer}

## 2022-03-09 LAB — PROTEIN ELECTROPHORESIS, SERUM
A/G Ratio: 0.8 (ref 0.7–1.7)
Albumin ELP: 3.4 g/dL (ref 2.9–4.4)
Alpha-1-Globulin: 0.4 g/dL (ref 0.0–0.4)
Alpha-2-Globulin: 1.1 g/dL — ABNORMAL HIGH (ref 0.4–1.0)
Beta Globulin: 1.3 g/dL (ref 0.7–1.3)
Gamma Globulin: 1.3 g/dL (ref 0.4–1.8)
Globulin, Total: 4.1 g/dL — ABNORMAL HIGH (ref 2.2–3.9)
Total Protein ELP: 7.5 g/dL (ref 6.0–8.5)

## 2022-03-09 NOTE — Telephone Encounter (Signed)
Transition Care Management Follow-up Telephone Call Date of discharge and from where: 03/08/22 [facility] How have you been since you were released from the hospital? [place], [place] Any questions or concerns? No  Items Reviewed: Did the pt receive and understand the discharge instructions provided? Yes  Medications obtained and verified? Yes  Other? No  Any new allergies since your discharge? No  Dietary orders reviewed? Yes Do you have support at home? Yes   Home Care and Equipment/Supplies: Were home health services ordered? no If so, what is the name of the agency? NA  Has the agency set up a time to come to the patient's home? not applicable Were any new equipment or medical supplies ordered?  No What is the name of the medical supply agency? NA Were you able to get the supplies/equipment? not applicable Do you have any questions related to the use of the equipment or supplies? No  Functional Questionnaire: (I = Independent and D = Dependent) ADLs: I  Bathing/Dressing- I  Meal Prep- I  Eating- I  Maintaining continence- I  Transferring/Ambulation- I  Managing Meds- I  Follow up appointments reviewed:  PCP Hospital f/u appt confirmed? No  NEEDS TO ESTABLISH PCP, RNCM OFFERED TO ASSIST MAKING APPOINTMENT WITH DR. Einar Pheasant, PATIENT DECLINED AT [facility] f/u appt confirmed? No  Scheduled to see DR. MOONSUR--NEEDS TO CALL AND SCHEDULE STATES HE WILL DO SO IN THE MORNING Are transportation arrangements needed? No  If their condition worsens, is the pt aware to call PCP or go to the Emergency Dept.? Yes Was the patient provided with contact information for the PCP's office or ED? Yes Was to pt encouraged to call back with questions or concerns? Yes    Hubert Azure RN, MSN RN Care Management Coordinator  [phone] [email or web address]

## 2022-03-10 LAB — CULTURE, BLOOD (ROUTINE X 2)
Culture: NO GROWTH
Culture: NO GROWTH
Special Requests: ADEQUATE
Special Requests: ADEQUATE

## 2022-03-10 NOTE — Progress Notes (Signed)
Patient on schedule for Renal biopsy 7/14, spoke with patient on phone with pre procedure instructions given. Made aware to not take any aspirin products after 7/8, as per md on discharge, be here at 0900, NPO after MN prior to procedure and driver post procedure/recovery/discharge, also take hypertensive meds especially am of procedure with sip of water, stated understanding.

## 2022-03-16 ENCOUNTER — Other Ambulatory Visit: Payer: Self-pay | Admitting: Physician Assistant

## 2022-03-16 DIAGNOSIS — N179 Acute kidney failure, unspecified: Secondary | ICD-10-CM

## 2022-03-17 ENCOUNTER — Other Ambulatory Visit: Payer: Self-pay

## 2022-03-17 ENCOUNTER — Ambulatory Visit
Admission: RE | Admit: 2022-03-17 | Discharge: 2022-03-17 | Disposition: A | Discharge status: Home / Self Care | Payer: PRIVATE HEALTH INSURANCE | Source: Ambulatory Visit | Attending: Internal Medicine | Admitting: Internal Medicine

## 2022-03-17 DIAGNOSIS — R809 Proteinuria, unspecified: Secondary | ICD-10-CM | POA: Insufficient documentation

## 2022-03-17 DIAGNOSIS — N179 Acute kidney failure, unspecified: Secondary | ICD-10-CM | POA: Diagnosis present

## 2022-03-17 DIAGNOSIS — I129 Hypertensive chronic kidney disease with stage 1 through stage 4 chronic kidney disease, or unspecified chronic kidney disease: Secondary | ICD-10-CM | POA: Diagnosis not present

## 2022-03-17 DIAGNOSIS — N189 Chronic kidney disease, unspecified: Secondary | ICD-10-CM | POA: Insufficient documentation

## 2022-03-17 DIAGNOSIS — N1831 Chronic kidney disease, stage 3a: Secondary | ICD-10-CM

## 2022-03-17 DIAGNOSIS — R319 Hematuria, unspecified: Secondary | ICD-10-CM | POA: Insufficient documentation

## 2022-03-17 LAB — CBC
HCT: 37.4 % — ABNORMAL LOW (ref 39.0–52.0)
Hemoglobin: 11.9 g/dL — ABNORMAL LOW (ref 13.0–17.0)
MCH: 27.7 pg (ref 26.0–34.0)
MCHC: 31.8 g/dL (ref 30.0–36.0)
MCV: 87.2 fL (ref 80.0–100.0)
Platelets: 305 10*3/uL (ref 150–400)
RBC: 4.29 MIL/uL (ref 4.22–5.81)
RDW: 14.2 % (ref 11.5–15.5)
WBC: 5.4 10*3/uL (ref 4.0–10.5)
nRBC: 0 % (ref 0.0–0.2)

## 2022-03-17 LAB — PROTIME-INR
INR: 1.1 (ref 0.8–1.2)
Prothrombin Time: 13.8 seconds (ref 11.4–15.2)

## 2022-03-17 MED ORDER — HYDRALAZINE HCL 20 MG/ML IJ SOLN
INTRAMUSCULAR | Status: AC | PRN
  Administered 2022-03-17: 10 mg via INTRAVENOUS

## 2022-03-17 MED ORDER — SODIUM CHLORIDE 0.9 % IV SOLN: INTRAVENOUS | Status: DC

## 2022-03-17 MED ORDER — FENTANYL CITRATE (PF) 100 MCG/2ML IJ SOLN
INTRAMUSCULAR | Status: AC
  Filled 2022-03-17: qty 2

## 2022-03-17 MED ORDER — MIDAZOLAM HCL 2 MG/2ML IJ SOLN
INTRAMUSCULAR | Status: AC | PRN
  Administered 2022-03-17: .5 mg via INTRAVENOUS
  Administered 2022-03-17: 1 mg via INTRAVENOUS

## 2022-03-17 MED ORDER — MIDAZOLAM HCL 2 MG/2ML IJ SOLN
INTRAMUSCULAR | Status: AC
  Filled 2022-03-17: qty 2

## 2022-03-17 MED ORDER — HYDRALAZINE HCL 20 MG/ML IJ SOLN
INTRAMUSCULAR | Status: AC
  Filled 2022-03-17: qty 1

## 2022-03-17 MED ORDER — FENTANYL CITRATE (PF) 100 MCG/2ML IJ SOLN
INTRAMUSCULAR | Status: AC | PRN
  Administered 2022-03-17 (×2): 50 ug via INTRAVENOUS

## 2022-03-17 NOTE — Procedures (Signed)
Interventional Radiology Procedure Note  Procedure: Ultrasound guided right renal biopsy  Findings: Please refer to procedural dictation for full description. 16 ga core x 2 from right inferior pole.  Gelfoam needle track emoblization.  Complications: None immediate  Estimated Blood Loss: < 5 mL  Recommendations: Strict 3 hour bedrest. Follow up Pathology results.   Ruthann Cancer, MD

## 2022-03-17 NOTE — Progress Notes (Signed)
Pt ambulatory to toilet without assist, pt eating and drinking

## 2022-03-17 NOTE — Progress Notes (Signed)
Patient clinically stable post Renal biopsy per Dr Serafina Royals, tolerated well. Stable vitals pre and post procedure. Bp remained stable pre and post  procedure. Received Versed 1.5 mg along with Fentanyl 100 mcg IV for procedure. Report given to Sarita Haver post procedure/specials.

## 2022-03-17 NOTE — H&P (Signed)
Chief Complaint: AKI, proteinuria and hematuria. Request is for random renal biopsy  Referring Physician(s): Annita Brod  Supervising Physician: Ruthann Cancer  Patient Status: [facility] - Out-pt  History of Present Illness: Devon Rios is a 56 y.o. male History of HTN, CKD. Found to be in AKI with proteinuria and hematuria during recent admission for malignant hypertension.  Team is requiring a random renal biopsy for further evaluation of possible Thrombotic microangiopathy.   Currently without any significant complaints. Patient alert and laying in bed, calm and comfortable. Denies any fevers, headache, chest pain, SOB, cough, abdominal pain, nausea, vomiting or bleeding. Return precautions and treatment recommendations and follow-up discussed with the patient  and his wife both who are agreeable with the plan.   Past Medical History:  Diagnosis Date   Arthritis    oa left knee worse than right   DM2 (diabetes mellitus, type 2) ([place]) 07/21/2014   H/O HAD GASTRIC SLEEVE IN 2018-LOST 150 LBS AND NOW IS UNDER CONTROL WITH DIET AND EXERCISE   Frequent headaches    weekly   Hypertension    H/O BEEN OF BP MEDS 06-2018 DUE TO CONTROL -PT HAD GASTRIC SLEEVE SURGERY AND LOST 150 LBS AND NOW ITS CONTROLLED WITH DIET AND EXERCISE   OSA on CPAP 2013   Severe obesity (BMI >= 40) ([place]) 07/14/2014    Past Surgical History:  Procedure Laterality Date   COLONOSCOPY WITH PROPOFOL N/A 05/13/2020   Procedure: COLONOSCOPY WITH PROPOFOL;  Surgeon: Jonathon Bellows, MD;  Location: [facility] ENDOSCOPY;  Service: Gastroenterology;  Laterality: N/A;   coloscopy  age 30   INCISION AND DRAINAGE ABSCESS N/A 09/24/2018   Procedure: INCISION AND DRAINAGE OF CYST, POSTERIOR SCALP;  Surgeon: Benjamine Sprague, DO;  Location: [facility] ORS;  Service: General;  Laterality: N/A;   LAPAROSCOPIC GASTRIC SLEEVE RESECTION N/A 06/11/2017   Procedure: LAPAROSCOPIC GASTRIC SLEEVE RESECTION WITH UPPER ENDO;  Surgeon: Johnathan Hausen,  MD;  Location: [facility] ORS;  Service: General;  Laterality: N/A;   left knee fluid drained and cortisone injection  03/2017   MENISCUS REPAIR Right    Right knee   VASECTOMY      Allergies: Patient has no known allergies.  Medications: Prior to Admission medications   Medication Sig Start Date End Date Taking? Authorizing Provider  Acetaminophen (TYLENOL ARTHRITIS PAIN PO) Take by mouth.    [provider]  amLODipine (NORVASC) 10 MG tablet Take 1 tablet (10 mg total) by mouth daily. 03/09/22   Annita Brod, MD  Blood Glucose Monitoring Suppl (BAYER CONTOUR MONITOR) w/Device KIT Check blood sugar twice a day and as directed Dx E11.9 06/13/17   Jearld Fenton, NP  Calcium 500 MG CHEW Chew 1 each by mouth 2 (two) times daily.     [provider]  carvedilol (COREG) 25 MG tablet Take 1 tablet (25 mg total) by mouth 2 (two) times daily with a meal. 03/08/22   Annita Brod, MD  hydrALAZINE (APRESOLINE) 100 MG tablet Take 1 tablet (100 mg total) by mouth every 8 (eight) hours. 03/08/22   Annita Brod, MD  isosorbide mononitrate (IMDUR) 60 MG 24 hr tablet Take 1 tablet (60 mg total) by mouth daily. 03/09/22   Annita Brod, MD  Multiple Vitamins-Minerals (CELEBRATE MULTI-COMPLETE 45) CAPS Take 1 each by mouth 2 (two) times daily.     [provider]  [facility] DELICA LANCETS 12X MISC 1 each by Does not apply route 2 (two) times daily. Dx E11.9 11/05/14  Jearld Fenton, NP     Family History  Problem Relation Age of Onset   Hypertension Mother    Diabetes Mother    Cancer Maternal Grandmother        Lung   Stroke Neg Hx     Social History   Socioeconomic History   Marital status: Married    Spouse name: Not on file   Number of children: Not on file   Years of education: Not on file   Highest education level: Not on file  Occupational History   Not on file  Tobacco Use   Smoking status: Never   Smokeless tobacco: Never  Vaping Use   Vaping  Use: Never used  Substance and Sexual Activity   Alcohol use: Not Currently    Alcohol/week: 0.0 standard drinks of alcohol   Drug use: No   Sexual activity: Yes  Other Topics Concern   Not on file  Social History Narrative   Not on file   Social Determinants of Health   Financial Resource Strain: Not on file  Food Insecurity: Not on file  Transportation Needs: Not on file  Physical Activity: Not on file  Stress: Not on file  Social Connections: Not on file     Review of Systems: A 12 point ROS discussed and pertinent positives are indicated in the HPI above.  All other systems are negative.  Review of Systems  Constitutional:  Negative for fever.  HENT:  Negative for congestion.   Respiratory:  Negative for cough and shortness of breath.   Cardiovascular:  Negative for chest pain.  Gastrointestinal:  Negative for abdominal pain.  Neurological:  Negative for headaches.  Psychiatric/Behavioral:  Negative for behavioral problems and confusion.     Vital Signs: There were no vitals taken for this visit.    Physical Exam Vitals and nursing note reviewed.  Constitutional:      Appearance: He is well-developed. He is obese.  HENT:     Head: Normocephalic.  Cardiovascular:     Rate and Rhythm: Normal rate and regular rhythm.     Heart sounds: Normal heart sounds.  Pulmonary:     Effort: Pulmonary effort is normal.     Breath sounds: Normal breath sounds.  Musculoskeletal:        General: Normal range of motion.     Cervical back: Normal range of motion.  Skin:    General: Skin is dry.  Neurological:     Mental Status: He is alert and oriented to person, place, and time.     Imaging: ECHOCARDIOGRAM COMPLETE  Result Date: 03/06/2022    ECHOCARDIOGRAM REPORT   Patient Name:   Devon Rios Date of Exam: 03/06/2022 Medical Rec #:  [id number]   Height:       66.0 in Accession #:    [id number]  Weight:       265.0 lb Date of Birth:  1966/09/01  BSA:          2.254 m  Patient Age:    56 years    BP:           189/101 mmHg Patient Gender: M           HR:           95 bpm. Exam Location:  [facility] Procedure: 2D Echo, Cardiac Doppler and Color Doppler Indications:     L46.50 CHF acute systolic  History:         Patient has no prior  history of Echocardiogram examinations.                  Risk Factors:Hypertension, Diabetes and Sleep Apnea.  Sonographer:     Rosalia Hammers Referring Phys:  [place] Diagnosing Phys: Kathlyn Sacramento MD  Sonographer Comments: Suboptimal apical window. Image acquisition challenging due to patient body habitus. IMPRESSIONS  1. Left ventricular ejection fraction, by estimation, is 60 to 65%. The left ventricle has normal function. The left ventricle has no regional wall motion abnormalities. There is severe concentric left ventricular hypertrophy. Left ventricular diastolic  parameters are consistent with Grade I diastolic dysfunction (impaired relaxation).  2. Right ventricular systolic function is normal. The right ventricular size is normal. Tricuspid regurgitation signal is inadequate for assessing PA pressure.  3. Left atrial size was mildly dilated.  4. The mitral valve is normal in structure. No evidence of mitral valve regurgitation. No evidence of mitral stenosis.  5. The aortic valve is normal in structure. Aortic valve regurgitation is not visualized. No aortic stenosis is present.  6. The inferior vena cava is normal in size with greater than 50% respiratory variability, suggesting right atrial pressure of 3 mmHg. FINDINGS  Left Ventricle: Left ventricular ejection fraction, by estimation, is 60 to 65%. The left ventricle has normal function. The left ventricle has no regional wall motion abnormalities. The left ventricular internal cavity size was normal in size. There is  severe concentric left ventricular hypertrophy. Left ventricular diastolic parameters are consistent with Grade I diastolic dysfunction (impaired relaxation). Right  Ventricle: The right ventricular size is normal. No increase in right ventricular wall thickness. Right ventricular systolic function is normal. Tricuspid regurgitation signal is inadequate for assessing PA pressure. Left Atrium: Left atrial size was mildly dilated. Right Atrium: Right atrial size was normal in size. Pericardium: There is no evidence of pericardial effusion. Mitral Valve: The mitral valve is normal in structure. No evidence of mitral valve regurgitation. No evidence of mitral valve stenosis. Tricuspid Valve: The tricuspid valve is normal in structure. Tricuspid valve regurgitation is trivial. No evidence of tricuspid stenosis. Aortic Valve: The aortic valve is normal in structure. Aortic valve regurgitation is not visualized. No aortic stenosis is present. Aortic valve mean gradient measures 6.0 mmHg. Aortic valve peak gradient measures 10.6 mmHg. Aortic valve area, by VTI measures 2.91 cm. Pulmonic Valve: The pulmonic valve was normal in structure. Pulmonic valve regurgitation is not visualized. No evidence of pulmonic stenosis. Aorta: The aortic root is normal in size and structure. Venous: The inferior vena cava is normal in size with greater than 50% respiratory variability, suggesting right atrial pressure of 3 mmHg. IAS/Shunts: No atrial level shunt detected by color flow Doppler.  LEFT VENTRICLE PLAX 2D LVIDd:         4.70 cm   Diastology LVIDs:         2.91 cm   LV e' medial:    8.59 cm/s LV PW:         1.93 cm   LV E/e' medial:  9.9 LV IVS:        1.63 cm   LV e' lateral:   7.62 cm/s LVOT diam:     2.00 cm   LV E/e' lateral: 11.2 LV SV:         70 LV SV Index:   31 LVOT Area:     3.14 cm  RIGHT VENTRICLE RV Basal diam:  3.25 cm RV S prime:     21.90 cm/s  LEFT ATRIUM             Index        RIGHT ATRIUM           Index LA diam:        4.00 cm 1.77 cm/m   RA Area:     15.40 cm LA Vol (A2C):   67.8 ml 30.08 ml/m  RA Volume:   37.20 ml  16.50 ml/m LA Vol (A4C):   50.6 ml 22.45 ml/m LA  Biplane Vol: 59.4 ml 26.35 ml/m  AORTIC VALVE AV Area (Vmax):    2.70 cm AV Area (Vmean):   2.55 cm AV Area (VTI):     2.91 cm AV Vmax:           163.00 cm/s AV Vmean:          117.000 cm/s AV VTI:            0.241 m AV Peak Grad:      10.6 mmHg AV Mean Grad:      6.0 mmHg LVOT Vmax:         140.00 cm/s LVOT Vmean:        94.900 cm/s LVOT VTI:          0.223 m LVOT/AV VTI ratio: 0.93  AORTA Ao Root diam: 3.10 cm MITRAL VALVE MV Area (PHT): 3.20 cm     SHUNTS MV Decel Time: 237 msec     Systemic VTI:  0.22 m MV E velocity: 85.10 cm/s   Systemic Diam: 2.00 cm MV A velocity: 119.00 cm/s MV E/A ratio:  0.72 Kathlyn Sacramento MD Electronically signed by Kathlyn Sacramento MD Signature Date/Time: 03/06/2022/4:49:59 PM    Final    US Renal  Result Date: 03/05/2022 CLINICAL DATA:  New onset AKI with fever EXAM: RENAL / URINARY TRACT ULTRASOUND COMPLETE COMPARISON:  None Available. FINDINGS: Right Kidney: Renal measurements: 8.7 x 5.2 x 4.3 cm = volume: 103 mL. Echogenicity increased. A 1 x 1.2 x 1.1 cm cystic lesion likely representing a simple renal cyst. Simple renal cysts, in the absence of clinically indicated signs/symptoms, require no independent follow-up. No solid mass or hydronephrosis visualized. Left Kidney: Renal measurements: 9.1 x 4.9 x 4.7 cm = volume: 109 mL. Echogenicity increased. No mass or hydronephrosis visualized. Urinary bladder: Not visualized-empty. Other: None. IMPRESSION: Increased renal parenchymal echogenicity suggestive of renal parenchymal disease. Electronically Signed   By: Iven Finn M.D.   On: 03/05/2022 15:20   DG Chest Portable 1 View  Result Date: 03/05/2022 CLINICAL DATA:  Fever, chills, shortness of breath, weakness EXAM: PORTABLE CHEST 1 VIEW COMPARISON:  Portable exam 1101 hours compared to 11/08/2016 FINDINGS: Enlargement of cardiac silhouette. Mediastinal contours and pulmonary vascularity normal. Minimal RIGHT basilar atelectasis. Lungs otherwise clear. No infiltrate,  pleural effusion, or pneumothorax. IMPRESSION: Enlargement of cardiac silhouette with minimal RIGHT basilar atelectasis. Electronically Signed   By: Lavonia Dana M.D.   On: 03/05/2022 11:18    Labs:  CBC: Recent Labs    03/05/22 1102 03/07/22 0606  WBC 6.1 7.1  HGB 12.6* 12.7*  HCT 38.6* 38.8*  PLT 183 242    COAGS: Recent Labs    03/05/22 1932  INR 1.2    BMP: Recent Labs    03/05/22 1102 03/06/22 0453 03/07/22 0606 03/08/22 0631  NA 140 141 138 136  K 3.3* 3.4* 3.5 3.6  CL 107 110 105 104  CO2 _0 GLUCOSE 100* 99 123* 113*  BUN 38* 36* 36* 41*  CALCIUM 8.8* 8.7* 9.2 9.3  CREATININE 3.88* 3.66* 3.82* 4.28*  GFRNONAA 17* 19* 18* 16*    LIVER FUNCTION TESTS: Recent Labs    03/05/22 1102  BILITOT 0.8  AST 31  ALT 24  ALKPHOS 73  PROT 7.8  ALBUMIN 3.5     Assessment and Plan:  56 y.o make outpatient. History of HTN, CKD. Found to be in AKI with proteinuria and hematuria during recent admission for malignant hypertension.  Team is requiring a random renal biopsy for further evaluation of possible Thrombotic microangiopathy.  US renal from 7.2.23 reads Increased renal parenchymal echogenicity suggestive of renal parenchymal disease. All labs and medications are within acceptable parameters. NKDA. Patient has been NPO since midnight.  Risks and benefits of random renal biopsy was discussed with the patient and/or patient's family including, but not limited to bleeding, infection, damage to adjacent structures or low yield requiring additional tests.  All of the questions were answered and there is agreement to proceed.  Consent signed and in chart.   Thank you for this interesting consult.  I greatly enjoyed meeting Perley Arthurs and look forward to participating in their care.  A copy of this report was sent to the requesting provider on this date.  Electronically Signed: Jacqualine Mau, NP 03/17/2022, 8:53 AM   I spent a total of  30  Minutes   in face to face in clinical consultation, greater than 50% of which was counseling/coordinating care for random renal biopsy

## 2022-03-27 ENCOUNTER — Encounter: Payer: Self-pay | Admitting: Nephrology

## 2022-03-27 LAB — SURGICAL PATHOLOGY

## 2022-03-31 ENCOUNTER — Encounter: Payer: Self-pay | Admitting: Nurse Practitioner

## 2022-03-31 ENCOUNTER — Ambulatory Visit (INDEPENDENT_AMBULATORY_CARE_PROVIDER_SITE_OTHER): Payer: 59 | Admitting: Nurse Practitioner

## 2022-03-31 VITALS — BP 142/76 | HR 68 | Temp 96.9°F | Resp 12 | Ht 66.0 in | Wt 290.1 lb

## 2022-03-31 DIAGNOSIS — Z125 Encounter for screening for malignant neoplasm of prostate: Secondary | ICD-10-CM

## 2022-03-31 DIAGNOSIS — I10 Essential (primary) hypertension: Secondary | ICD-10-CM | POA: Diagnosis not present

## 2022-03-31 DIAGNOSIS — E049 Nontoxic goiter, unspecified: Secondary | ICD-10-CM | POA: Insufficient documentation

## 2022-03-31 DIAGNOSIS — E119 Type 2 diabetes mellitus without complications: Secondary | ICD-10-CM | POA: Diagnosis not present

## 2022-03-31 DIAGNOSIS — Z23 Encounter for immunization: Secondary | ICD-10-CM

## 2022-03-31 DIAGNOSIS — G4733 Obstructive sleep apnea (adult) (pediatric): Secondary | ICD-10-CM | POA: Diagnosis not present

## 2022-03-31 DIAGNOSIS — N184 Chronic kidney disease, stage 4 (severe): Secondary | ICD-10-CM

## 2022-03-31 DIAGNOSIS — E66813 Obesity, class 3: Secondary | ICD-10-CM

## 2022-03-31 DIAGNOSIS — Z9989 Dependence on other enabling machines and devices: Secondary | ICD-10-CM

## 2022-03-31 DIAGNOSIS — Z0000 Encounter for general adult medical examination without abnormal findings: Secondary | ICD-10-CM

## 2022-03-31 LAB — MICROALBUMIN / CREATININE URINE RATIO
Creatinine,U: 136.9 mg/dL
Microalb Creat Ratio: 63 mg/g — ABNORMAL HIGH (ref 0.0–30.0)
Microalb, Ur: 86.2 mg/dL — ABNORMAL HIGH (ref 0.0–1.9)

## 2022-03-31 LAB — TSH: TSH: 2.02 u[IU]/mL (ref 0.35–5.50)

## 2022-03-31 LAB — PSA: PSA: 0.6 ng/mL (ref 0.10–4.00)

## 2022-03-31 NOTE — Assessment & Plan Note (Signed)
Patient currently maintained on amlodipine, losartan, hydralazine, isosorbide.  Patient is being followed by nephrology closely.  Continue following with nephrology take medication as prescribed

## 2022-03-31 NOTE — Assessment & Plan Note (Signed)
Patient currently managed through Dr. Holley Raring at [facility].  Recently saw about 4 days ago placed him on Lasix has a follow-up Monday for labs and then a follow-up next month for an in person visit.  Patient recently underwent kidney biopsy

## 2022-03-31 NOTE — Assessment & Plan Note (Signed)
Patient is post gastric resection.  Continue working on healthy lifestyle modifications.

## 2022-03-31 NOTE — Assessment & Plan Note (Signed)
Last A1c was 6.0%.  Currently controlled with diet continue to monitor

## 2022-03-31 NOTE — Assessment & Plan Note (Signed)
Felt an enlarged thyroid on exam today.  Ultrasound of thyroid placed today.  Information given to patient to call and schedule at discharge

## 2022-03-31 NOTE — Patient Instructions (Signed)
Nice to see you today I need you to call and get the ultrasound of your thyroid scheduled with the information given at discharge We did update your tetanus shot today Information of the shingles vaccine on the back Follow up with me in 6 months

## 2022-03-31 NOTE — Assessment & Plan Note (Signed)
Patient currently maintained on CPAP machine.  States he had for approximately 3 years and states his last lasted about 5 years.  He is not currently established with a group to manage his OSA.  Ambulatory referral to [facility] pulmonology placed today

## 2022-03-31 NOTE — Progress Notes (Signed)
Established Patient Office Visit  Subjective   Patient ID: Devon Rios, male    DOB: [birth year]  Age: 56 y.o. MRN: [id number]  Chief Complaint  Patient presents with   Transfer of Care   Diabetes      HTN: States that he does check his blood pressure twice a day and keeps a log. States that it   OSA: wears it every night. Has no trouble tolerating it. States that he has had his machine. [place]  DM2: Last A1C was 6.0. States that it is once a day. States that 103-123 and highest it has been 144.  Renal disease: Patient is currently followed by Dr. Anthonette Legato at [place]. Last office visit was 03/27/2022. States he will see him 04/24/2022  Colonoscopy: Last 09/0/2021, repeat 10  PSA: Due  Covid: Up-to-date  TDAP: Update today  Shingrix: Information discussed and given to patient    Review of Systems  Constitutional:  Negative for chills and fever.  Respiratory:  Negative for shortness of breath.   Cardiovascular:  Positive for leg swelling. Negative for chest pain.  Gastrointestinal:  Negative for diarrhea, nausea and vomiting.       BM every other day  Genitourinary:  Negative for dysuria and hematuria.  Neurological:  Negative for tingling and headaches.  Psychiatric/Behavioral:  Negative for hallucinations and suicidal ideas.       Objective:     BP (!) 142/76   Pulse 68   Temp (!) 96.9 F (36.1 C)   Resp 12   Ht 5\' 6"  (1.676 m)   Wt 290 lb 2 oz (131.6 kg)   SpO2 97%   BMI 46.83 kg/m    Physical Exam Vitals and nursing note reviewed.  Constitutional:      Appearance: Normal appearance. He is obese.  HENT:     Right Ear: Tympanic membrane, ear canal and external ear normal.     Left Ear: Tympanic membrane, ear canal and external ear normal.  Neck:     Thyroid: Thyromegaly present.  Cardiovascular:     Rate and Rhythm: Normal rate and regular rhythm.     Pulses: Normal pulses.     Heart sounds: Normal heart sounds.   Pulmonary:     Effort: Pulmonary effort is normal.     Breath sounds: Normal breath sounds.  Abdominal:     General: Bowel sounds are normal. There is no distension.     Palpations: There is no mass.     Tenderness: There is no abdominal tenderness.     Hernia: No hernia is present.  Musculoskeletal:     Right lower leg: 2+ Edema present.     Left lower leg: 2+ Edema present.  Lymphadenopathy:     Cervical: No cervical adenopathy.  Neurological:     Mental Status: He is alert.      No results found for any visits on 03/31/22.    The 10-year ASCVD risk score (Arnett DK, et al., 2019) is: 23.9%    Assessment & Plan:   Problem List Items Addressed This Visit       Cardiovascular and Mediastinum   Essential hypertension    Patient currently maintained on amlodipine, losartan, hydralazine, isosorbide.  Patient is being followed by nephrology closely.  Continue following with nephrology take medication as prescribed      Relevant Medications   furosemide (LASIX) 40 MG tablet   losartan (COZAAR) 50 MG tablet   Other Relevant Orders   TSH  Respiratory   OSA on CPAP    Patient currently maintained on CPAP machine.  States he had for approximately 3 years and states his last lasted about 5 years.  He is not currently established with a group to manage his OSA.  Ambulatory referral to [facility] pulmonology placed today      Relevant Orders   Ambulatory referral to Pulmonology     Endocrine   Diabetes mellitus without complication ([place])    Last A1c was 6.0%.  Currently controlled with diet continue to monitor      Relevant Medications   losartan (COZAAR) 50 MG tablet   Other Relevant Orders   Microalbumin/Creatinine Ratio, Urine   Enlarged thyroid    Felt an enlarged thyroid on exam today.  Ultrasound of thyroid placed today.  Information given to patient to call and schedule at discharge      Relevant Orders   TSH   US THYROID     Genitourinary   Chronic  kidney disease, stage 4 (severe) ([place])    Patient currently managed through Dr. Holley Raring at [facility].  Recently saw about 4 days ago placed him on Lasix has a follow-up Monday for labs and then a follow-up next month for an in person visit.  Patient recently underwent kidney biopsy        Other   Obesity, Class III, BMI 40-49.9 (morbid obesity) ([place])    Patient is post gastric resection.  Continue working on healthy lifestyle modifications.      Encounter for medical examination to establish care - Primary   Other Visit Diagnoses     Screening for prostate cancer       Relevant Orders   PSA   Need for prophylactic vaccination with tetanus-diphtheria (Td)       Relevant Orders   Td : Tetanus/diphtheria >7yo Preservative  free (Completed)       Return in about 6 months (around 10/01/2022) for Recheck on chronic conditions.    Romilda Garret, NP

## 2022-04-07 ENCOUNTER — Ambulatory Visit
Admission: RE | Admit: 2022-04-07 | Discharge: 2022-04-07 | Disposition: A | Discharge status: Home / Self Care | Payer: 59 | Source: Ambulatory Visit | Attending: Nurse Practitioner | Admitting: Nurse Practitioner

## 2022-04-07 DIAGNOSIS — E049 Nontoxic goiter, unspecified: Secondary | ICD-10-CM | POA: Diagnosis present

## 2022-07-03 ENCOUNTER — Ambulatory Visit (INDEPENDENT_AMBULATORY_CARE_PROVIDER_SITE_OTHER): Payer: 59 | Admitting: Adult Health

## 2022-07-03 ENCOUNTER — Encounter: Payer: Self-pay | Admitting: Adult Health

## 2022-07-03 VITALS — BP 124/80 | HR 65 | Temp 98.1°F | Ht 66.0 in | Wt 290.4 lb

## 2022-07-03 DIAGNOSIS — Z6841 Body Mass Index (BMI) 40.0 and over, adult: Secondary | ICD-10-CM

## 2022-07-03 DIAGNOSIS — G4733 Obstructive sleep apnea (adult) (pediatric): Secondary | ICD-10-CM | POA: Diagnosis not present

## 2022-07-03 NOTE — Progress Notes (Signed)
@Patient  ID: Devon Rios, male    DOB: May 07, 1966, 56 y.o.   MRN: [id number]  Chief Complaint  Patient presents with   sleep consult    Referring provider: Michela Pitcher, NP  HPI: 56 yo male followed for obstructive sleep apnea on nocturnal BiPAP and Insomnia  Former patient of Dr. Ashby Dawes.  Medical history significant for CKD and HTN, PreDM   TEST/EVENTS :  Spilt night sleep study 04/2011 , severe OSA -AHI 98.9/hr , SPo2 low at 70%., required BIPAP to control events.   07/03/2022 Follow up : OSA  Patient presents today to reestablish for sleep apnea.  Last seen January 2020.  Patient has underlying severe sleep apnea is on nocturnal BiPAP.  Patient says he wears his BiPAP every single night cannot sleep without it.  Patient feels rested and feels that he benefits from BiPAP.  BiPAP download shows excellent compliance with daily average usage at 8.5 hours.  Patient is on auto BiPAP with IPAP max 22 cm H2O and EPAP minimum 16 cm H2O.  AHI 2.4/hour. Uses Full face mask.  Needs new BIPAP , his is getting old. Worried it will stop working. Seems like seals are getting lose.  Active , walks 2 miles a day. Gets in 10K steps a day.  Primary school teacher. Travels around to facilities .    No Known Allergies  Immunization History  Administered Date(s) Administered   Janssen (J&J) SARS-COV-2 Vaccination 12/29/2019   Td 03/31/2022   Tdap 10/06/2007    Past Medical History:  Diagnosis Date   Arthritis    oa left knee worse than right   Chronic kidney disease    DM2 (diabetes mellitus, type 2) ([place]) 07/21/2014   H/O HAD GASTRIC SLEEVE IN 2018-LOST 150 LBS AND NOW IS UNDER CONTROL WITH DIET AND EXERCISE   Frequent headaches    weekly   Hypertension    H/O BEEN OF BP MEDS 06-2018 DUE TO CONTROL -PT HAD GASTRIC SLEEVE SURGERY AND LOST 150 LBS AND NOW ITS CONTROLLED WITH DIET AND EXERCISE   OSA on CPAP 2013   Severe obesity (BMI >= 40) ([place]) 07/14/2014    Tobacco History: Social  History   Tobacco Use  Smoking Status Never   Passive exposure: Never  Smokeless Tobacco Never   Counseling given: Not Answered   Outpatient Medications Prior to Visit  Medication Sig Dispense Refill   Acetaminophen (TYLENOL ARTHRITIS PAIN PO) Take by mouth.     amLODipine (NORVASC) 10 MG tablet Take 1 tablet (10 mg total) by mouth daily. 30 tablet 1   Calcium Carb-Cholecalciferol (CALCIUM 500 + D3 PO) Take by mouth. Calcium 500 mg and Vitamin D3 35mcg     carvedilol (COREG) 25 MG tablet Take 1 tablet (25 mg total) by mouth 2 (two) times daily with a meal. 60 tablet 1   furosemide (LASIX) 40 MG tablet Take 40 mg by mouth daily.     hydrALAZINE (APRESOLINE) 100 MG tablet Take 1 tablet (100 mg total) by mouth every 8 (eight) hours. 90 tablet 1   isosorbide mononitrate (IMDUR) 60 MG 24 hr tablet Take 1 tablet (60 mg total) by mouth daily. 30 tablet 1   losartan (COZAAR) 50 MG tablet Take 50 mg by mouth daily.     Multiple Vitamins-Minerals (CELEBRATE MULTI-COMPLETE 45) CAPS Take 1 each by mouth 2 (two) times daily.      ONETOUCH DELICA LANCETS 93J MISC 1 each by Does not apply route 2 (two) times daily. Dx  E11.9 100 each 6   OVER THE COUNTER MEDICATION Colon Cleanse-herbal laxative-as needed     No facility-administered medications prior to visit.     Review of Systems:   Constitutional:   No  weight loss, night sweats,  Fevers, chills, fatigue, or  lassitude.  HEENT:   No headaches,  Difficulty swallowing,  Tooth/dental problems, or  Sore throat,                No sneezing, itching, ear ache, nasal congestion, post nasal drip,   CV:  No chest pain,  Orthopnea, PND, swelling in lower extremities, anasarca, dizziness, palpitations, syncope.   GI  No heartburn, indigestion, abdominal pain, nausea, vomiting, diarrhea, change in bowel habits, loss of appetite, bloody stools.   Resp: No shortness of breath with exertion or at rest.  No excess mucus, no productive cough,  No  non-productive cough,  No coughing up of blood.  No change in color of mucus.  No wheezing.  No chest wall deformity  Skin: no rash or lesions.  GU: no dysuria, change in color of urine, no urgency or frequency.  No flank pain, no hematuria   MS:  No joint pain or swelling.  No decreased range of motion.  No back pain.    Physical Exam  BP 124/80 (BP Location: Left Arm, Cuff Size: Large)   Pulse 65   Temp 98.1 F (36.7 C) (Temporal)   Ht 5\' 6"  (1.676 m)   Wt 290 lb 6.4 oz (131.7 kg)   SpO2 95%   BMI 46.87 kg/m   GEN: A/Ox3; pleasant , NAD, well nourished    HEENT:  [place]/AT,   NOSE-clear, THROAT-clear, no lesions, no postnasal drip or exudate noted. Class 3-4 MP airway   NECK:  Supple w/ fair ROM; no JVD; normal carotid impulses w/o bruits; no thyromegaly or nodules palpated; no lymphadenopathy.    RESP  Clear  P & A; w/o, wheezes/ rales/ or rhonchi. no accessory muscle use, no dullness to percussion  CARD:  RRR, no m/r/g, no peripheral edema, pulses intact, no cyanosis or clubbing.  GI:   Soft & nt; nml bowel sounds; no organomegaly or masses detected.   Musco: Warm bil, no deformities or joint swelling noted.   Neuro: alert, no focal deficits noted.    Skin: Warm, no lesions or rashes    Lab Results:     ProBNP No results found for: "PROBNP"  Imaging: No results found.        No data to display          No results found for: "NITRICOXIDE"      Assessment & Plan:   OSA (obstructive sleep apnea) Excellent control and compliance on BIPAP  Order for new machine  Patient education given  - discussed how weight can impact sleep and risk for sleep disordered breathing - discussed options to assist with weight loss: combination of diet modification, cardiovascular and strength training exercises   - had an extensive discussion regarding the adverse health consequences related to untreated sleep disordered breathing - specifically discussed the  risks for hypertension, coronary artery disease, cardiac dysrhythmias, cerebrovascular disease, and diabetes - lifestyle modification discussed   - discussed how sleep disruption can increase risk of accidents, particularly when driving - safe driving practices were discussed   Plan  Patient Instructions  Order for new BIPAP  Continue on BIPAP At bedtime   Keep up good work.  Work on healthy weight  Do not  drive if sleepy  Follow up in 1 year and As needed       Morbid obesity with BMI of 45.0-49.9, adult ([place]) Healthy weight loss discussed     I spent 30   minutes dedicated to the care of this patient on the date of this encounter to include pre-visit review of records, face-to-face time with the patient discussing conditions above, post visit ordering of testing, clinical documentation with the electronic health record, making appropriate referrals as documented, and communicating necessary findings to members of the patients care team.   Rexene Edison, NP 07/03/2022

## 2022-07-03 NOTE — Patient Instructions (Signed)
Order for new BIPAP  Continue on BIPAP At bedtime   Keep up good work.  Work on healthy weight  Do not drive if sleepy  Follow up in 1 year and As needed

## 2022-07-03 NOTE — Assessment & Plan Note (Signed)
Healthy weight loss discussed 

## 2022-07-03 NOTE — Assessment & Plan Note (Signed)
Excellent control and compliance on BIPAP  Order for new machine  Patient education given  - discussed how weight can impact sleep and risk for sleep disordered breathing - discussed options to assist with weight loss: combination of diet modification, cardiovascular and strength training exercises   - had an extensive discussion regarding the adverse health consequences related to untreated sleep disordered breathing - specifically discussed the risks for hypertension, coronary artery disease, cardiac dysrhythmias, cerebrovascular disease, and diabetes - lifestyle modification discussed   - discussed how sleep disruption can increase risk of accidents, particularly when driving - safe driving practices were discussed   Plan  Patient Instructions  Order for new BIPAP  Continue on BIPAP At bedtime   Keep up good work.  Work on healthy weight  Do not drive if sleepy  Follow up in 1 year and As needed

## 2022-08-08 ENCOUNTER — Encounter: Payer: Self-pay | Admitting: Nurse Practitioner

## 2022-08-08 ENCOUNTER — Telehealth: Payer: Self-pay | Admitting: Nurse Practitioner

## 2022-08-08 ENCOUNTER — Ambulatory Visit (INDEPENDENT_AMBULATORY_CARE_PROVIDER_SITE_OTHER): Payer: 59 | Admitting: Nurse Practitioner

## 2022-08-08 VITALS — BP 138/78 | HR 58 | Temp 97.4°F | Ht 66.0 in | Wt 287.5 lb

## 2022-08-08 DIAGNOSIS — I10 Essential (primary) hypertension: Secondary | ICD-10-CM | POA: Diagnosis not present

## 2022-08-08 DIAGNOSIS — E119 Type 2 diabetes mellitus without complications: Secondary | ICD-10-CM

## 2022-08-08 DIAGNOSIS — N184 Chronic kidney disease, stage 4 (severe): Secondary | ICD-10-CM

## 2022-08-08 MED ORDER — OZEMPIC (0.25 OR 0.5 MG/DOSE) 2 MG/3ML ~~LOC~~ SOPN: 0.2500 mg | PEN_INJECTOR | SUBCUTANEOUS | 0 refills | Status: DC

## 2022-08-08 NOTE — Progress Notes (Signed)
Established Patient Office Visit  Subjective   Patient ID: Devon Rios, male    DOB: May 05, 1966  Age: 56 y.o. MRN: [id number]  Chief Complaint  Patient presents with   Follow-up    Discuss starting Ozempic per kidney doctor    HPI   CKD: Managed by Dr. Holley Raring from [facility]. Saw him on 07/31/2022. States that he may need a transplant in the future and needs to start working on weight loss per patient report. He is diabetic that is controlled via diet currently.  Patient does check his glucose is in the range from the 90s to the 1 teens.  No history of hypoglycemia per patient report.  Patient denies history of medullary thyroid cancer, multiple endocrine neoplasia syndrome type II, pancreatitis.     Review of Systems  Constitutional:  Negative for chills and fever.  Respiratory:  Negative for shortness of breath.   Cardiovascular:  Negative for chest pain.      Objective:     BP 138/78 (BP Location: Left Arm, Patient Position: Sitting) Comment: with patient'a machine 116/58  Pulse (!) 58   Temp (!) 97.4 F (36.3 C) (Skin)   Ht 5\' 6"  (1.676 m)   Wt 287 lb 8 oz (130.4 kg)   SpO2 98%   BMI 46.40 kg/m  BP Readings from Last 3 Encounters:  08/08/22 138/78  07/03/22 124/80  03/31/22 (!) 142/76   Wt Readings from Last 3 Encounters:  08/08/22 287 lb 8 oz (130.4 kg)  07/03/22 290 lb 6.4 oz (131.7 kg)  03/31/22 290 lb 2 oz (131.6 kg)      Physical Exam Vitals and nursing note reviewed.  Constitutional:      Appearance: Normal appearance. He is obese.  Cardiovascular:     Rate and Rhythm: Normal rate and regular rhythm.     Heart sounds: Normal heart sounds.  Pulmonary:     Effort: Pulmonary effort is normal.     Breath sounds: Normal breath sounds.  Neurological:     Mental Status: He is alert.      No results found for any visits on 08/08/22.    The 10-year ASCVD risk score (Arnett DK, et al., 2019) is: 23.7%    Assessment & Plan:   Problem  List Items Addressed This Visit       Cardiovascular and Mediastinum   Essential hypertension - Primary     Endocrine   Diabetes mellitus without complication ([place])    Patient last A1c 6.0 but does carry diagnosis of diabetes.  Given patient's renal function he does not qualify for metformin as this is contraindicated.  Will trial patient on a GLP-1 receptor agonist to aid in overall continue control and weight loss which will help his other comorbidities.  Patient will follow-up in 3 months sooner if needed      Relevant Medications   Semaglutide,0.25 or 0.5MG /DOS, (OZEMPIC, 0.25 OR 0.5 MG/DOSE,) 2 MG/3ML SOPN     Genitourinary   Chronic kidney disease, stage 4 (severe) ([facility])    Patient is followed by Dr. Zollie Scale at [facility].  Recently saw him.  Continue following with Dr. Zollie Scale as recommended        Other   Obesity, Class III, BMI 40-49.9 (morbid obesity) ([place])    For continuity lifestyle modifications.  Will start patient on Ozempic pending insurance approval      Relevant Medications   Semaglutide,0.25 or 0.5MG /DOS, (OZEMPIC, 0.25 OR 0.5 MG/DOSE,) 2 MG/3ML SOPN  Return in about 3 months (around 11/07/2022) for Medication check .    Romilda Garret, NP

## 2022-08-08 NOTE — Telephone Encounter (Signed)
I am starting patient on ozempic and was wanting education on use. Can we get him in office and educate him on the pen use please

## 2022-08-08 NOTE — Patient Instructions (Addendum)
Nice to see you today You will do Ozempic 0.25mg  once a week for a month then you will go up to Ozempic 0.5mg  once a week until I see you again.

## 2022-08-08 NOTE — Assessment & Plan Note (Signed)
For continuity lifestyle modifications.  Will start patient on Ozempic pending insurance approval

## 2022-08-08 NOTE — Assessment & Plan Note (Signed)
Patient last A1c 6.0 but does carry diagnosis of diabetes.  Given patient's renal function he does not qualify for metformin as this is contraindicated.  Will trial patient on a GLP-1 receptor agonist to aid in overall continue control and weight loss which will help his other comorbidities.  Patient will follow-up in 3 months sooner if needed

## 2022-08-08 NOTE — Assessment & Plan Note (Signed)
Patient is followed by Dr. Zollie Scale at [facility].  Recently saw him.  Continue following with Dr. Zollie Scale as recommended

## 2022-08-11 ENCOUNTER — Telehealth: Payer: Self-pay | Admitting: Nurse Practitioner

## 2022-08-11 NOTE — Telephone Encounter (Signed)
Pt called stating he was prescribed Semaglutide,0.25 or 0.5MG /DOS, (OZEMPIC, 0.25 OR 0.5 MG/DOSE,) 2 MG/3ML SOPN & was told once he received it, schedule an appt with the pharmacist so he can be shown how to operate the pen. Call back # [phone]

## 2022-08-14 NOTE — Telephone Encounter (Signed)
Please schedule patient.

## 2022-08-14 NOTE — Telephone Encounter (Signed)
See other phone note

## 2022-08-15 NOTE — Progress Notes (Signed)
Appointment has been scheduled for 08/17/2022 at 11:45. Patient was made aware to bring medication to office with him.  Charlene Brooke, CPP notified  Marijean Niemann, [place] Clinical Pharmacy Assistant [phone]

## 2022-08-15 NOTE — Telephone Encounter (Signed)
Patient called and stated he is waiting on a call to be shown how to use the ozempic and he stated he doesn't won't the medication to be just sitting in his fridge.

## 2022-08-17 ENCOUNTER — Ambulatory Visit: Payer: PRIVATE HEALTH INSURANCE

## 2022-08-17 NOTE — Telephone Encounter (Addendum)
Met with patient in office. He brought his Ozempic pen which he took out of fridge right before he came in today. Demonstrated proper injection technique and patient successfully administered his first dose of 0.25 mg today. Discussed once weekly dosing of 0.25 mg x 4 weeks then increasing to 0.5 mg thereafter. Discussed potential for nausea and lifestyle modifications to limit nausea. Discussed he may not see weight loss until he starts higher doses. Discussed he may see some decrease in blood sugars but Ozempic on its own should not cause hypoglycemia < 70. Pt voiced understanding of above and all questions answered.

## 2022-09-28 ENCOUNTER — Other Ambulatory Visit: Payer: Self-pay | Admitting: Nurse Practitioner

## 2022-09-28 DIAGNOSIS — E119 Type 2 diabetes mellitus without complications: Secondary | ICD-10-CM

## 2022-09-28 MED ORDER — OZEMPIC (0.25 OR 0.5 MG/DOSE) 2 MG/3ML ~~LOC~~ SOPN: 0.5000 mg | PEN_INJECTOR | SUBCUTANEOUS | 1 refills | Status: DC

## 2022-10-02 ENCOUNTER — Ambulatory Visit: Payer: PRIVATE HEALTH INSURANCE | Admitting: Nurse Practitioner

## 2022-11-06 ENCOUNTER — Encounter: Payer: Self-pay | Admitting: Nurse Practitioner

## 2022-11-07 ENCOUNTER — Ambulatory Visit: Payer: PRIVATE HEALTH INSURANCE | Admitting: Nurse Practitioner

## 2022-11-09 ENCOUNTER — Ambulatory Visit: Payer: 59 | Admitting: Nurse Practitioner

## 2022-11-09 ENCOUNTER — Encounter: Payer: Self-pay | Admitting: Nurse Practitioner

## 2022-11-09 VITALS — BP 98/58 | HR 67 | Temp 99.4°F | Resp 16 | Ht 66.0 in | Wt 271.2 lb

## 2022-11-09 DIAGNOSIS — N184 Chronic kidney disease, stage 4 (severe): Secondary | ICD-10-CM | POA: Diagnosis not present

## 2022-11-09 DIAGNOSIS — I10 Essential (primary) hypertension: Secondary | ICD-10-CM

## 2022-11-09 DIAGNOSIS — E119 Type 2 diabetes mellitus without complications: Secondary | ICD-10-CM | POA: Diagnosis not present

## 2022-11-09 LAB — POCT GLYCOSYLATED HEMOGLOBIN (HGB A1C): Hemoglobin A1C: 5.3 % (ref 4.0–5.6)

## 2022-11-09 MED ORDER — OZEMPIC (0.25 OR 0.5 MG/DOSE) 2 MG/3ML ~~LOC~~ SOPN: 0.5000 mg | PEN_INJECTOR | SUBCUTANEOUS | 0 refills | Status: DC

## 2022-11-09 NOTE — Patient Instructions (Signed)
Nice to see you today Your A1C was 5.3%!!!! You can go up to the 0.'5mg'$  to aid in weight loss.  If you feel like you are getting low sugar often let me know and we can reduce the dose of medication  Continue checking your blood pressure. As you continue to lose weight we may need to adjust the blood pressure medication

## 2022-11-09 NOTE — Assessment & Plan Note (Signed)
Patient currently maintained on amlodipine 10 mg, carvedilol 25 mg, isosorbide 60 mg, and losartan 50 mg, patient tolerates medications well.  He does check blood pressure at home blood pressure is soft in office but patient denies any acute hypotensive symptoms.  As he continues to lose weight he will need to continue to monitor his blood pressure we may have to reduce some of the medications.  Patient was made aware

## 2022-11-09 NOTE — Assessment & Plan Note (Signed)
Patient's A1c has trended down nicely.  Last A1c was 6.0 A1c today was 5.3.  Patient is not having any episodes of hypoglycemia.  He has experienced some good weight loss.  Will continue Ozempic but go to 0.5 mg.  We will likely not titrate up any further given patient's A1c.  If patient starts experiencing hypoglycemia he will reach out to the office and we will decrease the dose back to 0.25 mg of Ozempic.

## 2022-11-09 NOTE — Progress Notes (Signed)
Established Patient Office Visit  Subjective   Patient ID: Devon Rios, male    DOB: 12-18-1965  Age: 57 y.o. MRN: [id number]  Chief Complaint  Patient presents with   Hypertension       DM2: Patient with a history of well-controlled diabetes.  Last A1c was 6.0.  Patient saw me at the request of his nephrologist to start on Ozempic to help with weight loss for anticipated kidney transplant per patient report.  He does see [facility] most recent visit 10/30/2022.  Patient has been previously maintained on diet alone.  He was started on Ozempic 0.25 mg to titrate up to 0.5 mg. States that he takes it every Thursday. States that he is on the 0.'25mg'$ .  Obesity: states that with the weight loss he is able to walk more with his knee. States that the weather is better and he is getting out more walking. States that he has a cruiser bike that he will ride in the am for approx 1 hour   Htn: Patient currently maintained on carvedilol, hydralazine, isosorbide, losartan.  Patient is checking his blood pressure at home and he has not recorded in his smart device.  Patient denies any lightheadedness or dizziness.  States he does have times for blood pressure will be on the soft side but then trends back up.     Review of Systems  Constitutional:  Negative for chills and fever.  Respiratory:  Negative for shortness of breath.   Cardiovascular:  Negative for chest pain.  Gastrointestinal:  Positive for constipation (use stool softner intermittently). Negative for abdominal pain, diarrhea, nausea and vomiting.  Genitourinary:  Negative for dysuria and hematuria.  Neurological:  Negative for dizziness and headaches.      Objective:     BP (!) 98/58   Pulse 67   Temp 99.4 F (37.4 C)   Resp 16   Ht '5\' 6"'$  (1.676 m)   Wt 271 lb 4 oz (123 kg)   SpO2 97%   BMI 43.78 kg/m  BP Readings from Last 3 Encounters:  11/09/22 (!) 98/58  08/08/22 138/78  07/03/22 124/80   Wt Readings from Last  3 Encounters:  11/09/22 271 lb 4 oz (123 kg)  08/08/22 287 lb 8 oz (130.4 kg)  07/03/22 290 lb 6.4 oz (131.7 kg)      Physical Exam Vitals and nursing note reviewed.  Constitutional:      Appearance: Normal appearance.  Cardiovascular:     Rate and Rhythm: Normal rate and regular rhythm.     Heart sounds: Normal heart sounds.  Pulmonary:     Effort: Pulmonary effort is normal.     Breath sounds: Normal breath sounds.  Neurological:     Mental Status: He is alert.      Results for orders placed or performed in visit on 11/09/22  POCT glycosylated hemoglobin (Hb A1C)  Result Value Ref Range   Hemoglobin A1C 5.3 4.0 - 5.6 %   HbA1c POC (<> result, manual entry)     HbA1c, POC (prediabetic range)     HbA1c, POC (controlled diabetic range)        The 10-year ASCVD risk score (Arnett DK, et al., 2019) is: 13.1%    Assessment & Plan:   Problem List Items Addressed This Visit       Cardiovascular and Mediastinum   Essential hypertension    Patient currently maintained on amlodipine 10 mg, carvedilol 25 mg, isosorbide 60 mg, and losartan 50  mg, patient tolerates medications well.  He does check blood pressure at home blood pressure is soft in office but patient denies any acute hypotensive symptoms.  As he continues to lose weight he will need to continue to monitor his blood pressure we may have to reduce some of the medications.  Patient was made aware        Endocrine   Diabetes mellitus without complication ([place]) - Primary    Patient's A1c has trended down nicely.  Last A1c was 6.0 A1c today was 5.3.  Patient is not having any episodes of hypoglycemia.  He has experienced some good weight loss.  Will continue Ozempic but go to 0.5 mg.  We will likely not titrate up any further given patient's A1c.  If patient starts experiencing hypoglycemia he will reach out to the office and we will decrease the dose back to 0.25 mg of Ozempic.      Relevant Medications    Semaglutide,0.25 or 0.'5MG'$ /DOS, (OZEMPIC, 0.25 OR 0.5 MG/DOSE,) 2 MG/3ML SOPN   Other Relevant Orders   POCT glycosylated hemoglobin (Hb A1C) (Completed)     Genitourinary   Chronic kidney disease, stage 4 (severe) ([facility])    Patient is being followed by nephrology through [facility].  At the request of nephrologist patient was placed on Ozempic to help with weight loss in anticipation of a kidney transplant in the future.  Did review last note from nephrology office.  Kidney numbers are stable       Return in about 3 months (around 02/09/2023) for Weight recheck/ozempic recheck .    Romilda Garret, NP

## 2022-11-09 NOTE — Assessment & Plan Note (Signed)
Patient is being followed by nephrology through [facility].  At the request of nephrologist patient was placed on Ozempic to help with weight loss in anticipation of a kidney transplant in the future.  Did review last note from nephrology office.  Kidney numbers are stable

## 2023-01-11 ENCOUNTER — Encounter (HOSPITAL_COMMUNITY): Payer: Self-pay | Admitting: *Deleted

## 2023-01-19 ENCOUNTER — Other Ambulatory Visit: Payer: Self-pay | Admitting: Nurse Practitioner

## 2023-01-19 DIAGNOSIS — E119 Type 2 diabetes mellitus without complications: Secondary | ICD-10-CM

## 2023-01-23 MED ORDER — OZEMPIC (0.25 OR 0.5 MG/DOSE) 2 MG/3ML ~~LOC~~ SOPN: 0.5000 mg | PEN_INJECTOR | SUBCUTANEOUS | 0 refills | Status: DC

## 2023-02-16 ENCOUNTER — Ambulatory Visit: Payer: PRIVATE HEALTH INSURANCE | Admitting: Nurse Practitioner

## 2023-02-16 ENCOUNTER — Encounter: Payer: Self-pay | Admitting: Nurse Practitioner

## 2023-02-16 VITALS — BP 110/60 | HR 75 | Temp 98.4°F | Resp 16 | Ht 66.0 in | Wt 262.1 lb

## 2023-02-16 DIAGNOSIS — Z7985 Long-term (current) use of injectable non-insulin antidiabetic drugs: Secondary | ICD-10-CM | POA: Diagnosis not present

## 2023-02-16 DIAGNOSIS — I10 Essential (primary) hypertension: Secondary | ICD-10-CM

## 2023-02-16 DIAGNOSIS — E118 Type 2 diabetes mellitus with unspecified complications: Secondary | ICD-10-CM | POA: Diagnosis not present

## 2023-02-16 MED ORDER — SEMAGLUTIDE (1 MG/DOSE) 4 MG/3ML ~~LOC~~ SOPN
1.0000 mg | PEN_INJECTOR | SUBCUTANEOUS | 0 refills | Status: DC
Start: 2023-02-16 — End: 2023-05-26

## 2023-02-16 NOTE — Assessment & Plan Note (Signed)
Blood pressure well-controlled on current regimen.  Did have a discussion that being on ablative blocker could mask hypoglycemic.  Also encouraged him to continue checking blood pressure at home as he loses weight his blood pressure can drop and medication adjustment may need to be done

## 2023-02-16 NOTE — Assessment & Plan Note (Signed)
Patient doing well on Ozempic 0.5 mg.  Lost a total of 25 pounds we will titrate up to 1 mg dose continue taking sugar at least daily more if needed.  Did review signs and symptoms of hypoglycemia.

## 2023-02-16 NOTE — Progress Notes (Signed)
Established Patient Office Visit  Subjective   Patient ID: Devon Rios, male    DOB: 08/27/1966  Age: 57 y.o. MRN: [id number]  Chief Complaint  Patient presents with   Weight Check    HPI  Weight check: patient was seen by me on 11/09/2022.  Patient has a history of diabetes that was well-controlled and states at the request of his nephrologist they wanted to see if he can start Ozempic For weight loss for anticipated kidney transplant.  Patient also has a comorbidity of hypertension.  Patient is here for a weight recheck to make sure he was not experiencing hypoglycemia. Patient is on ozempic 0.5  States that he is checking his sugar 1-2 times a day. States running around 100 States that he is walking more. Walking 1-2 miles daily with the dogs and biking on the weekends approx 5 miles at a time   Review of Systems  Constitutional:  Negative for chills and fever.  Respiratory:  Negative for shortness of breath.   Cardiovascular:  Negative for chest pain.  Gastrointestinal:  Negative for abdominal pain, constipation, diarrhea, nausea and vomiting.  Neurological:  Negative for headaches.      Objective:     BP 110/60   Pulse 75   Temp 98.4 F (36.9 C)   Resp 16   Ht 5\' 6"  (1.676 m)   Wt 262 lb 2 oz (118.9 kg)   SpO2 98%   BMI 42.31 kg/m  BP Readings from Last 3 Encounters:  02/16/23 110/60  11/09/22 (!) 98/58  08/08/22 138/78   Wt Readings from Last 3 Encounters:  02/16/23 262 lb 2 oz (118.9 kg)  11/09/22 271 lb 4 oz (123 kg)  08/08/22 287 lb 8 oz (130.4 kg)      Physical Exam Vitals and nursing note reviewed.  Constitutional:      Appearance: Normal appearance.  Cardiovascular:     Rate and Rhythm: Normal rate and regular rhythm.     Heart sounds: Normal heart sounds.  Pulmonary:     Effort: Pulmonary effort is normal.     Breath sounds: Normal breath sounds.  Abdominal:     General: Bowel sounds are normal.  Neurological:     Mental Status: He is  alert.      No results found for any visits on 02/16/23.    The 10-year ASCVD risk score (Arnett DK, et al., 2019) is: 16.1%    Assessment & Plan:   Problem List Items Addressed This Visit       Cardiovascular and Mediastinum   Essential hypertension - Primary    Blood pressure well-controlled on current regimen.  Did have a discussion that being on ablative blocker could mask hypoglycemic.  Also encouraged him to continue checking blood pressure at home as he loses weight his blood pressure can drop and medication adjustment may need to be done        Endocrine   Controlled type 2 diabetes mellitus with complication, without long-term current use of insulin ([facility])    Patient doing well on Ozempic 0.5 mg.  Lost a total of 25 pounds we will titrate up to 1 mg dose continue taking sugar at least daily more if needed.  Did review signs and symptoms of hypoglycemia.      Relevant Medications   Semaglutide, 1 MG/DOSE, 4 MG/3ML SOPN     Other   Morbid obesity ([facility])    Patient has lost approximate 25 pounds since December.  He  is being more follow-up when he is eating portion sizes.  He is doing physical activity it sounds like daily continue working on lifestyle modifications praise given for weight loss      Relevant Medications   Semaglutide, 1 MG/DOSE, 4 MG/3ML SOPN   Other Visit Diagnoses     Long-term current use of injectable noninsulin antidiabetic medication           Return in about 3 months (around 05/19/2023) for DM recheck.    Audria Nine, NP

## 2023-02-16 NOTE — Assessment & Plan Note (Signed)
Patient has lost approximate 25 pounds since December.  He is being more follow-up when he is eating portion sizes.  He is doing physical activity it sounds like daily continue working on lifestyle modifications praise given for weight loss

## 2023-02-16 NOTE — Patient Instructions (Signed)
Nice to see you today You can give yourself a second 0.5mg  injection of ozempic today since it is injection day Follow up with me in 3 months, sooner if you need me

## 2023-05-24 ENCOUNTER — Ambulatory Visit: Payer: PRIVATE HEALTH INSURANCE | Admitting: Nurse Practitioner

## 2023-05-26 ENCOUNTER — Other Ambulatory Visit: Payer: Self-pay | Admitting: Nurse Practitioner

## 2023-05-26 DIAGNOSIS — E118 Type 2 diabetes mellitus with unspecified complications: Secondary | ICD-10-CM

## 2023-05-28 MED ORDER — SEMAGLUTIDE (1 MG/DOSE) 4 MG/3ML ~~LOC~~ SOPN
1.0000 mg | PEN_INJECTOR | SUBCUTANEOUS | 0 refills | Status: DC
Start: 2023-05-28 — End: 2023-05-30

## 2023-05-30 ENCOUNTER — Encounter: Payer: Self-pay | Admitting: Nurse Practitioner

## 2023-05-30 ENCOUNTER — Ambulatory Visit: Payer: PRIVATE HEALTH INSURANCE | Admitting: Nurse Practitioner

## 2023-05-30 VITALS — BP 100/60 | HR 99 | Temp 97.7°F | Ht 66.0 in | Wt 236.2 lb

## 2023-05-30 DIAGNOSIS — Z7984 Long term (current) use of oral hypoglycemic drugs: Secondary | ICD-10-CM | POA: Diagnosis not present

## 2023-05-30 DIAGNOSIS — E118 Type 2 diabetes mellitus with unspecified complications: Secondary | ICD-10-CM

## 2023-05-30 DIAGNOSIS — N184 Chronic kidney disease, stage 4 (severe): Secondary | ICD-10-CM

## 2023-05-30 DIAGNOSIS — I10 Essential (primary) hypertension: Secondary | ICD-10-CM

## 2023-05-30 LAB — POCT GLYCOSYLATED HEMOGLOBIN (HGB A1C): Hemoglobin A1C: 5.3 % (ref 4.0–5.6)

## 2023-05-30 MED ORDER — SEMAGLUTIDE (1 MG/DOSE) 4 MG/3ML ~~LOC~~ SOPN
1.0000 mg | PEN_INJECTOR | SUBCUTANEOUS | 1 refills | Status: DC
Start: 2023-05-30 — End: 2023-08-17

## 2023-05-30 NOTE — Assessment & Plan Note (Signed)
Patient currently followed by nephrology.  Did review most recent renal labs

## 2023-05-30 NOTE — Patient Instructions (Signed)
Nice to see you today Your A1C is 5.3%. That is great Keep up the hard work with the weight loss and exercise I want to see you in 6 months, sooner if you need me

## 2023-05-30 NOTE — Assessment & Plan Note (Signed)
Patient currently maintained on amlodipine, hydralazine, carvedilol, isosorbide.  Blood pressure within normal limits today.  We did discuss signs and symptoms of hypotension.  In the event patient does have hypotension hydralazine will be the first medication I would hold or consider the beta-blocker or calcium channel blocker secondary.

## 2023-05-30 NOTE — Assessment & Plan Note (Signed)
Patient is A1c of 5.3% today.  Patient tolerating Ozempic 1 mg once weekly well.  Has had appreciable weight loss.  Patient is still checking glucose at home continue no hypoglycemia per patient report

## 2023-05-30 NOTE — Progress Notes (Signed)
Established Patient Office Visit  Subjective   Patient ID: Devon Rios, male    DOB: 1966-08-19  Age: 57 y.o. MRN: [id number]  Chief Complaint  Patient presents with   Follow-up    Will get eye exam done this year.    HPI  DM2: patient is currently maintained on ozempic 1 mg. States that he is checking his sugar daily States the lowest is 79. States that he checked this morning is 88.  States that he will move his bowel every other day and use the ocassional laxative or stool softener   States that he is walking 2 miles a day He is getting 2 meals a day He is drinking water throughout the day. Sometimes an unsweet tea or cranberry juice   HTN: patient is currently on carvedilol, hydralazine, isosorbide, and amlodipine.  He is followed by nephrology.  Patient has had some low readings but asymptomatic.  States he has skipped one of his blood pressure medications when it was reading well at home.  Told him to reach out with readings and we will give him guidance in regards to which blood pressure medication to hold if needed  CKD: he is currently followed by nephrology.  Nephrology request patient's weight to be 215 to 220 pounds.    Review of Systems  Constitutional:  Negative for chills and fever.  Respiratory:  Negative for shortness of breath.   Cardiovascular:  Negative for chest pain.  Gastrointestinal:  Positive for constipation and vomiting. Negative for abdominal pain, diarrhea and nausea.  Neurological:  Negative for dizziness and headaches.      Objective:     BP 100/60   Pulse 99   Temp 97.7 F (36.5 C) (Temporal)   Ht 5\' 6"  (1.676 m)   Wt 236 lb 3.2 oz (107.1 kg)   SpO2 98%   BMI 38.12 kg/m  BP Readings from Last 3 Encounters:  05/30/23 100/60  02/16/23 110/60  11/09/22 (!) 98/58   Wt Readings from Last 3 Encounters:  05/30/23 236 lb 3.2 oz (107.1 kg)  02/16/23 262 lb 2 oz (118.9 kg)  11/09/22 271 lb 4 oz (123 kg)      Physical Exam Vitals  and nursing note reviewed.  Constitutional:      Appearance: Normal appearance.  Cardiovascular:     Rate and Rhythm: Normal rate and regular rhythm.     Heart sounds: Normal heart sounds.  Pulmonary:     Effort: Pulmonary effort is normal.     Breath sounds: Normal breath sounds.  Abdominal:     General: Bowel sounds are normal. There is no distension.     Palpations: There is no mass.     Tenderness: There is no abdominal tenderness.     Hernia: No hernia is present.  Neurological:     Mental Status: He is alert.      Results for orders placed or performed in visit on 05/30/23  POCT glycosylated hemoglobin (Hb A1C)  Result Value Ref Range   Hemoglobin A1C 5.3 4.0 - 5.6 %   HbA1c POC (<> result, manual entry)     HbA1c, POC (prediabetic range)     HbA1c, POC (controlled diabetic range)        The 10-year ASCVD risk score (Arnett DK, et al., 2019) is: 13.6%    Assessment & Plan:   Problem List Items Addressed This Visit       Cardiovascular and Mediastinum   Essential hypertension  Patient currently maintained on amlodipine, hydralazine, carvedilol, isosorbide.  Blood pressure within normal limits today.  We did discuss signs and symptoms of hypotension.  In the event patient does have hypotension hydralazine will be the first medication I would hold or consider the beta-blocker or calcium channel blocker secondary.        Endocrine   Controlled type 2 diabetes mellitus with complication, without long-term current use of insulin (HCC) - Primary    Patient is A1c of 5.3% today.  Patient tolerating Ozempic 1 mg once weekly well.  Has had appreciable weight loss.  Patient is still checking glucose at home continue no hypoglycemia per patient report      Relevant Medications   Semaglutide, 1 MG/DOSE, 4 MG/3ML SOPN   Other Relevant Orders   POCT glycosylated hemoglobin (Hb A1C) (Completed)     Genitourinary   Chronic kidney disease, stage 4 (severe) (HCC)     Patient currently followed by nephrology.  Did review most recent renal labs       Return in about 6 months (around 11/27/2023) for CPE and Labs, DM recheck.    Audria Nine, NP

## 2023-06-11 ENCOUNTER — Ambulatory Visit: Payer: PRIVATE HEALTH INSURANCE | Admitting: Nurse Practitioner

## 2023-08-17 ENCOUNTER — Encounter: Payer: Self-pay | Admitting: Nurse Practitioner

## 2023-08-17 DIAGNOSIS — E118 Type 2 diabetes mellitus with unspecified complications: Secondary | ICD-10-CM

## 2023-08-17 MED ORDER — SEMAGLUTIDE (1 MG/DOSE) 4 MG/3ML ~~LOC~~ SOPN: 1.0000 mg | PEN_INJECTOR | SUBCUTANEOUS | 0 refills | Status: DC

## 2023-11-27 ENCOUNTER — Telehealth: Payer: Self-pay | Admitting: Nurse Practitioner

## 2023-11-27 ENCOUNTER — Encounter: Payer: Self-pay | Admitting: Nurse Practitioner

## 2023-11-27 ENCOUNTER — Ambulatory Visit (INDEPENDENT_AMBULATORY_CARE_PROVIDER_SITE_OTHER): Payer: PRIVATE HEALTH INSURANCE | Admitting: Nurse Practitioner

## 2023-11-27 VITALS — BP 102/60 | HR 73 | Temp 98.1°F | Ht 65.5 in | Wt 217.0 lb

## 2023-11-27 DIAGNOSIS — Z23 Encounter for immunization: Secondary | ICD-10-CM | POA: Diagnosis not present

## 2023-11-27 DIAGNOSIS — G4733 Obstructive sleep apnea (adult) (pediatric): Secondary | ICD-10-CM

## 2023-11-27 DIAGNOSIS — E118 Type 2 diabetes mellitus with unspecified complications: Secondary | ICD-10-CM

## 2023-11-27 DIAGNOSIS — N184 Chronic kidney disease, stage 4 (severe): Secondary | ICD-10-CM

## 2023-11-27 DIAGNOSIS — Z0000 Encounter for general adult medical examination without abnormal findings: Secondary | ICD-10-CM

## 2023-11-27 DIAGNOSIS — I10 Essential (primary) hypertension: Secondary | ICD-10-CM

## 2023-11-27 DIAGNOSIS — Z7984 Long term (current) use of oral hypoglycemic drugs: Secondary | ICD-10-CM

## 2023-11-27 DIAGNOSIS — Z125 Encounter for screening for malignant neoplasm of prostate: Secondary | ICD-10-CM

## 2023-11-27 LAB — COMPREHENSIVE METABOLIC PANEL
ALT: 13 U/L (ref 0–53)
AST: 15 U/L (ref 0–37)
Albumin: 4.4 g/dL (ref 3.5–5.2)
Alkaline Phosphatase: 58 U/L (ref 39–117)
BUN: 38 mg/dL — ABNORMAL HIGH (ref 6–23)
CO2: 27 meq/L (ref 19–32)
Calcium: 9.4 mg/dL (ref 8.4–10.5)
Chloride: 103 meq/L (ref 96–112)
Creatinine, Ser: 3.03 mg/dL — ABNORMAL HIGH (ref 0.40–1.50)
GFR: 22.1 mL/min — ABNORMAL LOW (ref >60.00)
Glucose, Bld: 90 mg/dL (ref 70–99)
Potassium: 3.5 meq/L (ref 3.5–5.1)
Sodium: 140 meq/L (ref 135–145)
Total Bilirubin: 0.6 mg/dL (ref 0.2–1.2)
Total Protein: 7.1 g/dL (ref 6.0–8.3)

## 2023-11-27 LAB — MICROALBUMIN / CREATININE URINE RATIO
Creatinine,U: 83.6 mg/dL
Microalb Creat Ratio: 20.6 mg/g (ref 0.0–30.0)
Microalb, Ur: 1.7 mg/dL (ref 0.0–1.9)

## 2023-11-27 LAB — LIPID PANEL
Cholesterol: 124 mg/dL (ref 0–200)
HDL: 42.4 mg/dL (ref >39.00)
LDL Cholesterol: 73 mg/dL (ref 0–99)
NonHDL: 81.18
Total CHOL/HDL Ratio: 3
Triglycerides: 41 mg/dL (ref 0.0–149.0)
VLDL: 8.2 mg/dL (ref 0.0–40.0)

## 2023-11-27 LAB — POCT GLYCOSYLATED HEMOGLOBIN (HGB A1C): Hemoglobin A1C: 5 % (ref 4.0–5.6)

## 2023-11-27 LAB — PSA: PSA: 1.77 ng/mL (ref 0.10–4.00)

## 2023-11-27 LAB — CBC
HCT: 32.7 % — ABNORMAL LOW (ref 39.0–52.0)
Hemoglobin: 10.9 g/dL — ABNORMAL LOW (ref 13.0–17.0)
MCHC: 33.3 g/dL (ref 30.0–36.0)
MCV: 89.7 fl (ref 78.0–100.0)
Platelets: 149 10*3/uL — ABNORMAL LOW (ref 150.0–400.0)
RBC: 3.65 Mil/uL — ABNORMAL LOW (ref 4.22–5.81)
RDW: 14.3 % (ref 11.5–15.5)
WBC: 3.7 10*3/uL — ABNORMAL LOW (ref 4.0–10.5)

## 2023-11-27 NOTE — Patient Instructions (Signed)
 Nice to see you today I will be in touch with the labs once I have them Follow up with me in 6 months, sooner if you need me  We did update your pneumonia vaccine  Consider getting the shingrix vaccines

## 2023-11-27 NOTE — Telephone Encounter (Signed)
 Patient dropped of form for provider to complete placed in provider box at front desk

## 2023-11-27 NOTE — Assessment & Plan Note (Signed)
 Patient is adherent on CPAP and 5 close with pulmonology.  Continue

## 2023-11-27 NOTE — Progress Notes (Signed)
 Established Patient Office Visit  Subjective   Patient ID: Devon Rios, male    DOB: 07/31/1966  Age: 58 y.o. MRN: [id number]  Chief Complaint  Patient presents with   Annual Exam    HPI  HTN: currently maintained on amlodipine 10, carvedilol 25, hydralazine 100mg , isisorbide 60, losartan 50 and followed by Mady Haagensen, MD nephrologist. [address] [facility] he is doing well. Checks blood pressure twice a day   OSA: followed by pulmonology and is on CPAP  DM2: currently maintained on ozempic 1mg  weekly.  He is checking sugars daily at home. He got 99 - 110.  No hypoglycemia  for complete physical and follow up of chronic conditions.  Immunizations: -Tetanus: Completed in 2023 -Influenza: refused  -Shingles: Discussed in office -Pneumonia: prevnar 20 today  Diet: Fair diet. He is eating 2 meals a day and a snack. State that he will have coffee, juice, tea, water Exercise: he is biking 3 days a week and walking every day . Walks 2 miles a day  Eye exam: Completed in 2023. Needs updating  Dental exam: Completes semi-annually    Colonoscopy: Completed in 05/13/2020, repeat in 10 years Lung Cancer Screening: NA  PSA: Due  Sleep: going to bed around 10-11 and will get up 630-7. That's he feels rested. He does wear his CPAP       Review of Systems  Constitutional:  Negative for chills and fever.  Respiratory:  Negative for shortness of breath.   Cardiovascular:  Negative for chest pain and leg swelling.  Gastrointestinal:  Negative for abdominal pain, blood in stool, constipation, diarrhea, nausea and vomiting.       BM every day to every other day   He is using fiber gummies   Genitourinary:  Negative for dysuria and hematuria.  Neurological:  Negative for tingling and headaches.  Psychiatric/Behavioral:  Negative for hallucinations and suicidal ideas.       Objective:     BP 102/60   Pulse 73   Temp 98.1 F (36.7 C) (Oral)   Ht 5' 5.5" (1.664 m)   Wt 217 lb  (98.4 kg)   SpO2 99%   BMI 35.56 kg/m  BP Readings from Last 3 Encounters:  11/27/23 102/60  05/30/23 100/60  02/16/23 110/60   Wt Readings from Last 3 Encounters:  11/27/23 217 lb (98.4 kg)  05/30/23 236 lb 3.2 oz (107.1 kg)  02/16/23 262 lb 2 oz (118.9 kg)   SpO2 Readings from Last 3 Encounters:  11/27/23 99%  05/30/23 98%  02/16/23 98%      Physical Exam Vitals and nursing note reviewed.  Constitutional:      Appearance: Normal appearance.  HENT:     Right Ear: Tympanic membrane, ear canal and external ear normal.     Left Ear: Tympanic membrane, ear canal and external ear normal.     Mouth/Throat:     Mouth: Mucous membranes are moist.     Pharynx: Oropharynx is clear.  Eyes:     Extraocular Movements: Extraocular movements intact.     Pupils: Pupils are equal, round, and reactive to light.  Cardiovascular:     Rate and Rhythm: Normal rate and regular rhythm.     Pulses: Normal pulses.     Heart sounds: Normal heart sounds.  Pulmonary:     Effort: Pulmonary effort is normal.     Breath sounds: Normal breath sounds.  Abdominal:     General: Bowel sounds are normal. There is no distension.  Palpations: There is no mass.     Tenderness: There is no abdominal tenderness.     Hernia: No hernia is present.  Musculoskeletal:     Right lower leg: No edema.     Left lower leg: No edema.  Lymphadenopathy:     Cervical: No cervical adenopathy.  Skin:    General: Skin is warm.  Neurological:     General: No focal deficit present.     Mental Status: He is alert.     Deep Tendon Reflexes:     Reflex Scores:      Bicep reflexes are 2+ on the right side and 2+ on the left side.      Patellar reflexes are 2+ on the right side and 2+ on the left side.    Comments: Bilateral upper and lower extremity strength 5/5  Psychiatric:        Mood and Affect: Mood normal.        Behavior: Behavior normal.        Thought Content: Thought content normal.        Judgment:  Judgment normal.    Title   Diabetic Foot Exam - detailed Is there a history of foot ulcer?: No Is there a foot ulcer now?: No Is there swelling?: No Is there elevated skin temperature?: No Is there abnormal foot shape?: No Is there a claw toe deformity?: No Are the toenails long?: No Are the toenails thick?: No Are the toenails ingrown?: No Pulse Foot Exam completed.: Yes   Right Posterior Tibialis: Present Left posterior Tibialis: Present   Right Dorsalis Pedis: Present Left Dorsalis Pedis: Present     Sensory Foot Exam Completed.: Yes Semmes-Weinstein Monofilament Test "+" means "has sensation" and "-" means "no sensation"      Image components are not supported.   Image components are not supported. Image components are not supported.  Tuning Fork Comments All 10 sites tested sensation intact     Results for orders placed or performed in visit on 11/27/23  CBC  Result Value Ref Range   WBC 3.7 (L) 4.0 - 10.5 K/uL   RBC 3.65 (L) 4.22 - 5.81 Mil/uL   Platelets 149.0 (L) 150.0 - 400.0 K/uL   Hemoglobin 10.9 (L) 13.0 - 17.0 g/dL   HCT 16.1 (L) 09.6 - 04.5 %   MCV 89.7 78.0 - 100.0 fl   MCHC 33.3 30.0 - 36.0 g/dL   RDW 40.9 81.1 - 91.4 %  Comprehensive metabolic panel  Result Value Ref Range   Sodium 140 135 - 145 mEq/L   Potassium 3.5 3.5 - 5.1 mEq/L   Chloride 103 96 - 112 mEq/L   CO2 27 19 - 32 mEq/L   Glucose, Bld 90 70 - 99 mg/dL   BUN 38 (H) 6 - 23 mg/dL   Creatinine, Ser 7.82 (H) 0.40 - 1.50 mg/dL   Total Bilirubin 0.6 0.2 - 1.2 mg/dL   Alkaline Phosphatase 58 39 - 117 U/L   AST 15 0 - 37 U/L   ALT 13 0 - 53 U/L   Total Protein 7.1 6.0 - 8.3 g/dL   Albumin 4.4 3.5 - 5.2 g/dL   GFR 95.62 (L) >13.08 mL/min   Calcium 9.4 8.4 - 10.5 mg/dL  PSA  Result Value Ref Range   PSA 1.77 0.10 - 4.00 ng/mL  Lipid panel  Result Value Ref Range   Cholesterol 124 0 - 200 mg/dL   Triglycerides 65.7 0.0 - 149.0 mg/dL  HDL 42.40 >39.00 mg/dL   VLDL 8.2 0.0 -  65.7 mg/dL   LDL Cholesterol 73 0 - 99 mg/dL   Total CHOL/HDL Ratio 3    NonHDL 81.18   Microalbumin / creatinine urine ratio  Result Value Ref Range   Microalb, Ur 1.7 0.0 - 1.9 mg/dL   Creatinine,U 84.6 mg/dL   Microalb Creat Ratio 20.6 0.0 - 30.0 mg/g  POCT glycosylated hemoglobin (Hb A1C)  Result Value Ref Range   Hemoglobin A1C 5.0 4.0 - 5.6 %   HbA1c POC (<> result, manual entry)     HbA1c, POC (prediabetic range)     HbA1c, POC (controlled diabetic range)        The ASCVD Risk score (Arnett DK, et al., 2019) failed to calculate for the following reasons:   The valid total cholesterol range is 130 to 320 mg/dL    Assessment & Plan:   Problem List Items Addressed This Visit       Cardiovascular and Mediastinum   Essential hypertension   Patient currently maintained on amlodipine 2 mg daily, carvedilol 25 mg, hydralazine 100 mg, isosorbide 60 mg, losartan 50 mg daily.  Patient is followed by nephrology does check blood pressure twice daily at home patient blood pressure well-controlled without adverse drug event continue medication as prescribed follow-up with nephrology as recommended      Relevant Orders   CBC (Completed)   Comprehensive metabolic panel (Completed)   Lipid panel (Completed)     Respiratory   OSA on CPAP   Patient is adherent on CPAP and 5 close with pulmonology.  Continue        Endocrine   Controlled type 2 diabetes mellitus with complication, without long-term current use of insulin (HCC)   Patient currently maintained on Ozempic 1 mg weekly.  A1c well-controlled at 5.0%.  Patient is likely low with weight reduction starting with 290 patient is 217 pounds today.  Patient is doing healthy lifestyle modifications continue with both medication and lifestyle modifications      Relevant Orders   POCT glycosylated hemoglobin (Hb A1C) (Completed)   CBC (Completed)   Comprehensive metabolic panel (Completed)   Lipid panel (Completed)    Microalbumin / creatinine urine ratio (Completed)     Genitourinary   Chronic kidney disease, stage 4 (severe) (HCC)   Patient currently followed by nephrology.  Continue following with nephrology as recommended      Relevant Orders   Lipid panel (Completed)     Other   Preventative health care - Primary   Discussed age-appropriate immunizations and screening exams.  Reviewed patient's personal, surgical, social, family history.  Patient up-to-date on all age-appropriate vaccinations he like.  We did discuss shingles vaccine today and updated Prevnar 20 today.  Patient up-to-date on CRC screening.  PSA drawn today for prostate cancer screening.  Patient was given information at discharge about preventative healthcare maintenance with [employer] guidance.  Detailed foot exam done today      Other Visit Diagnoses       Screening for prostate cancer       Relevant Orders   PSA (Completed)     Need for pneumococcal 20-valent conjugate vaccination       Relevant Orders   Pneumococcal conjugate vaccine 20-valent (Prevnar 20) (Completed)       Return in about 6 months (around 05/29/2024) for DM recheck, BP recheck.    Audria Nine, NP

## 2023-11-27 NOTE — Assessment & Plan Note (Signed)
 Discussed age-appropriate immunizations and screening exams.  Reviewed patient's personal, surgical, social, family history.  Patient up-to-date on all age-appropriate vaccinations he like.  We did discuss shingles vaccine today and updated Prevnar 20 today.  Patient up-to-date on CRC screening.  PSA drawn today for prostate cancer screening.  Patient was given information at discharge about preventative healthcare maintenance with [employer] guidance.  Detailed foot exam done today

## 2023-11-27 NOTE — Assessment & Plan Note (Signed)
 Patient currently maintained on amlodipine 2 mg daily, carvedilol 25 mg, hydralazine 100 mg, isosorbide 60 mg, losartan 50 mg daily.  Patient is followed by nephrology does check blood pressure twice daily at home patient blood pressure well-controlled without adverse drug event continue medication as prescribed follow-up with nephrology as recommended

## 2023-11-27 NOTE — Assessment & Plan Note (Signed)
 Patient currently maintained on Ozempic 1 mg weekly.  A1c well-controlled at 5.0%.  Patient is likely low with weight reduction starting with 290 patient is 217 pounds today.  Patient is doing healthy lifestyle modifications continue with both medication and lifestyle modifications

## 2023-11-27 NOTE — Assessment & Plan Note (Signed)
 Patient currently followed by nephrology.  Continue following with nephrology as recommended

## 2023-11-28 NOTE — Telephone Encounter (Signed)
 Left voicemail for patient to call the office back.   Left mychart message as well. Form will be faxed.

## 2023-11-28 NOTE — Telephone Encounter (Signed)
 Placed in pcp box for review and sign.

## 2023-11-29 ENCOUNTER — Encounter: Payer: Self-pay | Admitting: Nurse Practitioner

## 2023-11-29 NOTE — Telephone Encounter (Signed)
 Spoke with pt. Form will be at front office for pick up. See other encounter.

## 2023-11-29 NOTE — Telephone Encounter (Signed)
 Contacted pt and relayed lab results. Pt verbalized understanding and has no concerns.  Confirmed that pt will come by the office to pick up physical form.

## 2023-11-29 NOTE — Telephone Encounter (Signed)
 Patient came by and picked up ppw.

## 2024-01-02 ENCOUNTER — Encounter (HOSPITAL_COMMUNITY): Payer: Self-pay | Admitting: *Deleted

## 2024-05-06 ENCOUNTER — Other Ambulatory Visit: Payer: Self-pay | Admitting: Nurse Practitioner

## 2024-05-06 DIAGNOSIS — E118 Type 2 diabetes mellitus with unspecified complications: Secondary | ICD-10-CM

## 2024-05-29 ENCOUNTER — Ambulatory Visit: Payer: PRIVATE HEALTH INSURANCE | Admitting: Nurse Practitioner

## 2024-06-05 ENCOUNTER — Ambulatory Visit: Payer: PRIVATE HEALTH INSURANCE | Admitting: Nurse Practitioner

## 2024-06-05 VITALS — BP 102/60 | HR 72 | Temp 98.1°F | Ht 65.5 in | Wt 198.2 lb

## 2024-06-05 DIAGNOSIS — Z7985 Long-term (current) use of injectable non-insulin antidiabetic drugs: Secondary | ICD-10-CM

## 2024-06-05 DIAGNOSIS — Z23 Encounter for immunization: Secondary | ICD-10-CM | POA: Diagnosis not present

## 2024-06-05 DIAGNOSIS — I10 Essential (primary) hypertension: Secondary | ICD-10-CM

## 2024-06-05 DIAGNOSIS — N529 Male erectile dysfunction, unspecified: Secondary | ICD-10-CM | POA: Diagnosis not present

## 2024-06-05 DIAGNOSIS — N184 Chronic kidney disease, stage 4 (severe): Secondary | ICD-10-CM

## 2024-06-05 DIAGNOSIS — E118 Type 2 diabetes mellitus with unspecified complications: Secondary | ICD-10-CM | POA: Diagnosis not present

## 2024-06-05 LAB — POCT GLYCOSYLATED HEMOGLOBIN (HGB A1C): Hemoglobin A1C: 4.9 % (ref 4.0–5.6)

## 2024-06-05 NOTE — Patient Instructions (Signed)
 Nice to see you today I have made a referral. If you do not hear from them in 2 weeks let me know I want to see you in 6 months for you physical, sooner if you need me

## 2024-06-05 NOTE — Progress Notes (Signed)
 Established Patient Office Visit  Subjective   Patient ID: Devon Rios, male    DOB: March 23, 1966  Age: 58 y.o. MRN: [id number]  Chief Complaint  Patient presents with   Diabetes   Blood Pressure Check    107/68 this morning. Bp has been doing good.     HPI Discussed the use of AI scribe software for clinical note transcription with the patient, who gave verbal consent to proceed.  History of Present Illness Devon Rios is a 58 year old male with diabetes and hypertension who presents for follow-up on weight management and kidney health.  He has experienced a significant weight loss of nearly 40 pounds over the past year, reducing from 236 pounds to 198 pounds. This is attributed to increased physical activity, including walking a mile both in the morning or evening and achieving 7,000 to 10,000 steps daily. He is concerned about maintaining this activity level during the winter months and is considering indoor walking options.  His appetite is generally good, eating one to two meals a day, typically around midday and in the evening, with occasional snacks like grapes. He avoids high carbohydrate foods, particularly bread and pasta, and is mindful of not consuming sugary drinks. His A1c is reported to be 4.9. No symptoms of hypoglycemia, such as lightheadedness or dizziness, although he occasionally experiences low blood sugar readings, which he manages by consuming small amounts of sugar-containing foods.  He reports a recent blood pressure measurement of 107/68 mmHg. He is currently on carvedilol  and isosorbide  mononitrate. He reports regular bowel movements, aided by occasional use of stool softeners and monthly colon cleanses to prevent constipation.  He expresses concerns about his sexual health, noting difficulties with both achieving and maintaining erections, which he attributes to his blood pressure medications. He is interested in exploring treatment options that do not interfere  with his current medications.     Review of Systems  Constitutional:  Negative for chills and fever.  Respiratory:  Negative for shortness of breath.   Cardiovascular:  Negative for chest pain.  Gastrointestinal:  Positive for constipation. Negative for nausea and vomiting.  Neurological:  Negative for headaches.      Objective:     BP 102/60   Pulse 72   Temp 98.1 F (36.7 C) (Oral)   Ht 5' 5.5 (1.664 m)   Wt 198 lb 3.2 oz (89.9 kg)   SpO2 98%   BMI 32.48 kg/m  BP Readings from Last 3 Encounters:  06/05/24 102/60  11/27/23 102/60  05/30/23 100/60   Wt Readings from Last 3 Encounters:  06/05/24 198 lb 3.2 oz (89.9 kg)  11/27/23 217 lb (98.4 kg)  05/30/23 236 lb 3.2 oz (107.1 kg)   SpO2 Readings from Last 3 Encounters:  06/05/24 98%  11/27/23 99%  05/30/23 98%      Physical Exam Vitals and nursing note reviewed.  Constitutional:      Appearance: Normal appearance.  Cardiovascular:     Rate and Rhythm: Normal rate and regular rhythm.     Heart sounds: Normal heart sounds.  Pulmonary:     Effort: Pulmonary effort is normal.     Breath sounds: Normal breath sounds.  Abdominal:     General: Bowel sounds are normal.  Neurological:     Mental Status: He is alert.      Results for orders placed or performed in visit on 06/05/24  POCT glycosylated hemoglobin (Hb A1C)  Result Value Ref Range   Hemoglobin A1C 4.9  4.0 - 5.6 %   HbA1c POC (<> result, manual entry)     HbA1c, POC (prediabetic range)     HbA1c, POC (controlled diabetic range)        The ASCVD Risk score (Arnett DK, et al., 2019) failed to calculate for the following reasons:   The valid total cholesterol range is 130 to 320 mg/dL    Assessment & Plan:   Problem List Items Addressed This Visit       Cardiovascular and Mediastinum   Essential hypertension     Endocrine   Controlled type 2 diabetes mellitus with complication, without long-term current use of insulin  (HCC) - Primary    Relevant Orders   POCT glycosylated hemoglobin (Hb A1C) (Completed)     Genitourinary   Chronic kidney disease, stage 4 (severe) (HCC)   Other Visit Diagnoses       Erectile dysfunction, unspecified erectile dysfunction type       Relevant Orders   Ambulatory referral to Urology     Need for influenza vaccination       Relevant Orders   Flu vaccine trivalent PF, 6mos and older(Flulaval,Afluria,Fluarix,Fluzone) (Completed)      Assessment and Plan Assessment & Plan Type 2 diabetes mellitus Diabetes well-controlled with A1c 4.9% and blood sugar <110 mg/dL. No hypoglycemia symptoms reported, but carvedilol  may mask them. - Continue current diabetes management regimen, including Ozempic  1 mg weekly. - Educated on recognizing hypoglycemia symptoms, especially cold sweats, due to carvedilol  use.  Chronic kidney disease Stage 4 CKD managed with weight loss and dietary control. Recent 40-pound weight loss beneficial. Renal labs checked by nephrologist. - Continue weight management and dietary control as advised by nephrologist. - Follow up with nephrologist in early November.  Erectile dysfunction ED likely related to isosorbide  mononitrate and antihypertensives. Viagra and Cialis contraindicated due to hypotension risk. - Refer to urologist in [place] for further evaluation and management options.  Constipation Managed with regular bowel movements. Occasional stool softeners and monthly colon cleanse used. - Continue current regimen with stool softeners as needed. - Avoid excessive diarrhea to prevent kidney damage.  General Health Maintenance Discussed flu vaccination due to high risk for severe disease. Addressed concerns about post-vaccination illness. Flu vaccine is not live and takes two weeks for immunity. - Administer flu vaccine today.  Follow-Up Next follow-up scheduled for six months for physical and blood work unless issues arise sooner. - Schedule follow-up  appointment in six months for physical and blood work.  Return in about 6 months (around 12/04/2024) for CPE and Labs.    Adina Crandall, NP

## 2024-06-26 ENCOUNTER — Ambulatory Visit (INDEPENDENT_AMBULATORY_CARE_PROVIDER_SITE_OTHER): Payer: PRIVATE HEALTH INSURANCE | Admitting: Urology

## 2024-06-26 VITALS — BP 99/63 | HR 78 | Wt 196.0 lb

## 2024-06-26 DIAGNOSIS — N529 Male erectile dysfunction, unspecified: Secondary | ICD-10-CM

## 2024-06-26 NOTE — Patient Instructions (Addendum)
 You cannot take medications to help with erections with one of your blood pressure medications(isosorbide  mononitrate/Imdur ).  If this can be stopped safely and your blood pressure remains normal, or your primary doctor or nephrologist can find an alternative blood pressure medicine, we are happy to trial Cialis to see if we can help with the erections   Understanding Erectile Dysfunction (ED)  What is ED? Erectile Dysfunction, or ED, is when a man has trouble getting or keeping an erection enough for sex. It is common and can happen at any age but is more common as men get older.  What Causes ED? ED can happen for many reasons, including:    Stress or feeling anxious Health problems like diabetes, heart disease, or high blood pressure Lifestyle habits like smoking, drinking too much alcohol, drug use, or not enough exercise Certain medicines(some blood pressure medications, antidepressants, sedatives)  Symptoms of ED:   Difficulty getting an erection Trouble keeping an erection during sex Reduced interest in sex  Treatment Options There are different ways to treat ED. Talk to your doctor to find what's best for you.  Lifestyle Changes   Exercise regularly Eat healthy foods Quit smoking and limit alcohol Weight loss Reduce stress  Medications   Oral medicines like Viagra(sildenafil) or Cialis(tadalafil).  These are generic and affordable, the lowest price is at [email or web address].  These must be taken about an hour before sexual activity, and still require sexual stimulation to get an erection Side effects can include stuffy nose, headache, muscle pain, or changes in vision There is no risk of heart attack or stroke when medications are taken as directed These medications cannot be taken with nitrates for chest pain  Other Treatments    Penile injections-a medicine is injected directly into the penis to give you an immediate erection Devices like pump vacuum systems that help  create an erection Surgery: Penile prosthesis for patients with no improvement with medicines or injections who are still interested in sexual activity.  Visit [email or web address] for more details.  Dr. Lovie is a urologist in [facility] with [facility] who performs penile prosthesis surgeries Counseling or Therapy    If ED is caused by stress, anxiety, or relationship issues, talking to a counselor or therapist can help.

## 2024-06-26 NOTE — Progress Notes (Signed)
 06/26/24 3:17 PM   Devon Rios Jun 25, 1966 [id number]  CC: Erectile dysfunction  HPI: 58 year old male with CKD stage IV who is referred for ED treatment options.  He is on isosorbide  mononitrate daily.  This has been a medication since 2023 when he was hospitalized, however he has lost over 150 pounds since that time.  His blood pressure today is 99/50.  He is able to get some erections still, but having trouble maintaining and with penetration, and interested in other options.   PMH: Past Medical History:  Diagnosis Date   Arthritis 2017   oa left knee worse than right   Chronic kidney disease 2024   DM2 (diabetes mellitus, type 2) (HCC) 07/21/2014   H/O HAD GASTRIC SLEEVE IN 2018-LOST 150 LBS AND NOW IS UNDER CONTROL WITH DIET AND EXERCISE   Frequent headaches    weekly   Hypertension 2009   H/O BEEN OF BP MEDS 06-2018 DUE TO CONTROL -PT HAD GASTRIC SLEEVE SURGERY AND LOST 150 LBS AND NOW ITS CONTROLLED WITH DIET AND EXERCISE   OSA on CPAP 2013   Severe obesity (BMI >= 40) (HCC) 07/14/2014   Sleep apnea 2010   C-pat required    Surgical History: Past Surgical History:  Procedure Laterality Date   COLONOSCOPY WITH PROPOFOL  N/A 05/13/2020   Procedure: COLONOSCOPY WITH PROPOFOL ;  Surgeon: Therisa Bi, MD;  Location: [facility] ENDOSCOPY;  Service: Gastroenterology;  Laterality: N/A;   coloscopy  age 77   INCISION AND DRAINAGE ABSCESS N/A 09/24/2018   Procedure: INCISION AND DRAINAGE OF CYST, POSTERIOR SCALP;  Surgeon: Tye Millet, DO;  Location: [facility] ORS;  Service: General;  Laterality: N/A;   LAPAROSCOPIC GASTRIC SLEEVE RESECTION N/A 06/11/2017   Procedure: LAPAROSCOPIC GASTRIC SLEEVE RESECTION WITH UPPER ENDO;  Surgeon: Gladis Cough, MD;  Location: [facility] ORS;  Service: General;  Laterality: N/A;   left knee fluid drained and cortisone injection  03/2017   MENISCUS REPAIR Right    Right knee   VASECTOMY     Family History: Family History  Problem Relation Age of Onset    Hypertension Mother    Diabetes Mother    Asthma Sister    Cancer Maternal Grandmother        Lung   Heart attack Maternal Grandfather    Stroke Neg Hx     Social History:  reports that he has never smoked. He has never been exposed to tobacco smoke. He has never used smokeless tobacco. He reports that he does not currently use alcohol. He reports that he does not use drugs.  Physical Exam: BP 99/63 (BP Location: Left Arm, Patient Position: Sitting, Cuff Size: Large)   Pulse 78   Wt 196 lb (88.9 kg)   SpO2 99%   BMI 32.12 kg/m    Constitutional:  Alert and oriented, No acute distress. Cardiovascular: No clubbing, cyanosis, or edema. Respiratory: Normal respiratory effort, no increased work of breathing. GI: Abdomen is soft, nontender, nondistended, no abdominal masses   Assessment & Plan:   58 year old male with a number of comorbidities, however significant weight loss over the last 2 years and low normal blood pressure today.  We discussed cannot prescribe PDE 5 inhibitors in patients who are on daily nitrates.  With his excellent blood pressure, may be reasonable to stop or replace this medication, but would defer to his PCP or nephrology regarding stopping that or finding an alternative.  Very reasonable to trial Cialis as needed if he is able to come off of  the isosorbide  mononitrate daily.  Okay to prescribe Cialis 5 to 20 mg daily if cleared to stop daily isosorbide  mononitrate by PCP/nephrology  Redell Burnet, MD 06/26/2024  [facility] [address], [address] [place], [place] [address] [phone]

## 2024-08-12 ENCOUNTER — Other Ambulatory Visit: Payer: Self-pay | Admitting: Nurse Practitioner

## 2024-08-12 DIAGNOSIS — E118 Type 2 diabetes mellitus with unspecified complications: Secondary | ICD-10-CM

## 2024-08-15 ENCOUNTER — Other Ambulatory Visit: Payer: Self-pay | Admitting: Nurse Practitioner

## 2024-08-15 DIAGNOSIS — E118 Type 2 diabetes mellitus with unspecified complications: Secondary | ICD-10-CM

## 2024-12-04 ENCOUNTER — Encounter: Payer: PRIVATE HEALTH INSURANCE | Admitting: Nurse Practitioner
